# Patient Record
Sex: Female | Born: 1955
Health system: Southern US, Community
[De-identification: ages and names within clinical notes are randomized; demographics above are authoritative.]

## PROBLEM LIST (undated history)

## (undated) DIAGNOSIS — Z974 Presence of external hearing-aid: Secondary | ICD-10-CM

## (undated) DIAGNOSIS — D649 Anemia, unspecified: Secondary | ICD-10-CM

## (undated) DIAGNOSIS — R2232 Localized swelling, mass and lump, left upper limb: Secondary | ICD-10-CM

## (undated) DIAGNOSIS — Z973 Presence of spectacles and contact lenses: Secondary | ICD-10-CM

## (undated) DIAGNOSIS — Z5189 Encounter for other specified aftercare: Secondary | ICD-10-CM

## (undated) DIAGNOSIS — E559 Vitamin D deficiency, unspecified: Secondary | ICD-10-CM

## (undated) DIAGNOSIS — I10 Essential (primary) hypertension: Secondary | ICD-10-CM

## (undated) DIAGNOSIS — Z85020 Personal history of malignant carcinoid tumor of stomach: Secondary | ICD-10-CM

## (undated) DIAGNOSIS — Z860101 Personal history of adenomatous and serrated colon polyps: Secondary | ICD-10-CM

## (undated) DIAGNOSIS — Z86010 Personal history of colonic polyps: Secondary | ICD-10-CM

## (undated) DIAGNOSIS — Z87828 Personal history of other (healed) physical injury and trauma: Secondary | ICD-10-CM

## (undated) DIAGNOSIS — G4730 Sleep apnea, unspecified: Secondary | ICD-10-CM

## (undated) DIAGNOSIS — M069 Rheumatoid arthritis, unspecified: Secondary | ICD-10-CM

## (undated) DIAGNOSIS — F329 Major depressive disorder, single episode, unspecified: Secondary | ICD-10-CM

## (undated) DIAGNOSIS — E119 Type 2 diabetes mellitus without complications: Secondary | ICD-10-CM

## (undated) DIAGNOSIS — H40009 Preglaucoma, unspecified, unspecified eye: Secondary | ICD-10-CM

## (undated) DIAGNOSIS — Z85528 Personal history of other malignant neoplasm of kidney: Secondary | ICD-10-CM

## (undated) DIAGNOSIS — J45909 Unspecified asthma, uncomplicated: Secondary | ICD-10-CM

## (undated) DIAGNOSIS — F32A Depression, unspecified: Secondary | ICD-10-CM

## (undated) DIAGNOSIS — M064 Inflammatory polyarthropathy: Secondary | ICD-10-CM

## (undated) DIAGNOSIS — D61818 Other pancytopenia: Secondary | ICD-10-CM

## (undated) DIAGNOSIS — K754 Autoimmune hepatitis: Secondary | ICD-10-CM

## (undated) DIAGNOSIS — T8092XA Unspecified transfusion reaction, initial encounter: Secondary | ICD-10-CM

## (undated) DIAGNOSIS — M109 Gout, unspecified: Secondary | ICD-10-CM

## (undated) DIAGNOSIS — K219 Gastro-esophageal reflux disease without esophagitis: Secondary | ICD-10-CM

## (undated) HISTORY — DX: Sleep apnea, unspecified: G47.30

## (undated) HISTORY — DX: Vitamin D deficiency, unspecified: E55.9

## (undated) HISTORY — PX: CATARACT EXTRACTION, BILATERAL: SHX1313

## (undated) HISTORY — DX: Type 2 diabetes mellitus without complications: E11.9

## (undated) HISTORY — PX: KIDNEY SURGERY: SHX687

## (undated) HISTORY — PX: STOMACH SURGERY: SHX791

## (undated) HISTORY — PX: BREAST BIOPSY: SHX20

## (undated) HISTORY — DX: Presence of external hearing-aid: Z97.4

---
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
## 1977-02-13 HISTORY — PX: WISDOM TOOTH EXTRACTION: SHX21

## 1980-02-14 HISTORY — PX: EXCISION MORTON'S NEUROMA: SHX5013

## 1990-02-13 HISTORY — PX: TUBAL LIGATION: SHX77

## 1996-06-13 HISTORY — PX: ABDOMINAL HYSTERECTOMY: SHX81

## 2000-06-13 HISTORY — PX: LAPAROSCOPIC CHOLECYSTECTOMY: SUR755

## 2004-11-25 ENCOUNTER — Encounter: Admission: RE | Admit: 2004-11-25 | Discharge: 2004-11-25 | Payer: Self-pay | Admitting: Gastroenterology

## 2004-12-07 ENCOUNTER — Encounter: Admission: RE | Admit: 2004-12-07 | Discharge: 2004-12-07 | Payer: Self-pay | Admitting: Gastroenterology

## 2004-12-29 ENCOUNTER — Ambulatory Visit (HOSPITAL_COMMUNITY): Admission: RE | Admit: 2004-12-29 | Discharge: 2004-12-29 | Payer: Self-pay | Admitting: Urology

## 2005-02-07 ENCOUNTER — Inpatient Hospital Stay (HOSPITAL_COMMUNITY): Admission: RE | Admit: 2005-02-07 | Discharge: 2005-02-09 | Payer: Self-pay | Admitting: Urology

## 2005-02-07 ENCOUNTER — Encounter (INDEPENDENT_AMBULATORY_CARE_PROVIDER_SITE_OTHER): Payer: Self-pay | Admitting: *Deleted

## 2005-02-07 HISTORY — PX: LAPAROSCOPIC PARTIAL NEPHRECTOMY: SUR782

## 2005-08-02 ENCOUNTER — Ambulatory Visit (HOSPITAL_COMMUNITY): Admission: RE | Admit: 2005-08-02 | Discharge: 2005-08-02 | Payer: Self-pay | Admitting: Urology

## 2006-01-25 ENCOUNTER — Ambulatory Visit (HOSPITAL_COMMUNITY): Admission: RE | Admit: 2006-01-25 | Discharge: 2006-01-25 | Payer: Self-pay | Admitting: Urology

## 2006-07-24 ENCOUNTER — Ambulatory Visit (HOSPITAL_COMMUNITY): Admission: RE | Admit: 2006-07-24 | Discharge: 2006-07-24 | Payer: Self-pay | Admitting: Urology

## 2006-11-29 ENCOUNTER — Encounter: Admission: RE | Admit: 2006-11-29 | Discharge: 2006-11-29 | Payer: Self-pay | Admitting: Family Medicine

## 2006-12-31 HISTORY — PX: OTHER SURGICAL HISTORY: SHX169

## 2007-01-25 ENCOUNTER — Ambulatory Visit (HOSPITAL_COMMUNITY): Admission: RE | Admit: 2007-01-25 | Discharge: 2007-01-25 | Payer: Self-pay | Admitting: Urology

## 2007-07-26 ENCOUNTER — Ambulatory Visit (HOSPITAL_COMMUNITY): Admission: RE | Admit: 2007-07-26 | Discharge: 2007-07-26 | Payer: Self-pay | Admitting: Urology

## 2007-12-17 ENCOUNTER — Encounter: Admission: RE | Admit: 2007-12-17 | Discharge: 2007-12-17 | Payer: Self-pay | Admitting: Family Medicine

## 2008-01-28 ENCOUNTER — Ambulatory Visit (HOSPITAL_COMMUNITY): Admission: RE | Admit: 2008-01-28 | Discharge: 2008-01-28 | Payer: Self-pay | Admitting: Urology

## 2008-07-24 ENCOUNTER — Ambulatory Visit (HOSPITAL_COMMUNITY): Admission: RE | Admit: 2008-07-24 | Discharge: 2008-07-24 | Payer: Self-pay | Admitting: Urology

## 2008-12-17 ENCOUNTER — Encounter: Admission: RE | Admit: 2008-12-17 | Discharge: 2008-12-17 | Payer: Self-pay | Admitting: Family Medicine

## 2009-11-17 ENCOUNTER — Ambulatory Visit (HOSPITAL_BASED_OUTPATIENT_CLINIC_OR_DEPARTMENT_OTHER): Admission: RE | Admit: 2009-11-17 | Discharge: 2009-11-17 | Payer: Self-pay | Admitting: Orthopedic Surgery

## 2009-11-17 HISTORY — PX: OTHER SURGICAL HISTORY: SHX169

## 2009-12-02 ENCOUNTER — Emergency Department (HOSPITAL_COMMUNITY): Admission: EM | Admit: 2009-12-02 | Discharge: 2009-12-03 | Payer: Self-pay | Admitting: Emergency Medicine

## 2009-12-03 ENCOUNTER — Inpatient Hospital Stay (HOSPITAL_COMMUNITY): Admission: RE | Admit: 2009-12-03 | Discharge: 2009-12-05 | Payer: Self-pay | Admitting: Psychiatry

## 2009-12-03 ENCOUNTER — Ambulatory Visit: Payer: Self-pay | Admitting: Psychiatry

## 2010-03-05 ENCOUNTER — Encounter: Payer: Self-pay | Admitting: Family Medicine

## 2010-04-27 LAB — DIFFERENTIAL
Basophils Absolute: 0.1 10*3/uL (ref 0.0–0.1)
Basophils Relative: 1 % (ref 0–1)
Eosinophils Absolute: 0.2 10*3/uL (ref 0.0–0.7)
Eosinophils Relative: 2 % (ref 0–5)
Lymphocytes Relative: 24 % (ref 12–46)
Lymphs Abs: 3.4 10*3/uL (ref 0.7–4.0)
Monocytes Absolute: 0.7 10*3/uL (ref 0.1–1.0)
Monocytes Relative: 5 % (ref 3–12)
Neutro Abs: 9.7 10*3/uL — ABNORMAL HIGH (ref 1.7–7.7)
Neutrophils Relative %: 68 % (ref 43–77)

## 2010-04-27 LAB — URINALYSIS, ROUTINE W REFLEX MICROSCOPIC
Bilirubin Urine: NEGATIVE
Glucose, UA: NEGATIVE mg/dL
Ketones, ur: NEGATIVE mg/dL
Leukocytes, UA: NEGATIVE
Nitrite: NEGATIVE
Protein, ur: NEGATIVE mg/dL
Specific Gravity, Urine: 1.017 (ref 1.005–1.030)
Urobilinogen, UA: 0.2 mg/dL (ref 0.0–1.0)
pH: 7.5 (ref 5.0–8.0)

## 2010-04-27 LAB — BASIC METABOLIC PANEL
BUN: 12 mg/dL (ref 6–23)
CO2: 28 mEq/L (ref 19–32)
Calcium: 9.5 mg/dL (ref 8.4–10.5)
Chloride: 98 mEq/L (ref 96–112)
Creatinine, Ser: 1.04 mg/dL (ref 0.4–1.2)
GFR calc Af Amer: >60 mL/min (ref >60)
GFR calc non Af Amer: 55 mL/min — ABNORMAL LOW (ref >60)
Glucose, Bld: 135 mg/dL — ABNORMAL HIGH (ref 70–99)
Potassium: 3 mEq/L — ABNORMAL LOW (ref 3.5–5.1)
Sodium: 138 mEq/L (ref 135–145)

## 2010-04-27 LAB — URINE MICROSCOPIC-ADD ON

## 2010-04-27 LAB — RAPID URINE DRUG SCREEN, HOSP PERFORMED
Amphetamines: NOT DETECTED
Barbiturates: NOT DETECTED
Benzodiazepines: POSITIVE — AB
Cocaine: NOT DETECTED
Opiates: NOT DETECTED
Tetrahydrocannabinol: NOT DETECTED

## 2010-04-27 LAB — CBC
HCT: 37 % (ref 36.0–46.0)
Hemoglobin: 12.8 g/dL (ref 12.0–15.0)
MCH: 28.8 pg (ref 26.0–34.0)
MCHC: 34.4 g/dL (ref 30.0–36.0)
MCV: 83.6 fL (ref 78.0–100.0)
Platelets: 416 10*3/uL — ABNORMAL HIGH (ref 150–400)
RBC: 4.43 MIL/uL (ref 3.87–5.11)
RDW: 14.3 % (ref 11.5–15.5)
WBC: 14.2 10*3/uL — ABNORMAL HIGH (ref 4.0–10.5)

## 2010-04-27 LAB — ETHANOL: Alcohol, Ethyl (B): <5 mg/dL (ref 0–10)

## 2010-04-28 LAB — POCT I-STAT, CHEM 8
BUN: 9 mg/dL (ref 6–23)
Calcium, Ion: 1.16 mmol/L (ref 1.12–1.32)
Chloride: 95 mEq/L — ABNORMAL LOW (ref 96–112)
Creatinine, Ser: 1 mg/dL (ref 0.4–1.2)
Glucose, Bld: 138 mg/dL — ABNORMAL HIGH (ref 70–99)
HCT: 41 % (ref 36.0–46.0)
Hemoglobin: 13.9 g/dL (ref 12.0–15.0)
Potassium: 2.8 mEq/L — ABNORMAL LOW (ref 3.5–5.1)
Sodium: 140 mEq/L (ref 135–145)
TCO2: 36 mmol/L (ref 0–100)

## 2010-04-28 LAB — POCT HEMOGLOBIN-HEMACUE: Hemoglobin: 13 g/dL (ref 12.0–15.0)

## 2010-07-01 NOTE — Op Note (Signed)
NAMEDRUE, CAMERA              ACCOUNT NO.:  [id number]   MEDICAL RECORD NO.:  [id number]          PATIENT TYPE:  INP   LOCATION:  1405                         FACILITY:  [facility]   PHYSICIAN:  Heloise Purpura, MD      DATE OF BIRTH:  [birth year]   DATE OF PROCEDURE:  02/07/2005  DATE OF DISCHARGE:                                 OPERATIVE REPORT   PREOPERATIVE DIAGNOSIS:  Left renal mass.   POSTOPERATIVE DIAGNOSIS:  Left renal mass.   PROCEDURE:  Left laparoscopic partial nephrectomy.   SURGEON:  Dr. Heloise Purpura   ASSISTANT:  Dr. Bjorn Pippin   ANESTHESIA:  General.   ESTIMATED BLOOD LOSS:  25 mL.   SPECIMEN:  1.  Frozen section resection of resection base, negative for malignancy.  2.  Cystic left renal mass.   DRAINS:  1.  A #15 Blake drain.  2.  A 16-French Foley catheter.   INDICATION:  Ms. Laske is a 55 year old female with an incidentally  detected left renal mass.  After undergoing a negative metastatic  evaluation, we had a discussion regarding her enhancing renal mass, and she  elected to proceed with surgical resection.  Potential risks and benefits of  this procedure were discussed with the patient, and she consented.   DESCRIPTION OF PROCEDURE:  The patient was taken to the operating room, and  a general anesthetic was administered.  The patient was given preoperative  antibiotics, placed in the left modified flank position, and prepped and  draped in the usual sterile fashion.  Next,  a preoperative time out was  performed.  A site was then selected in the midline just superior to the  umbilicus for placement of the camera port.  This was placed using a  standard Hasson technique.  A 1 cm vertical incision was made in the skin  and carried down through the subcutaneous tissues with electrocautery.  The  rectus fascia was then scored, and this was noted to be just off the  midline.  The posterior rectus sheath and peritoneum was then sharply  divided,  allowing entry into the peritoneal cavity under direct vision.  Holding stitches of 0 Vicryl were then placed in the rectus fascia for the  Hasson port.  The Hasson port was then placed and secured with the holding  stitches.  A pneumoperitoneum was established, and the 30-degree lens was  used to inspect the abdomen.  There was no evidence of any intra-abdominal  injuries.  There was noted to be an adhesion between the omentum and  abdominal wall in the left upper quadrant.  Attention then turned to the  left lower quadrant.  A 12 mm port was placed under direct vision and  without difficulty. An additional 5 mm port was then placed just to the left  of midline, midway between the xiphoid process and the camera port.  This  was placed after the aforementioned adhesion was taken down with the  laparoscopic scissors.  An additional 5 mm port was then placed in the left  lateral abdominal wall.  All ports were placed under  direct vision and  without difficulty.  The white line of Toldt was then incised along the  length of the descending colon with the aid of the harmonic scalpel,  allowing the colon to be reflected medially.  The space between the colonic  mesentery and the anterior Gerota's fascia was then bluntly developed until  the ureter was identified.  The space between the ureter and the psoas  fascia was then developed, allowing the ureter to be lifted anteriorly.  Dissection then continued up towards the renal hilum where the renal vein  was encountered.  The gonadal vein was also identified and was able to be  reflected up with the kidney.  There were then noted to be 2 renal arteries  posterior to the renal vein.  These arteries were also inferior to the renal  vein.  These arteries did correspond to the patient's CT scan.  Attention  then returned to the kidney which was further mobilized superiorly and  laterally.  At this point, the harmonic scalpel was used to enter Gerota's   fascia anteriorly, allowing the perinephric fat to be mobilized off the  renal parenchyma.  The left renal mass was then identified in a position  anteriorly and laterally just below the level of the renal hilum.  This  corresponded to the lesion seen on the patient's CT scan.  The perinephric  fat was then removed all around and adjacent to this mass.  The harmonic  scalpel was then used to score the renal capsule in a circular fashion  around the mass.  The patient was administered 12.5 grams of intravenous  mannitol.  Following a period of approximately 10 minutes, laparoscopic  bulldog clamps were then placed on each of the renal arteries.  This  demonstrated appropriate renal ischemia, and laparoscopic scissors were then  used to incise the renal parenchyma around the renal mass, and it was  removed.  This mass was noted to be somewhat cystic.  After its excision, it  was placed up into the left upper quadrant above the spleen.  There appeared  to be minimal bleeding from the resection site at this point.  A deep margin  was then taken at the base of the resection site and sent off for frozen  analysis.  This demonstrated no malignancy.  Then 2-0 chromic figure-of-  eight sutures were placed in the base of the resection site.  FloSeal was  then applied to the resection base, and a Surgicel bolster was used to hold  pressure on the FloSeal.  Then 2-0 chromic sutures were used to perform  horizontal mattress sutures to secure the bolster in place.  The remaining  FloSeal was then placed over the top of the renal defect, and a free piece  of Surgicel was placed over this area.  At this time, the laparoscopic  bulldog clamps were removed.  Total warm ischemia time was 35 minutes.  The  resection site was then examined, and there did not appear to be any  bleeding.  The perinephric fat was then reapproximated over the repaired defect.  At this time, the Endopouch retrieval bag was used to  retrieve the  renal mass specimen, and it was removed via the camera port site intact  within the retrieval bag.  Zero Vicryl sutures were then placed through the  12 mm port sites for port site closure with the aid of the suture passer  device.  A #15 Blake drain was then placed through  the lateral 5 mm port  site and appropriately positioned in the retroperitoneum.  It was then  secured to the skin with a nylon suture.  All remaining ports were then  removed under direct vision.  The patient appeared to tolerate the procedure  well and without complications.  All sponge and needle counts were correct  x2 at the end of the procedure.  Quarter-percent Marcaine was then used to  inject all incision sites which were then reapproximated to the skin with 4-  0 Vicryl subcuticular closures.  Steri-Strips and sterile dressings were  applied.  The patient appeared to tolerate the procedure well and was able  to be extubated and transferred to the recovery unit in satisfactory  condition.           ______________________________  Heloise Purpura, MD  Electronically Signed     LB/MEDQ  D:  02/07/2005  T:  02/07/2005  Job:  [id number]

## 2010-07-01 NOTE — H&P (Signed)
[name], [name]              ACCOUNT NO.:  [id number]   MEDICAL RECORD NO.:  [id number]          PATIENT TYPE:  INP   LOCATION:  0004                         FACILITY:  [facility]   PHYSICIAN:  Heloise Purpura, MD      DATE OF BIRTH:  Jan 21, 1956   DATE OF ADMISSION:  02/07/2005  DATE OF DISCHARGE:                                HISTORY & PHYSICAL   CHIEF COMPLAINT:  Left renal mass.   HISTORY OF PRESENT ILLNESS:  [name] is a 55 year old female who recently  had left sided back pain. She subsequently underwent a renal ultrasound for  this pain as well as elevated liver function tests.  Her renal ultrasound  demonstrated some fatty change and a slightly enlarged liver.  Incidentally,  it also detected a 2 cm solid mass in the inner polar region of the left  kidney.  The patient therefore underwent a subsequent CT scan to fully  characterize this lesion and it did appear to enhance from 33 to 53  Hounsfield units. This was consistent with an enhancing renal mass worrisome  for possible renal malignancy.  The patient was initially evaluated by Dr.  Annabell Howells and then seen in consultation by myself for a possible laparoscopic  partial nephrectomy.  She also underwent a full metastatic evaluation that  was negative.  Her elevated liver function tests have been evaluated and are  thought to be secondary to autoimmune hepatitis.  Furthermore, she denies  any bleeding tendencies and underwent coagulation studies that were, for the  most part, within normal limits.   PAST MEDICAL HISTORY:  1.  History of elevated liver function tests thought to be secondary to      autoimmune hepatitis.  2.  Hypertension.  3.  Uterine fibroids.  4.  Cholelithiasis.  5.  Degenerative joint disease in the cervical spine.   PAST SURGICAL HISTORY:  1.  Laparoscopic cholecystectomy.  2.  Multiple breast biopsies.  3.  Trans-abdominal hysterectomy.  4.  Removal of foot neuroma.  5.  Removal of wisdom teeth.  6.  Tonsillectomy.  7.  Surgery for a head injury in the past.   MEDICATIONS:  1.  Hyzaar.  2.  Cenestin.  3.  Omeprazole.  4.  Dayquil.  5.  Benadryl.  6.  Tylenol.  7.  Patient also takes a medication for chronic diarrhea that she cannot      currently remember.   ALLERGIES:  MYCIN'S, ALOE and BEE STINGS which all cause swelling.  The  patient also has adverse reactions with PENICILLIN, namely yeast infections.  She also has problems with nausea secondary to morphine.   FAMILY HISTORY:  1.  Breast cancer.  2.  Hypertension.  3.  Type 2 diabetes.  4.  Hypercholesterolemia.  5.  Coronary artery disease.   SOCIAL HISTORY:  The patient works as a Solicitor.  She is married.  She denies  a history of tobacco use.  She drinks approximately 1 glass of alcohol per  day.   PHYSICAL EXAMINATION:  CONSTITUTIONAL:  The patient is well-nourished and  well-developed and in no acute distress.  HEENT:  Normocephalic, atraumatic. Oropharynx is clear.  NECK:  No jugular venous distention, thyromegaly or carotid bruits.  LYMPH NODES:  Survey negative.  CARDIOVASCULAR:  Regular rate and rhythm without obvious murmurs.  LUNGS:  Clear bilaterally.  ABDOMEN:  Soft, nontender, nondistended without abdominal masses or bruits.  BACK:  No costovertebral angle tenderness.  GENITOURINARY:  Deferred.  EXTREMITIES:  No edema.  NEUROLOGICAL:  Grossly intact.   IMPRESSION:  Incidentally detected left renal mass worrisome for malignancy.   PLAN:  She will undergo a left laparoscopic partial nephrectomy.  The  patient will then be admitted to the hospital for routine postoperative  care.           ______________________________  Heloise Purpura, MD  Electronically Signed     LB/MEDQ  D:  02/07/2005  T:  02/07/2005  Job:  [id number]

## 2010-07-01 NOTE — Discharge Summary (Signed)
[name], [name]              ACCOUNT NO.:  [id number]   MEDICAL RECORD NO.:  [id number]          PATIENT TYPE:  INP   LOCATION:  1405                         [facility]   PHYSICIAN:  Heloise Purpura, MD      DATE OF BIRTH:  [birth year]   DATE OF ADMISSION:  02/07/2005  DATE OF DISCHARGE:  02/09/2005                                 DISCHARGE SUMMARY   ADMISSION DIAGNOSIS:  Left renal mass.   DISCHARGE DIAGNOSIS:  Left renal mass.   HISTORY:  For full details, please see admission history and physical.  Briefly, [name] is a 55 year old female who was found to have an  incidentally detected 2 cm left renal mass that was enhancing and worrisome  for renal malignancy. A metastatic evaluation was performed and found to be  negative. She was therefore counseled regarding management options and  elected to proceed with a laparoscopic partial nephrectomy.   HOSPITAL COURSE:  After undergoing an uneventful left laparoscopic partial  nephrectomy, the patient was able to be transferred to a regular hospital  room following recovery from anesthesia. She was initially maintained on  bedrest and on postoperative day #1 was able to begin ambulating. She was  placed on a clear liquid diet and by postoperative day #2, she was  ambulating well and did have return of bowel function. Her diet was advanced  throughout the day and her retroperitoneal drain was removed after having  minimal output. Her pain was otherwise controlled well with oral pain  medication and she was able to be discharged home in good condition.   DISPOSITION:  Home.   DISCHARGE MEDICATIONS:  The patient has been given a prescription for  Vicodin to take as needed for pain. She has also been instructed to take  Colace as needed as a stool softener. She has been instructed that she may  otherwise resume her regular home medications excepting for any aspirin or  nonsteroidal anti-inflammatory drugs for a period of  time 2 weeks.   DISCHARGE INSTRUCTIONS:  The patient was instructed to refrain from any  heavy lifting or strenuous activity. She was encouraged to be ambulatory.  She is instructed on signs and symptoms of wound infection and told to call  should she have any problems.   FOLLOW-UP APPOINTMENTS:  An appointment will made for the patient to follow-  up with me next week for a postoperative check. We will plan to review the  patient's pathology at that time.           ______________________________  Heloise Purpura, MD  Electronically Signed     LB/MEDQ  D:  02/09/2005  T:  02/10/2005  Job:  [id number]   cc:   Excell Seltzer. Annabell Howells, M.D.  Fax: [phone]   Magnus Sinning. Rice, M.D.  Fax: [phone]

## 2011-06-05 ENCOUNTER — Other Ambulatory Visit: Payer: Self-pay | Admitting: Family Medicine

## 2011-06-05 DIAGNOSIS — Z1231 Encounter for screening mammogram for malignant neoplasm of breast: Secondary | ICD-10-CM

## 2011-06-15 ENCOUNTER — Ambulatory Visit
Admission: RE | Admit: 2011-06-15 | Discharge: 2011-06-15 | Disposition: A | Discharge status: Home / Self Care | Payer: BC Managed Care – PPO | Source: Ambulatory Visit | Attending: Family Medicine | Admitting: Family Medicine

## 2011-06-15 DIAGNOSIS — Z1231 Encounter for screening mammogram for malignant neoplasm of breast: Secondary | ICD-10-CM

## 2012-03-25 ENCOUNTER — Other Ambulatory Visit: Payer: Self-pay | Admitting: Gastroenterology

## 2012-03-27 ENCOUNTER — Inpatient Hospital Stay (HOSPITAL_COMMUNITY)
Admission: EM | Admit: 2012-03-27 | Discharge: 2012-03-28 | DRG: 812 | Disposition: A | Discharge status: Home / Self Care | Payer: Commercial Managed Care - PPO | Attending: Family Medicine | Admitting: Family Medicine

## 2012-03-27 ENCOUNTER — Encounter (HOSPITAL_COMMUNITY): Payer: Self-pay | Admitting: *Deleted

## 2012-03-27 DIAGNOSIS — M129 Arthropathy, unspecified: Secondary | ICD-10-CM | POA: Diagnosis present

## 2012-03-27 DIAGNOSIS — Z79899 Other long term (current) drug therapy: Secondary | ICD-10-CM

## 2012-03-27 DIAGNOSIS — M109 Gout, unspecified: Secondary | ICD-10-CM | POA: Diagnosis present

## 2012-03-27 DIAGNOSIS — IMO0002 Reserved for concepts with insufficient information to code with codable children: Secondary | ICD-10-CM

## 2012-03-27 DIAGNOSIS — I10 Essential (primary) hypertension: Secondary | ICD-10-CM | POA: Diagnosis present

## 2012-03-27 DIAGNOSIS — K754 Autoimmune hepatitis: Secondary | ICD-10-CM | POA: Diagnosis present

## 2012-03-27 DIAGNOSIS — Z85528 Personal history of other malignant neoplasm of kidney: Secondary | ICD-10-CM

## 2012-03-27 DIAGNOSIS — M069 Rheumatoid arthritis, unspecified: Secondary | ICD-10-CM | POA: Diagnosis present

## 2012-03-27 DIAGNOSIS — K922 Gastrointestinal hemorrhage, unspecified: Secondary | ICD-10-CM

## 2012-03-27 DIAGNOSIS — D649 Anemia, unspecified: Principal | ICD-10-CM | POA: Diagnosis present

## 2012-03-27 HISTORY — DX: Essential (primary) hypertension: I10

## 2012-03-27 HISTORY — DX: Rheumatoid arthritis, unspecified: M06.9

## 2012-03-27 HISTORY — DX: Autoimmune hepatitis: K75.4

## 2012-03-27 HISTORY — DX: Gout, unspecified: M10.9

## 2012-03-27 LAB — CBC WITH DIFFERENTIAL/PLATELET
Basophils Relative: 1 % (ref 0–1)
HCT: 21.2 % — ABNORMAL LOW (ref 36.0–46.0)
Hemoglobin: 7.7 g/dL — ABNORMAL LOW (ref 12.0–15.0)
Lymphs Abs: 2.2 10*3/uL (ref 0.7–4.0)
MCH: 33 pg (ref 26.0–34.0)
MCHC: 36.3 g/dL — ABNORMAL HIGH (ref 30.0–36.0)
Monocytes Absolute: 0.2 10*3/uL (ref 0.1–1.0)
Monocytes Relative: 4 % (ref 3–12)
Neutro Abs: 1.3 10*3/uL — ABNORMAL LOW (ref 1.7–7.7)
RBC: 2.33 MIL/uL — ABNORMAL LOW (ref 3.87–5.11)

## 2012-03-27 LAB — COMPREHENSIVE METABOLIC PANEL
Albumin: 3.8 g/dL (ref 3.5–5.2)
Alkaline Phosphatase: 119 U/L — ABNORMAL HIGH (ref 39–117)
BUN: 16 mg/dL (ref 6–23)
Chloride: 99 mEq/L (ref 96–112)
Creatinine, Ser: 0.75 mg/dL (ref 0.50–1.10)
GFR calc Af Amer: >90 mL/min (ref >90)
GFR calc non Af Amer: >90 mL/min (ref >90)
Glucose, Bld: 118 mg/dL — ABNORMAL HIGH (ref 70–99)
Total Bilirubin: 0.4 mg/dL (ref 0.3–1.2)

## 2012-03-27 LAB — URINALYSIS, ROUTINE W REFLEX MICROSCOPIC
Glucose, UA: NEGATIVE mg/dL
Ketones, ur: NEGATIVE mg/dL
Leukocytes, UA: NEGATIVE
Nitrite: NEGATIVE
Protein, ur: NEGATIVE mg/dL
Urobilinogen, UA: 0.2 mg/dL (ref 0.0–1.0)

## 2012-03-27 LAB — ABO/RH: ABO/RH(D): A POS

## 2012-03-27 MED ORDER — ACETAMINOPHEN 325 MG PO TABS: 650.0000 mg | ORAL_TABLET | Freq: Four times a day (QID) | ORAL | Status: DC | PRN

## 2012-03-27 MED ORDER — ZOLPIDEM TARTRATE 5 MG PO TABS: 5.0000 mg | ORAL_TABLET | Freq: Every evening | ORAL | Status: DC | PRN

## 2012-03-27 MED ORDER — SODIUM CHLORIDE 0.9 % IJ SOLN
3.0000 mL | Freq: Two times a day (BID) | INTRAMUSCULAR | Status: DC
  Administered 2012-03-27 – 2012-03-28 (×2): 3 mL via INTRAVENOUS

## 2012-03-27 MED ORDER — ONDANSETRON HCL 4 MG/2ML IJ SOLN: 4.0000 mg | Freq: Four times a day (QID) | INTRAMUSCULAR | Status: DC | PRN

## 2012-03-27 MED ORDER — ACETAMINOPHEN 650 MG RE SUPP: 650.0000 mg | Freq: Four times a day (QID) | RECTAL | Status: DC | PRN

## 2012-03-27 MED ORDER — PANTOPRAZOLE SODIUM 40 MG PO TBEC
40.0000 mg | DELAYED_RELEASE_TABLET | Freq: Every day | ORAL | Status: DC
  Administered 2012-03-27 – 2012-03-28 (×2): 40 mg via ORAL
  Filled 2012-03-27 (×2): qty 1

## 2012-03-27 MED ORDER — ACETAMINOPHEN 325 MG PO TABS
650.0000 mg | ORAL_TABLET | Freq: Once | ORAL | Status: AC
  Administered 2012-03-27: 650 mg via ORAL
  Filled 2012-03-27: qty 2

## 2012-03-27 MED ORDER — ONDANSETRON HCL 8 MG PO TABS
4.0000 mg | ORAL_TABLET | Freq: Four times a day (QID) | ORAL | Status: DC | PRN
  Filled 2012-03-27: qty 0.5

## 2012-03-27 MED ORDER — SODIUM CHLORIDE 0.9 % IV SOLN
INTRAVENOUS | Status: DC
  Administered 2012-03-27: 23:00:00 via INTRAVENOUS

## 2012-03-27 MED ORDER — PREDNISONE 5 MG PO TABS
5.0000 mg | ORAL_TABLET | Freq: Every day | ORAL | Status: DC
  Administered 2012-03-27 – 2012-03-28 (×2): 5 mg via ORAL
  Filled 2012-03-27 (×2): qty 1

## 2012-03-27 NOTE — ED Notes (Signed)
Pt ambulated to restroom with no issues. Gave urine sample.

## 2012-03-27 NOTE — ED Provider Notes (Signed)
History     CSN: [id number]  Arrival date & time 03/27/12  1448   First MD Initiated Contact with Patient 03/27/12 1459      Chief Complaint  Patient presents with  . Fatigue  . Anemia    (Consider location/radiation/quality/duration/timing/severity/associated sxs/prior treatment) HPI  The patient presents with concerns of fatigue.  Notably, the patient has no complaints at rest, but states that with exertion, minimal activity she has lightheadedness, ED fatigability.  There is no associated pain, nor significant dyspnea with exertion, and none of these at rest. The patient is currently being evaluated for anemia, and is scheduled for colonoscopy tomorrow. She denies new changes in bowel habits, or any emesis.  At baseline the patient has Hemoccult-positive stool, as recently evaluated by her gastroenterologist. She denies other new focal changes, such as disorientation, confusion, syncope, visual changes, chest pain, belly pain, melena or frank red blood per rectum.  Past Medical History  Diagnosis Date  . Arthritis   . Autoimmune hepatitis   . Hypertension   . Gout     History reviewed. No pertinent past surgical history.  History reviewed. No pertinent family history.  History  Substance Use Topics  . Smoking status: Not on file  . Smokeless tobacco: Not on file  . Alcohol Use: Yes     Comment: occ    OB History   Grav Para Term Preterm Abortions TAB SAB Ect Mult Living                  Review of Systems  Constitutional:       Per HPI, otherwise negative  HENT:       Per HPI, otherwise negative  Respiratory:       Per HPI, otherwise negative  Cardiovascular:       Per HPI, otherwise negative  Gastrointestinal: Negative for vomiting.  Endocrine:       Negative aside from HPI  Genitourinary:       Neg aside from HPI   Musculoskeletal:       Per HPI, otherwise negative  Skin: Negative.   Neurological: Negative for syncope.    Allergies  Aspirin;  Morphine and related; Other; Penicillins; Prozac; and Aloe  Home Medications   Current Outpatient Rx  Name  Route  Sig  Dispense  Refill  . adalimumab (HUMIRA) 40 MG/0.8ML injection   Subcutaneous   Inject 40 mg into the skin once a week.         Marland Kitchen allopurinol (ZYLOPRIM) 300 MG tablet   Oral   Take 300 mg by mouth daily.         Marland Kitchen azaTHIOprine (IMURAN) 50 MG tablet   Oral   Take 50-100 mg by mouth daily. 2 tablets in the morning 1 tablet in the evening         . colchicine 0.6 MG tablet   Oral   Take 0.6 mg by mouth daily.         . cyclobenzaprine (FLEXERIL) 10 MG tablet   Oral   Take 10 mg by mouth 3 (three) times daily as needed for muscle spasms.         Marland Kitchen esomeprazole (NEXIUM) 40 MG capsule   Oral   Take 40 mg by mouth 2 (two) times daily.         . furosemide (LASIX) 20 MG tablet   Oral   Take 20 mg by mouth daily.         Marland Kitchen losartan-hydrochlorothiazide (  HYZAAR) 100-25 MG per tablet   Oral   Take 1 tablet by mouth every morning.         . potassium chloride SA (K-DUR,KLOR-CON) 20 MEQ tablet   Oral   Take 20 mEq by mouth daily.         . predniSONE (DELTASONE) 5 MG tablet   Oral   Take 5 mg by mouth daily.           BP 132/77  Pulse 118  Temp(Src) 98.2 F (36.8 C) (Oral)  Resp 18  SpO2 96%  Physical Exam  Nursing note and vitals reviewed. Constitutional: She is oriented to person, place, and time. She appears well-developed and well-nourished. No distress.  HENT:  Head: Normocephalic and atraumatic.  Eyes: Conjunctivae and EOM are normal.  Cardiovascular: Normal rate and regular rhythm.   Pulmonary/Chest: Effort normal and breath sounds normal. No stridor. No respiratory distress.  Abdominal: She exhibits no distension.  Musculoskeletal: She exhibits no edema.  Neurological: She is alert and oriented to person, place, and time. No cranial nerve deficit.  Skin: Skin is warm and dry.  Psychiatric: She has a normal mood and  affect.   Patient defers rectal exam - reasonable as she has known occult positive samples within the past week.  ED Course  Procedures (including critical care time)  Labs Reviewed  CBC WITH DIFFERENTIAL  COMPREHENSIVE METABOLIC PANEL  URINALYSIS, ROUTINE W REFLEX MICROSCOPIC   No results found.   No diagnosis found.  I reviewed the labs with the patient and her husband.  Given the decrease in hemoglobin to 7.7, down from 8.2 2 days ago, and 8.6 10 days ago, she will be transfused for symptomatic anemia  MDM  This patient presents one day prior to a planned colonoscopy for evaluation of anemia, GI bleed, now with increasing fatigue.  On exam the patient is mentating well, though she is at rest.  Given the patient's endorsement of increasingly symptomatic changes, her decreased hemoglobin, she will receive blood transfusion, be admitted for observation for this process.        Gerhard Munch, MD 03/27/12 [phone]
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
## 2012-03-27 NOTE — ED Notes (Signed)
Lab at bedside

## 2012-03-27 NOTE — ED Notes (Signed)
Pt in nad, skin warm and dry, resp e/u. Pt denies itchiness at PIV site. Pt negative for bld transfusion reaction.

## 2012-03-27 NOTE — ED Notes (Signed)
Pt reports already being scheduled for colonoscopy tomorrow due to low hemoglobin but having increase in fatigue and is afraid that hgb has decreased more. No acute distress noted at this time.
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
## 2012-03-28 DIAGNOSIS — D649 Anemia, unspecified: Secondary | ICD-10-CM

## 2012-03-28 DIAGNOSIS — K922 Gastrointestinal hemorrhage, unspecified: Secondary | ICD-10-CM

## 2012-03-28 DIAGNOSIS — I10 Essential (primary) hypertension: Secondary | ICD-10-CM

## 2012-03-28 LAB — TYPE AND SCREEN
ABO/RH(D): A POS
Unit division: 0

## 2012-03-28 LAB — CBC
HCT: 25.3 % — ABNORMAL LOW (ref 36.0–46.0)
Hemoglobin: 9.2 g/dL — ABNORMAL LOW (ref 12.0–15.0)
MCHC: 36.4 g/dL — ABNORMAL HIGH (ref 30.0–36.0)
MCV: 89.1 fL (ref 78.0–100.0)
RDW: 19.1 % — ABNORMAL HIGH (ref 11.5–15.5)

## 2012-03-28 LAB — BASIC METABOLIC PANEL
BUN: 14 mg/dL (ref 6–23)
Chloride: 101 mEq/L (ref 96–112)
Creatinine, Ser: 0.82 mg/dL (ref 0.50–1.10)
GFR calc non Af Amer: 79 mL/min — ABNORMAL LOW (ref >90)
Glucose, Bld: 121 mg/dL — ABNORMAL HIGH (ref 70–99)
Potassium: 3.1 mEq/L — ABNORMAL LOW (ref 3.5–5.1)

## 2012-03-28 NOTE — H&P (Signed)
FMTS Attending Daily Note: Jeylin Woodmansee MD [phone] pager office [phone] I  have seen and examined this patient, reviewed their chart. I have discussed this patient with the resident. I agree with the resident's findings, assessment and care plan. 
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
## 2012-03-28 NOTE — Discharge Summary (Signed)
Family Medicine Teaching Service  Discharge Note : Attending Maika Kaczmarek MD Pager [phone] Office [phone] I have seen and examined this patient, reviewed their chart and discussed discharge planning wit the resident at the time of discharge. I agree with the discharge plan as above.  

## 2012-03-28 NOTE — Progress Notes (Signed)
FMTS Attending Daily Note: Denny Levy MD [phone] pager office [phone] I  have seen and examined this patient, reviewed their chart. I have discussed this patient with the resident. I agree with the resident's findings, assessment and care plan.Patient stable and her GI physicians plan outpatient colonoscopy. Improved symptomatically.

## 2012-03-28 NOTE — Progress Notes (Signed)
Family Medicine Teaching Service Daily Progress Note Service Pager: [phone]  Subjective: Patient says she is feeling much better, says she has less fatigue when up walking.  She says she had a bowel movement and did not see any blood in it.    Objective: Vital signs in last 24 hours: Filed Vitals:   03/27/12 2315 03/28/12 0015 03/28/12 0100 03/28/12 0623  BP: 124/77 112/71 115/72 99/68  Pulse: 84 88 87 88  Temp: 97.8 F (36.6 C) 98.9 F (37.2 C) 98.3 F (36.8 C) 98.3 F (36.8 C)  TempSrc: Oral Oral Oral Oral  Resp: 18 18 18 18   Height:      Weight:    247 lb 3.2 oz (112.129 kg)  SpO2: 96% 95% 94% 95%   Weight change:   Intake/Output Summary (Last 24 hours) at 03/28/12 0814 Last data filed at 03/27/12 2200  Gross per 24 hour  Intake  262.5 ml  Output      0 ml  Net  262.5 ml   General appearance: alert, cooperative and no distress, obese Eyes: PERRL Throat: oral mucosa moist Lungs: clear to auscultation bilaterally  Heart: regular rate and rhythm, S1, S2 normal, no murmur, click, rub or gallop  Abdomen: obese, soft, non-tender; bowel sounds normal; no masses, no organomegaly  Extremities: extremities normal, atraumatic, no cyanosis or edema  Pulses: 2+ and symmetric  Lab Results:  Recent Labs  03/27/12 1520 03/28/12 0315  WBC 3.8* 3.3*  HGB 7.7* 9.2*  HCT 21.2* 25.3*  PLT 339 268    Recent Labs  03/27/12 1520 03/28/12 0315  NA 137 138  K 3.5 3.1*  CL 99 101  CO2 24 26  GLUCOSE 118* 121*  BUN 16 14  CREATININE 0.75 0.82  CALCIUM 10.0 9.2   Medications: I have reviewed the patient's current medications. Scheduled Meds: . pantoprazole  40 mg Oral Daily  . predniSONE  5 mg Oral Daily  . sodium chloride  3 mL Intravenous Q12H   Continuous Infusions:  PRN Meds:.acetaminophen, acetaminophen, ondansetron (ZOFRAN) IV, ondansetron, zolpidem Assessment/Plan: [name] is a 57 y.o. female with PMHx of RA, autoimmune hepatitis, and heme + stools who  presents with symptomatic anemia, concern slow for GI bleed:   # Anemia:  - Hemoglobin improved to 9.2 with transfusion, and patient is symptomatically improved.  - I called and discussed patient with Dr. Dorena Cookey of [facility] and he felt that since patient is not having signs of hemorrhage she could be discharged and is stable to have outpatient colonoscopy re-scheduled.   #Hx of HTN: relatively low BP's will hold home BP medications at discharge, these may need to be re-started later #Autoimmune hepatitis and Rheumatoid arthritis: continue home medications # FEN/GI: Heart healthy diet # Code Status: DNR #PPx: SQ Heparin  #Disposition: Discharge home with close follow up at [facility].    LOS: 1 day   Fleming Prill 03/28/2012, 8:14 AM

## 2012-03-28 NOTE — Progress Notes (Signed)
Utilization review completed.  

## 2012-04-01 ENCOUNTER — Encounter (HOSPITAL_COMMUNITY): Payer: BC Managed Care – PPO

## 2012-04-03 ENCOUNTER — Other Ambulatory Visit: Payer: Self-pay | Admitting: Gastroenterology

## 2012-04-04 ENCOUNTER — Other Ambulatory Visit (HOSPITAL_COMMUNITY): Payer: Self-pay | Admitting: Gastroenterology

## 2012-04-04 DIAGNOSIS — R1900 Intra-abdominal and pelvic swelling, mass and lump, unspecified site: Secondary | ICD-10-CM

## 2012-04-09 ENCOUNTER — Ambulatory Visit (HOSPITAL_COMMUNITY)
Admission: RE | Admit: 2012-04-09 | Discharge: 2012-04-09 | Disposition: A | Discharge status: Home / Self Care | Payer: Commercial Managed Care - PPO | Source: Ambulatory Visit | Attending: Gastroenterology | Admitting: Gastroenterology

## 2012-04-09 DIAGNOSIS — R1900 Intra-abdominal and pelvic swelling, mass and lump, unspecified site: Secondary | ICD-10-CM | POA: Insufficient documentation

## 2012-04-09 DIAGNOSIS — D509 Iron deficiency anemia, unspecified: Secondary | ICD-10-CM | POA: Insufficient documentation

## 2012-04-09 MED ORDER — IOHEXOL 300 MG/ML  SOLN
100.0000 mL | Freq: Once | INTRAMUSCULAR | Status: AC | PRN
  Administered 2012-04-09: 100 mL via INTRAVENOUS

## 2012-04-09 MED ORDER — IOHEXOL 300 MG/ML  SOLN: 100.0000 mL | Freq: Once | INTRAMUSCULAR | Status: AC | PRN

## 2012-04-11 ENCOUNTER — Telehealth: Payer: Self-pay | Admitting: Internal Medicine

## 2012-04-11 ENCOUNTER — Encounter (HOSPITAL_COMMUNITY): Payer: Self-pay | Admitting: *Deleted

## 2012-04-11 NOTE — Telephone Encounter (Signed)
C/D 04/11/12 for appt. 04/23/12 °

## 2012-04-11 NOTE — Pre-Procedure Instructions (Signed)
Your procedure is scheduled on:Wednesday, April 17, 2012 Report to [facility] Admitting [id number] Call this number if you have problems morning of your procedure:[phone]  Follow all bowel prep instructions per your doctor's orders.  Do not eat or drink anything after midnight the night before your procedure. You may brush your teeth, rinse out your mouth, but no water, no food, no chewing gum, no mints, no candies, no chewing tobacco.     Take these medicines the morning of your procedure with A SIP OF WATER:Nexium and Prednisone   Please make arrangements for a responsible person to drive you home after the procedure. You cannot go home by cab/taxi. We recommend you have someone with you at home the first 24 hours after your procedure. Driver for procedure is spouse [name] [phone]  LEAVE ALL VALUABLES, JEWELRY, BILLFOLD AT HOME.  NO DENTURES, CONTACT LENSES ALLOWED IN THE ENDOSCOPY ROOM.   YOU MAY WEAR DEODORANT, PLEASE REMOVE ALL JEWELRY, WATCHES RINGS, BODY PIERCINGS AND LEAVE AT HOME.   WOMEN: NO MAKE-UP, LOTIONS PERFUMES

## 2012-04-11 NOTE — Telephone Encounter (Signed)
S/W pt in re NP appt 03/11 @ 1:30 w/Dr. Arbutus Ped. Referring Dr. Randa Evens Dx- Anemia Welcome packet emailed.

## 2012-04-12 ENCOUNTER — Encounter (HOSPITAL_COMMUNITY): Payer: Self-pay | Admitting: Pharmacy Technician

## 2012-04-15 ENCOUNTER — Encounter (HOSPITAL_COMMUNITY): Payer: Commercial Managed Care - PPO

## 2012-04-17 ENCOUNTER — Encounter (HOSPITAL_COMMUNITY)
Admission: RE | Disposition: A | Discharge status: Home / Self Care | Payer: Self-pay | Source: Ambulatory Visit | Attending: Gastroenterology

## 2012-04-17 ENCOUNTER — Ambulatory Visit (HOSPITAL_COMMUNITY)
Admission: RE | Admit: 2012-04-17 | Discharge: 2012-04-17 | Disposition: A | Discharge status: Home / Self Care | Payer: Commercial Managed Care - PPO | Source: Ambulatory Visit | Attending: Gastroenterology | Admitting: Gastroenterology

## 2012-04-17 ENCOUNTER — Encounter (HOSPITAL_COMMUNITY): Payer: Self-pay

## 2012-04-17 ENCOUNTER — Encounter (HOSPITAL_COMMUNITY): Payer: Self-pay | Admitting: Anesthesiology

## 2012-04-17 ENCOUNTER — Ambulatory Visit (HOSPITAL_COMMUNITY): Payer: Commercial Managed Care - PPO | Admitting: Anesthesiology

## 2012-04-17 DIAGNOSIS — D649 Anemia, unspecified: Secondary | ICD-10-CM | POA: Insufficient documentation

## 2012-04-17 DIAGNOSIS — Z85528 Personal history of other malignant neoplasm of kidney: Secondary | ICD-10-CM | POA: Insufficient documentation

## 2012-04-17 DIAGNOSIS — I10 Essential (primary) hypertension: Secondary | ICD-10-CM | POA: Insufficient documentation

## 2012-04-17 DIAGNOSIS — M069 Rheumatoid arthritis, unspecified: Secondary | ICD-10-CM | POA: Insufficient documentation

## 2012-04-17 DIAGNOSIS — M109 Gout, unspecified: Secondary | ICD-10-CM | POA: Insufficient documentation

## 2012-04-17 DIAGNOSIS — K754 Autoimmune hepatitis: Secondary | ICD-10-CM | POA: Insufficient documentation

## 2012-04-17 DIAGNOSIS — K319 Disease of stomach and duodenum, unspecified: Secondary | ICD-10-CM | POA: Insufficient documentation

## 2012-04-17 HISTORY — DX: Gastro-esophageal reflux disease without esophagitis: K21.9

## 2012-04-17 HISTORY — PX: EUS: SHX5427

## 2012-04-17 SURGERY — ESOPHAGEAL ENDOSCOPIC ULTRASOUND (EUS) RADIAL
Anesthesia: Monitor Anesthesia Care

## 2012-04-17 MED ORDER — FENTANYL CITRATE 0.05 MG/ML IJ SOLN
INTRAMUSCULAR | Status: DC | PRN
  Administered 2012-04-17: 100 ug via INTRAVENOUS

## 2012-04-17 MED ORDER — BUTAMBEN-TETRACAINE-BENZOCAINE 2-2-14 % EX AERO
INHALATION_SPRAY | CUTANEOUS | Status: DC | PRN
  Administered 2012-04-17: 2 via TOPICAL

## 2012-04-17 MED ORDER — SODIUM CHLORIDE 0.9 % IV SOLN: INTRAVENOUS | Status: DC

## 2012-04-17 MED ORDER — LIDOCAINE HCL (CARDIAC) 20 MG/ML IV SOLN
INTRAVENOUS | Status: DC | PRN
  Administered 2012-04-17: 100 mg via INTRAVENOUS

## 2012-04-17 MED ORDER — KETAMINE HCL 10 MG/ML IJ SOLN
INTRAMUSCULAR | Status: DC | PRN
  Administered 2012-04-17: 30 mg via INTRAVENOUS

## 2012-04-17 MED ORDER — MIDAZOLAM HCL 5 MG/5ML IJ SOLN
INTRAMUSCULAR | Status: DC | PRN
  Administered 2012-04-17: 2 mg via INTRAVENOUS
  Administered 2012-04-17 (×2): 1 mg via INTRAVENOUS

## 2012-04-17 MED ORDER — PROPOFOL INFUSION 10 MG/ML OPTIME
INTRAVENOUS | Status: DC | PRN
  Administered 2012-04-17: 160 ug/kg/min via INTRAVENOUS

## 2012-04-17 MED ORDER — LACTATED RINGERS IV SOLN
INTRAVENOUS | Status: DC
  Administered 2012-04-17: 09:00:00 via INTRAVENOUS

## 2012-04-17 MED ORDER — ONDANSETRON HCL 4 MG/2ML IJ SOLN
INTRAMUSCULAR | Status: DC | PRN
  Administered 2012-04-17: 4 mg via INTRAVENOUS

## 2012-04-17 NOTE — Interval H&P Note (Signed)
History and Physical Interval Note:  04/17/2012 10:19 AM  [name]  has presented today for surgery, with the diagnosis of submucosal gastric mass  The various methods of treatment have been discussed with the patient and family. After consideration of risks, benefits and other options for treatment, the patient has consented to  Procedure(s): ESOPHAGEAL ENDOSCOPIC ULTRASOUND (EUS) RADIAL (N/A) as a surgical intervention .  The patient's history has been reviewed, patient examined, no change in status, stable for surgery.  I have reviewed the patient's chart and labs.  Questions were answered to the patient's satisfaction.    Assessment:  1.  Gastric submucosal nodule. 2.  Anemia.  Plan:  1.  Endoscopic ultrasound with possible biopsy (fine needle aspiration). 2.  Risks (bleeding, infection, bowel perforation that could require surgery, sedation-related changes in cardiopulmonary systems), benefits (identification and possible treatment of source of symptoms, exclusion of certain causes of symptoms), and alternatives (watchful waiting, radiographic imaging studies, empiric medical treatment) of upper endoscopy with ultrasound and possible biopsy (EUS +/- FNA) were explained to patient/family in detail and patient wishes to proceed.  Freddy Jaksch

## 2012-04-17 NOTE — Transfer of Care (Signed)
Immediate Anesthesia Transfer of Care Note  Patient: [name]  Procedure(s) Performed: Procedure(s) (LRB): ESOPHAGEAL ENDOSCOPIC ULTRASOUND (EUS) RADIAL (N/A)  Patient Location: PACU  Anesthesia Type: MAC  Level of Consciousness: sedated, patient cooperative and responds to stimulaton  Airway & Oxygen Therapy: Patient Spontanous Breathing and Patient connected to face mask oxgen  Post-op Assessment: Report given to PACU RN and Post -op Vital signs reviewed and stable  Post vital signs: Reviewed and stable  Complications: No apparent anesthesia complications

## 2012-04-17 NOTE — Anesthesia Preprocedure Evaluation (Signed)
Anesthesia Evaluation  Patient identified by MRN, date of birth, ID band Patient awake    Reviewed: Allergy & Precautions, H&P , NPO status , Patient's Chart, lab work & pertinent test results  Airway Mallampati: II TM Distance: >3 FB Neck ROM: Full    Dental no notable dental hx.    Pulmonary neg pulmonary ROS,  breath sounds clear to auscultation  Pulmonary exam normal       Cardiovascular hypertension, negative cardio ROS  Rhythm:Regular Rate:Normal     Neuro/Psych negative neurological ROS  negative psych ROS   GI/Hepatic negative GI ROS, Neg liver ROS, (+) Hepatitis -, Autoimmune  Endo/Other  negative endocrine ROSMorbid obesity  Renal/GU Renal diseasenegative Renal ROS  negative genitourinary   Musculoskeletal negative musculoskeletal ROS (+) Arthritis -, Rheumatoid disorders,    Abdominal   Peds negative pediatric ROS (+)  Hematology negative hematology ROS (+)   Anesthesia Other Findings upper front tooth bonded  Reproductive/Obstetrics negative OB ROS                           Anesthesia Physical Anesthesia Plan  ASA: III  Anesthesia Plan: MAC   Post-op Pain Management:    Induction: Intravenous  Airway Management Planned: Mask  Additional Equipment:   Intra-op Plan:   Post-operative Plan:   Informed Consent: I have reviewed the patients History and Physical, chart, labs and discussed the procedure including the risks, benefits and alternatives for the proposed anesthesia with the patient or authorized representative who has indicated his/her understanding and acceptance.   Dental advisory given  Plan Discussed with: CRNA  Anesthesia Plan Comments:         Anesthesia Quick Evaluation

## 2012-04-17 NOTE — Preoperative (Signed)
Beta Blockers   Reason not to administer Beta Blockers:Not Applicable 

## 2012-04-17 NOTE — Op Note (Signed)
[facility] [address] [place], [address]   ENDOSCOPIC ULTRASOUND PROCEDURE REPORT  PATIENT: [name], [name]  MR#: [id number] BIRTHDATE: Jul 29, 1955  GENDER: Female ENDOSCOPIST: Willis Modena, MD REFERRED BY:  Carman Ching, M.D.  [name], M.D. PROCEDURE DATE:  04/17/2012 PROCEDURE:   Upper EUS w/FNA ASA CLASS:      Class III INDICATIONS:   1.  anemia, gastric nodule. MEDICATIONS: MAC sedation, administered by CRNA and Cetacaine spray x 2  DESCRIPTION OF PROCEDURE:   After the risks benefits and alternatives of the procedure were  explained, informed consent was obtained. The patient was then placed in the left, lateral, decubitus postion and IV sedation was administered. Throughout the procedure, the patients blood pressure, pulse and oxygen saturations were monitored continuously.  Under direct visualization, the Pentax EUS Radial [id number]  endoscope was introduced through the mouth  and advanced to the stomach antrum . Water was used as necessary to provide an acoustic interface.  Upon completion of the imaging, water was removed and the patient was sent to the recovery room in satisfactory condition.    FINDINGS:      Endoscopically, a submucosal-appearing gastric nodule seen in pre-pyloric antrum; overlying mucosa normal without ulceration.  On EUS, lesion was readily seen.  It measures approximately 23 x 24mm in diameter, is round, well-defined and principally hypoechoic in  nature with a few anechoic spaces. Lesion clearly arises from the muscularis propria.  Abundant intralesional blood supply.  Lesion was biopsied (FNA, 19g, 4 passes, 2 with and 2 without suction) under EUS guidance.  There were no immediate complications.  IMPRESSION:     Submucosal gastric mass, most typical of GIST (leiomyoma less favored), biopsies obtained.  RECOMMENDATIONS:     1.  Watch for potential complications of procedure. 2.  Await biopsy results. 3.   Pending biopsy results, given lesion's size and my clinical suspicion for GI stromal tumor, will likely need surgical consultation for consideration of wedge gastric resection of this lesion. 4.  Will discuss with Dr. Randa Evens.   _______________________________ Rosalie DoctorWillis Modena, MD 04/17/2012 11:15 AM   CC:
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
## 2012-04-17 NOTE — Anesthesia Postprocedure Evaluation (Signed)
  Anesthesia Post-op Note  Patient: [name]  Procedure(s) Performed: Procedure(s) (LRB): ESOPHAGEAL ENDOSCOPIC ULTRASOUND (EUS) RADIAL (N/A)  Patient Location: PACU  Anesthesia Type: MAC  Level of Consciousness: awake and alert   Airway and Oxygen Therapy: Patient Spontanous Breathing  Post-op Pain: mild  Post-op Assessment: Post-op Vital signs reviewed, Patient's Cardiovascular Status Stable, Respiratory Function Stable, Patent Airway and No signs of Nausea or vomiting  Last Vitals:  Filed Vitals:   04/17/12 1146  BP: 152/83  Temp:   Resp: 10    Post-op Vital Signs: stable   Complications: No apparent anesthesia complications

## 2012-04-18 ENCOUNTER — Encounter (HOSPITAL_COMMUNITY): Payer: Self-pay | Admitting: Gastroenterology

## 2012-04-22 ENCOUNTER — Other Ambulatory Visit: Payer: Self-pay | Admitting: Medical Oncology

## 2012-04-22 DIAGNOSIS — D649 Anemia, unspecified: Secondary | ICD-10-CM

## 2012-04-23 ENCOUNTER — Encounter: Payer: Self-pay | Admitting: Internal Medicine

## 2012-04-23 ENCOUNTER — Ambulatory Visit (HOSPITAL_BASED_OUTPATIENT_CLINIC_OR_DEPARTMENT_OTHER): Payer: Commercial Managed Care - PPO | Admitting: Internal Medicine

## 2012-04-23 ENCOUNTER — Ambulatory Visit (HOSPITAL_BASED_OUTPATIENT_CLINIC_OR_DEPARTMENT_OTHER): Payer: Commercial Managed Care - PPO

## 2012-04-23 ENCOUNTER — Telehealth: Payer: Self-pay | Admitting: Internal Medicine

## 2012-04-23 ENCOUNTER — Other Ambulatory Visit (HOSPITAL_BASED_OUTPATIENT_CLINIC_OR_DEPARTMENT_OTHER): Payer: Commercial Managed Care - PPO | Admitting: Lab

## 2012-04-23 VITALS — BP 134/81 | HR 98 | Temp 99.1°F | Resp 18 | Ht 61.0 in | Wt 246.4 lb

## 2012-04-23 DIAGNOSIS — D539 Nutritional anemia, unspecified: Secondary | ICD-10-CM

## 2012-04-23 DIAGNOSIS — D649 Anemia, unspecified: Secondary | ICD-10-CM

## 2012-04-23 DIAGNOSIS — D72819 Decreased white blood cell count, unspecified: Secondary | ICD-10-CM

## 2012-04-23 LAB — COMPREHENSIVE METABOLIC PANEL (CC13)
AST: 36 U/L — ABNORMAL HIGH (ref 5–34)
Albumin: 3.7 g/dL (ref 3.5–5.0)
BUN: 12.7 mg/dL (ref 7.0–26.0)
Calcium: 9.4 mg/dL (ref 8.4–10.4)
Chloride: 106 mEq/L (ref 98–107)
Creatinine: 0.9 mg/dL (ref 0.6–1.1)
Glucose: 140 mg/dl — ABNORMAL HIGH (ref 70–99)
Potassium: 3.8 mEq/L (ref 3.5–5.1)

## 2012-04-23 LAB — CBC WITH DIFFERENTIAL/PLATELET
BASO%: 1.1 % (ref 0.0–2.0)
EOS%: 3 % (ref 0.0–7.0)
LYMPH%: 47.5 % (ref 14.0–49.7)
MCH: 35.3 pg — ABNORMAL HIGH (ref 25.1–34.0)
MCHC: 36.4 g/dL — ABNORMAL HIGH (ref 31.5–36.0)
MONO#: 0.1 10*3/uL (ref 0.1–0.9)
NEUT%: 43.9 % (ref 38.4–76.8)
RBC: 2.25 10*6/uL — ABNORMAL LOW (ref 3.70–5.45)
WBC: 2.5 10*3/uL — ABNORMAL LOW (ref 3.9–10.3)
lymph#: 1.2 10*3/uL (ref 0.9–3.3)

## 2012-04-23 NOTE — Progress Notes (Signed)
[facility] [phone]   [phone]  CONSULT NOTE  REFERRING PHYSICIAN: Dr. Ross Ludwig  REASON FOR CONSULTATION:  57 years old white female with persistent anemia  HPI [name] is a 57 y.o. female was past medical history significant for multiple medical problems including rheumatoid arthritis, autoimmune hepatitis, gout, hypertension, history of kidney cancer status post partial nephrectomy. The patient has been complaining of increasing fatigue and weakness recently. She underwent gastrointestinal evaluation by Dr. Randa Evens including upper endoscopy that was negative for ulcer or bleeding.  She also underwent colonoscopy recently with removal of one polyp that was consistent with an adenoma. She was recently admitted to [facility] with slow GI bleed. She recently received 2 units of packed rbc's transfusion. CT of the abdomen and pelvis with contrast (CT enterography showed no explanation for anemia/GI bleeding identified. It also showed 2.0 x 2.0 CM enhancing Mass in the wall of the posterior gastric antrum suspicious for benign GIST, Leiomyoma or schwannoma. On 04/18/2011 the patient underwent upper endoscopy with endoscopic ultrasound and fine-needle aspiration which showed submucosal gastric mass, most typical of GIST.  The final cytology showed atypical cells. The specimen to is a scant, indicating optimal cytologic evaluation. However there was a focus of Gleason epithelioid cells present with neuroendocrine characteristic. The cells were faintly positive for synaptophysin and negative for chromogranin, CD 56, CD117 and CD 34. A carcinoid tumor cannot be entirely ruled out based on these findings. Cytologic features of gastrointestinal stromal tumor were not identified. She did not have a referral to surgical evaluation yet. The patient was referred to me today for evaluation of her anemia. She still complaining of fatigue and weakness. She  sleeps a lot and also have to take some breast when she goes upstairs at her workplace. She has shortness breath with exertion as well as heartburn with mild nausea but no vomiting. Her bowel movements are loose all the time. She denied having any other significant complaints.  Her family history is significant for her mother who is still alive and has a history of breast cancer and maternal grandmother with colon cancer. The patient is married and has one son. She was accompanied by her husband [name]. She works as a Company secretary. She has no history of smoking and drinks alcohol occasionally with no history of drug abuse. @SFHPI @  Past Medical History  Diagnosis Date  . Hypertension   . Gout   . GERD (gastroesophageal reflux disease)   . History of kidney cancer   . Autoimmune hepatitis   . Rheumatoid arthritis     Past Surgical History  Procedure Laterality Date  . Abdominal hysterectomy    . Cholecystectomy, laparoscopic    . Partial nephrectomy    . Eus N/A 04/17/2012    Procedure: ESOPHAGEAL ENDOSCOPIC ULTRASOUND (EUS) RADIAL;  Surgeon: Willis Modena, MD;  Location: [facility];  Service: Endoscopy;  Laterality: N/A;    Family History  Problem Relation Age of Onset  . Heart failure Father   . Diabetes Father   . Diverticulitis Mother     Social History History  Substance Use Topics  . Smoking status: Never Smoker   . Smokeless tobacco: Never Used  . Alcohol Use: 0.0 oz/week    0 drink(s) per week     Comment: occ    Allergies  Allergen Reactions  . Aspirin Nausea Only  . Latex Other (See Comments)    sensitivity  . Lisinopril Cough  .  Methotrexate Derivatives Other (See Comments)    Elevated liver enzymes  . Morphine And Related Nausea And Vomiting  . Other Swelling    All mycins-swelling  BP-unsure of name -cough  . Penicillins Other (See Comments)    Yeast infection  . Prozac (Fluoxetine Hcl) Other (See Comments)    unknown  . Aloe Rash    Burn rash   . Lactose Intolerance (Gi)     Current Outpatient Prescriptions  Medication Sig Dispense Refill  . allopurinol (ZYLOPRIM) 300 MG tablet Take 300 mg by mouth daily.      Marland Kitchen azaTHIOprine (IMURAN) 50 MG tablet Take 50-100 mg by mouth daily. 2 tablets in the morning 1 tablet in the evening      . colchicine 0.6 MG tablet Take 0.6 mg by mouth daily.      . cyclobenzaprine (FLEXERIL) 10 MG tablet Take 10 mg by mouth 3 (three) times daily as needed for muscle spasms.      Marland Kitchen esomeprazole (NEXIUM) 40 MG capsule Take 40 mg by mouth 2 (two) times daily.      . predniSONE (DELTASONE) 5 MG tablet Take 5 mg by mouth daily.      Marland Kitchen adalimumab (HUMIRA) 40 MG/0.8ML injection Inject 40 mg into the skin once a week.      Marland Kitchen HYDROcodone-acetaminophen (NORCO/VICODIN) 5-325 MG per tablet Take 1 tablet by mouth every 6 (six) hours as needed for pain.      Marland Kitchen oxycodone-acetaminophen (LYNOX) 10-300 MG per tablet Take 1 tablet by mouth every 4 (four) hours as needed for pain.       No current facility-administered medications for this visit.    Review of Systems  A comprehensive review of systems was negative except for: Constitutional: positive for anorexia and fatigue Respiratory: positive for dyspnea on exertion Gastrointestinal: positive for diarrhea and nausea  Physical Exam  OZH:YQMVH, healthy, no distress, well nourished and well developed SKIN: skin color, texture, turgor are normal HEAD: Normocephalic EYES: normal, PERRLA EARS: External ears normal OROPHARYNX:no exudate and no erythema  NECK: supple, no adenopathy LYMPH:  no palpable lymphadenopathy, no hepatosplenomegaly BREAST:not examined LUNGS: clear to auscultation  HEART: regular rate & rhythm and no murmurs ABDOMEN:abdomen soft, non-tender, obese, normal bowel sounds and no masses or organomegaly BACK: Back symmetric, no curvature. EXTREMITIES:no joint deformities, effusion, or inflammation, no edema, no skin discoloration, no clubbing    NEURO: alert & oriented x 3 with fluent speech, no focal motor/sensory deficits  PERFORMANCE STATUS: ECOG 1  LABORATORY DATA: Lab Results  Component Value Date   WBC 2.5* 04/23/2012   HGB 7.9* 04/23/2012   HCT 21.8* 04/23/2012   MCV 97.1 04/23/2012   PLT 197 04/23/2012      Chemistry      Component Value Date/Time   NA 141 04/23/2012 1310   NA 138 03/28/2012 0315   K 3.8 04/23/2012 1310   K 3.1* 03/28/2012 0315   CL 106 04/23/2012 1310   CL 101 03/28/2012 0315   CO2 26 04/23/2012 1310   CO2 26 03/28/2012 0315   BUN 12.7 04/23/2012 1310   BUN 14 03/28/2012 0315   CREATININE 0.9 04/23/2012 1310   CREATININE 0.82 03/28/2012 0315      Component Value Date/Time   CALCIUM 9.4 04/23/2012 1310   CALCIUM 9.2 03/28/2012 0315   ALKPHOS 135 04/23/2012 1310   ALKPHOS 119* 03/27/2012 1520   AST 36* 04/23/2012 1310   AST 36 03/27/2012 1520   ALT 35 04/23/2012 1310  ALT 50* 03/27/2012 1520   BILITOT 0.69 04/23/2012 1310   BILITOT 0.4 03/27/2012 1520       RADIOGRAPHIC STUDIES: Ct Entero Abd/pelvis W/cm  04/10/2012  **ADDENDUM** CREATED: 04/10/2012 11:30:57  Original report by Dr. Purcell Mouton.  Addendum by Dr. Rito Ehrlich.  Dr. Randa Evens provided additional history of a submucosal mass in the posterior gastric antrum on endoscopy.  On further review, there is a corresponding 2.0 x 2.0 cm enhancing mass in the wall of the posterior gastric antrum (series 7/image 25).  The lesion was present on multiple prior studies, measuring 1.9 x 2.2 cm in 2013, 1.4 x 2.0 cm in 2010, and 1.3 x 1.4 cm in 2006. There are no overt malignant features.  The slow interval growth and appearance favors a diagnosis of benign GIST, leiomyoma, or possibly schwannoma.  No definite macroscopic fat to suggest a lipoma.  This was discussed with Dr. Randa Evens on 04/10/2012 at 1135 hours.  **END ADDENDUM** SIGNED BY: Charline Bills, M.D.   04/09/2012  *RADIOLOGY REPORT*  Clinical Data:  Persistent anemia of undetermined etiology. Negative endoscopy  and colonoscopy.  Question occult mass.  CT ABDOMEN AND PELVIS WITH CONTRAST (CT ENTEROGRAPHY)  Technique:  Multidetector CT of the abdomen and pelvis during bolus administration of intravenous contrast. Negative oral contrast VoLumen was given.  Contrast: OMNIPAQUE IOHEXOL 300 MG/ML  SOLN  Comparison:  Abdominal pelvic CT 03/24/2011.  Findings: There is mild dependent atelectasis or scarring in both lung bases.  No significant pleural effusion is present.  The stomach, small bowel, appendix and colon demonstrate no focal mucosal lesions or surrounding inflammatory changes.  There is moderate stool throughout the colon.  There are no enlarged abdominal pelvic lymph nodes. Small lymph nodes within the porta hepatis are unchanged.  Surgical clips are again noted within the left retroperitoneum.  Mildly prominent gonadal veins are unchanged.  The liver, spleen, biliary system and pancreas appear normal status post cholecystectomy.  There is no adrenal mass.  The kidneys appear unchanged status post reported partial left nephrectomy.  There is no evidence of pelvic mass status post hysterectomy.  Some air is present within the vagina.  The bladder appears normal. There are no worrisome osseous findings.  IMPRESSION:  1.  No explanation for anemia/GI bleeding identified. 2.  Stable postsurgical changes status post reported partial left nephrectomy.  No evidence of metastatic disease. 3.  No acute abdominal pelvic findings.   Original Report Authenticated By: Carey Bullocks, M.D.     ASSESSMENT: This is a very pleasant 57 years old white female with persistent anemia most likely secondary to GI blood loss but I cannot rule out other etiologies at this point. The patient also has leukocytopenia most likely drug-induced especially with her current treatment with Imuran, Humira as well as colchicine and allopurinol.   PLAN: I have a lengthy discussion with the patient and her husband today about her condition and  further investigation to identify the etiology of her anemia and leukocytopenia.  I ordered several studies including repeat CBC, comprehensive metabolic panel, LDH, iron study, ferritin, serum folate, serum vitamin B12, serum erythropoietin as well as serum protein electrophoreses. The patient also has enhancing mass in the ward of the posterior gastric antrum that would need surgical evaluation and resection. She would contact Dr. Randa Evens for referral. I would see her back for followup visit in 2 weeks for evaluation and discussion of the pending lab results. If no clear etiology for her anemia and leukopenia, I may  consider the patient for a bone marrow biopsy and aspirate to rule out any underlying bone marrow abnormalities. The patient and her husband agreed to the current plan. She was advised to call immediately if she has any concerning symptoms in the interval.the patient was also advised to go immediately to the emergency department if she has any worsening of fatigue and weakness as she may require blood transfusion.   All questions were answered. The patient knows to call the clinic with any problems, questions or concerns. We can certainly see the patient much sooner if necessary.  Thank you so much for allowing me to participate in the care of Arianie Couse. I will continue to follow up the patient with you and assist in her care.  I spent 25 minutes counseling the patient face to face. The total time spent in the appointment was 50 minutes.  MOHAMED,MOHAMED K. 04/23/2012, 2:19 PM

## 2012-04-23 NOTE — Progress Notes (Signed)
Checked in new patient. No financial issues. °

## 2012-04-23 NOTE — Patient Instructions (Addendum)
Persistent anemia most likely secondary to blood loss from GI source. You also have persistent leukopenia most likely secondary to medications

## 2012-04-24 ENCOUNTER — Other Ambulatory Visit (HOSPITAL_COMMUNITY): Payer: Self-pay | Admitting: Gastroenterology

## 2012-04-24 DIAGNOSIS — D500 Iron deficiency anemia secondary to blood loss (chronic): Secondary | ICD-10-CM

## 2012-04-24 LAB — IRON AND TIBC
%SAT: 88 % — ABNORMAL HIGH (ref 20–55)
Iron: 270 ug/dL — ABNORMAL HIGH (ref 42–145)
TIBC: 308 ug/dL (ref 250–470)

## 2012-04-26 ENCOUNTER — Encounter (HOSPITAL_COMMUNITY): Payer: Self-pay

## 2012-04-26 ENCOUNTER — Encounter (HOSPITAL_COMMUNITY)
Admission: RE | Admit: 2012-04-26 | Discharge: 2012-04-26 | Disposition: A | Discharge status: Home / Self Care | Payer: Commercial Managed Care - PPO | Source: Ambulatory Visit | Attending: Gastroenterology | Admitting: Gastroenterology

## 2012-04-26 DIAGNOSIS — D539 Nutritional anemia, unspecified: Secondary | ICD-10-CM | POA: Insufficient documentation

## 2012-04-26 DIAGNOSIS — D500 Iron deficiency anemia secondary to blood loss (chronic): Secondary | ICD-10-CM

## 2012-04-26 MED ORDER — TECHNETIUM TC 99M-LABELED RED BLOOD CELLS IV KIT
25.4000 | PACK | Freq: Once | INTRAVENOUS | Status: AC | PRN
  Administered 2012-04-26: 25.4 via INTRAVENOUS

## 2012-04-29 ENCOUNTER — Ambulatory Visit (INDEPENDENT_AMBULATORY_CARE_PROVIDER_SITE_OTHER): Payer: Commercial Managed Care - PPO | Admitting: Surgery

## 2012-04-29 ENCOUNTER — Encounter (INDEPENDENT_AMBULATORY_CARE_PROVIDER_SITE_OTHER): Payer: Self-pay | Admitting: Surgery

## 2012-04-29 VITALS — BP 146/78 | HR 88 | Temp 97.1°F | Resp 20 | Ht 62.0 in | Wt 245.0 lb

## 2012-04-29 DIAGNOSIS — K3189 Other diseases of stomach and duodenum: Secondary | ICD-10-CM | POA: Insufficient documentation

## 2012-04-29 DIAGNOSIS — K319 Disease of stomach and duodenum, unspecified: Secondary | ICD-10-CM

## 2012-04-29 NOTE — Progress Notes (Signed)
Patient ID: [name], female   DOB: Mar 25, 1955, 57 y.o.   MRN: [id number]  Chief Complaint  Patient presents with  . New Evaluation    eval small gastric lesion carcinoid vs gyst    HPI [name] is a 57 y.o. female.   HPI This is a very pleasant patient referred by Dr. Dulce Sellar and Dr. Claria Dice for evaluation of a gastric mass. This was recently found during her workup of anemia. It is a submucosal mass which may have been present since 2006. She is currently undergoing a workup of her anemia and has a hemoglobin of 7.6. She gets shortness of breath and fatigue secondary to the anemia. No other clear source has been identified. She currently denies nausea, vomiting, or abdominal pain. There is no gross blood or melena in her stool Past Medical History  Diagnosis Date  . Hypertension   . Gout   . GERD (gastroesophageal reflux disease)   . History of kidney cancer   . Autoimmune hepatitis   . Rheumatoid arthritis   . Anemia     Past Surgical History  Procedure Laterality Date  . Abdominal hysterectomy    . Cholecystectomy, laparoscopic    . Partial nephrectomy    . Eus N/A 04/17/2012    Procedure: ESOPHAGEAL ENDOSCOPIC ULTRASOUND (EUS) RADIAL;  Surgeon: Willis Modena, MD;  Location: [facility];  Service: Endoscopy;  Laterality: N/A;  . Tonsillectomy and adenoidectomy    . Breast biopsy      "several"  . Cholecystectomy      Family History  Problem Relation Age of Onset  . Heart failure Father   . Diabetes Father   . Diverticulitis Mother   . Cancer Mother     breast  . Cancer Maternal Grandmother     colon    Social History History  Substance Use Topics  . Smoking status: Never Smoker   . Smokeless tobacco: Never Used  . Alcohol Use: 0.0 oz/week    0 drink(s) per week     Comment: occ    Allergies  Allergen Reactions  . Aspirin Nausea Only  . Latex Other (See Comments)    sensitivity  . Lisinopril Cough  . Methotrexate Derivatives Other (See  Comments)    Elevated liver enzymes  . Morphine And Related Nausea And Vomiting  . Other Swelling    All mycins-swelling  BP-unsure of name -cough  . Penicillins Other (See Comments)    Yeast infection  . Prozac (Fluoxetine Hcl) Other (See Comments)    unknown  . Aloe Rash    Burn rash  . Lactose Intolerance (Gi)     Current Outpatient Prescriptions  Medication Sig Dispense Refill  . adalimumab (HUMIRA) 40 MG/0.8ML injection Inject 40 mg into the skin once a week.      Marland Kitchen allopurinol (ZYLOPRIM) 300 MG tablet Take 300 mg by mouth daily.      Marland Kitchen azaTHIOprine (IMURAN) 50 MG tablet Take 50-100 mg by mouth daily. 2 tablets in the morning 1 tablet in the evening      . colchicine 0.6 MG tablet Take 0.6 mg by mouth daily.      . cyclobenzaprine (FLEXERIL) 10 MG tablet Take 10 mg by mouth 3 (three) times daily as needed for muscle spasms.      Marland Kitchen esomeprazole (NEXIUM) 40 MG capsule Take 40 mg by mouth 2 (two) times daily.      . predniSONE (DELTASONE) 5 MG tablet Take 5 mg by mouth daily.  No current facility-administered medications for this visit.    Review of Systems Review of Systems  Constitutional: Positive for fever, fatigue and unexpected weight change. Negative for chills.  HENT: Negative.  Negative for hearing loss, congestion, sore throat, trouble swallowing and voice change.   Eyes: Negative.  Negative for visual disturbance.  Respiratory: Positive for shortness of breath. Negative for cough and wheezing.   Cardiovascular: Negative.  Negative for chest pain, palpitations and leg swelling.  Gastrointestinal: Positive for nausea and blood in stool. Negative for vomiting, abdominal pain, diarrhea, constipation, abdominal distention and anal bleeding.  Endocrine: Negative.   Genitourinary: Negative.  Negative for hematuria, vaginal bleeding and difficulty urinating.  Musculoskeletal: Positive for arthralgias.  Skin: Negative for rash and wound.  Neurological: Negative for  seizures, syncope and headaches.  Hematological: Negative for adenopathy. Does not bruise/bleed easily.  Psychiatric/Behavioral: Negative for confusion.    Blood pressure 146/78, pulse 88, temperature 97.1 F (36.2 C), temperature source Temporal, resp. rate 20, height 5\' 2"  (1.575 m), weight 245 lb (111.131 kg).  Physical Exam Physical Exam  Constitutional: She is oriented to person, place, and time. She appears well-developed and well-nourished. No distress.  Obese female in no acute distress  HENT:  Head: Normocephalic and atraumatic.  Right Ear: External ear normal.  Left Ear: External ear normal.  Nose: Nose normal.  Mouth/Throat: Oropharynx is clear and moist.  Eyes: Conjunctivae and EOM are normal. Pupils are equal, round, and reactive to light. Right eye exhibits no discharge. Left eye exhibits no discharge. No scleral icterus.  Neck: Normal range of motion. Neck supple. No tracheal deviation present. No thyromegaly present.  Cardiovascular: Normal rate, regular rhythm, normal heart sounds and intact distal pulses.   No murmur heard. Pulmonary/Chest: Effort normal and breath sounds normal. No respiratory distress. She has no wheezes.  Abdominal: Soft. Bowel sounds are normal. She exhibits no distension. There is no tenderness. There is no rebound.  Multiple small well-healed incisions  Musculoskeletal: Normal range of motion. She exhibits no edema and no tenderness.  Lymphadenopathy:    She has no cervical adenopathy.  Neurological: She is alert and oriented to person, place, and time.  Skin: Skin is warm and dry. No rash noted. No erythema.  Psychiatric: Her behavior is normal. Judgment normal.    Data Reviewed I have reviewed her CAT scans. The most recent CT enterography demonstrated a mass which was 2.3 x 1.9 cm in size proximal to the pylorus on the posterior gastric wall. Cytology showed no cells suspicious for gastrointestinal stromal tumor. Carcinoid is  suggested  Assessment    Gastric mass and anemia     Plan    I discussed this with the patient in detail. I also should the CAT scan to Dr. Michaell Cowing. We will attempt laparoscopic removal of the mass. I explained the risks which includes but is not limited to bleeding, infection, need for conversion to open procedure, need for distal gastrectomy, risk of anastomotic leak, postoperative recovery, need for further surgery based on the pathology results, et Karie Soda. I also discussed cardiopulmonary issues and DVT as well as need for transfusion. Surgery will be scheduled. I also discussed intraoperative endoscopy.        Asees Manfredi A 04/29/2012, 3:03 PM

## 2012-04-30 ENCOUNTER — Other Ambulatory Visit (INDEPENDENT_AMBULATORY_CARE_PROVIDER_SITE_OTHER): Payer: Self-pay | Admitting: Surgery

## 2012-04-30 ENCOUNTER — Other Ambulatory Visit (HOSPITAL_COMMUNITY): Payer: Self-pay | Admitting: *Deleted

## 2012-05-01 ENCOUNTER — Encounter (HOSPITAL_COMMUNITY)
Admission: RE | Admit: 2012-05-01 | Discharge: 2012-05-01 | Disposition: A | Discharge status: Home / Self Care | Payer: Commercial Managed Care - PPO | Source: Ambulatory Visit | Attending: Gastroenterology | Admitting: Gastroenterology

## 2012-05-01 DIAGNOSIS — D649 Anemia, unspecified: Secondary | ICD-10-CM | POA: Insufficient documentation

## 2012-05-01 NOTE — Progress Notes (Signed)
Given blood transfusion fact and advised to call dr if any problems and voiced understanding

## 2012-05-01 NOTE — Pre-Procedure Instructions (Signed)
[name]  05/01/2012   Your procedure is scheduled on:  Wednesday, March 26th  Report to [facility] Short Stay Center at 1000 AM.  Call this number if you have problems the morning of surgery: [phone]   Remember:   Do not eat food or drink liquids after midnight.    Take these medicines the morning of surgery with A SIP OF WATER: Nexium, allopurinol   Do not wear jewelry, make-up or nail polish.  Do not wear lotions, powders, or perfumes,deodorant.  Do not shave 48 hours prior to surgery.   Do not bring valuables to the hospital.  Contacts, dentures or bridgework may not be worn into surgery.  Leave suitcase in the car. After surgery it may be brought to your room.  For patients admitted to the hospital, checkout time is 11:00 AM the day of discharge.   Patients discharged the day of surgery will not be allowed to drive home.    Special Instructions: Shower using CHG 2 nights before surgery and the night before surgery.  If you shower the day of surgery use CHG.  Use special wash - you have one bottle of CHG for all showers.  You should use approximately 1/3 of the bottle for each shower.   Please read over the following fact sheets that you were given: Pain Booklet, Coughing and Deep Breathing, Blood Transfusion Information, MRSA Information and Surgical Site Infection Prevention

## 2012-05-02 ENCOUNTER — Encounter (HOSPITAL_COMMUNITY): Payer: Self-pay

## 2012-05-02 ENCOUNTER — Encounter (HOSPITAL_COMMUNITY)
Admission: RE | Admit: 2012-05-02 | Discharge: 2012-05-02 | Disposition: A | Discharge status: Home / Self Care | Payer: Commercial Managed Care - PPO | Source: Ambulatory Visit | Attending: Orthopaedic Surgery | Admitting: Orthopaedic Surgery

## 2012-05-02 ENCOUNTER — Encounter (HOSPITAL_COMMUNITY)
Admission: RE | Admit: 2012-05-02 | Discharge: 2012-05-02 | Disposition: A | Discharge status: Home / Self Care | Payer: Commercial Managed Care - PPO | Source: Ambulatory Visit | Attending: Anesthesiology | Admitting: Anesthesiology

## 2012-05-02 LAB — CBC WITH DIFFERENTIAL/PLATELET
Basophils Absolute: 0 10*3/uL (ref 0.0–0.1)
HCT: 24.4 % — ABNORMAL LOW (ref 36.0–46.0)
Lymphocytes Relative: 51 % — ABNORMAL HIGH (ref 12–46)
Neutro Abs: 1.1 10*3/uL — ABNORMAL LOW (ref 1.7–7.7)
Neutrophils Relative %: 39 % — ABNORMAL LOW (ref 43–77)
Platelets: 194 10*3/uL (ref 150–400)
RDW: 19.8 % — ABNORMAL HIGH (ref 11.5–15.5)
WBC: 2.8 10*3/uL — ABNORMAL LOW (ref 4.0–10.5)

## 2012-05-02 LAB — TYPE AND SCREEN
ABO/RH(D): A POS
Antibody Screen: NEGATIVE
Unit division: 0

## 2012-05-02 LAB — SURGICAL PCR SCREEN
MRSA, PCR: NEGATIVE
Staphylococcus aureus: NEGATIVE

## 2012-05-02 LAB — PROTIME-INR
INR: 1 (ref 0.00–1.49)
Prothrombin Time: 13.1 seconds (ref 11.6–15.2)

## 2012-05-02 LAB — APTT: aPTT: 36 seconds (ref 24–37)

## 2012-05-02 NOTE — Progress Notes (Signed)
05/02/12 0807  OBSTRUCTIVE SLEEP APNEA  Have you ever been diagnosed with sleep apnea through a sleep study? No  Do you snore loudly (loud enough to be heard through closed doors)?  1  Do you often feel tired, fatigued, or sleepy during the daytime? 0  Has anyone observed you stop breathing during your sleep? 0  Do you have, or are you being treated for high blood pressure? 1  BMI more than 35 kg/m2? 1  Age over 56 years old? 1  Neck circumference greater than 40 cm/18 inches? 0  Gender: 0  Obstructive Sleep Apnea Score 4  Score 4 or greater  Results sent to PCP

## 2012-05-02 NOTE — Progress Notes (Signed)
Primary Physician - Dr. Lanier Ensign Does not have a cardiologist No previous cardiac testing

## 2012-05-03 ENCOUNTER — Encounter (HOSPITAL_COMMUNITY): Payer: Self-pay

## 2012-05-03 NOTE — Progress Notes (Addendum)
Anesthesia chart review: Patient is a 57 year old female scheduled for excision of gastric mass, possible partial gastrectomy by Dr. Magnus Ivan on 05/08/2012. She is thought to have either a carcinoid tumor or GIST.  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 OSA screening score was 4.  PCP is Dr. [name]. HEM/ONC Dr. Arbutus Ped (for anemia/leukocytopenia).  GI: Dr. Carman Ching.  EKG on 05/02/12 showed NSR, poor r wave progression, non-specific ST/T wave changes.  EKG appears stable since 10/40/11.  Chest x-ray on 05/02/2012 showed no acute cardiopulmonary abnormality.  Labs from 05/02/12 noted.  H/H 8.7/24.4 (up from 7.9/21.8 on 04/23/12).  Patient is s/p transfusion on 05/01/12.  WBC stable at 2.8.  PLT 194.  Epic indicates labs have been reviewed by Dr. Magnus Ivan.  She also has HEM/ONC appointment on 05/07/12 (It appears that she will have a CBC done then).  She is already scheduled for a T&C to be drawn on the day of surgery.  I will also have her CMET repeated since it will be > 76 days old--unless done at her HEM/ONC appointment on 05/07/12.      Velna Ochs [facility] Short Stay Center/Anesthesiology Phone [phone] 05/03/2012 10:54 AM  Addendum: 05/07/12 1155:  Dr. Asa Lente office canceled patient's 05/07/12 appointment and asked her to reschedule following her surgery.  Subsequently, I have ordered a repeat CBC on the day of surgery as well.

## 2012-05-04 ENCOUNTER — Encounter: Payer: Self-pay | Admitting: Medical Oncology

## 2012-05-06 ENCOUNTER — Encounter: Payer: Self-pay | Admitting: Internal Medicine

## 2012-05-06 ENCOUNTER — Telehealth: Payer: Self-pay | Admitting: Medical Oncology

## 2012-05-06 NOTE — Telephone Encounter (Signed)
I called and left message for pt that we are cancelling appt tomorrow with lab and Dr Arbutus Ped and to callback after surgery to r/s

## 2012-05-07 ENCOUNTER — Ambulatory Visit: Payer: Commercial Managed Care - PPO | Admitting: Internal Medicine

## 2012-05-07 ENCOUNTER — Telehealth: Payer: Self-pay | Admitting: Internal Medicine

## 2012-05-07 ENCOUNTER — Other Ambulatory Visit: Payer: Commercial Managed Care - PPO | Admitting: Lab

## 2012-05-07 MED ORDER — CIPROFLOXACIN IN D5W 400 MG/200ML IV SOLN
400.0000 mg | INTRAVENOUS | Status: AC
  Administered 2012-05-08: 400 mg via INTRAVENOUS
  Filled 2012-05-07: qty 200

## 2012-05-07 NOTE — H&P (Signed)
Patient ID: [name], female DOB: [birth year], 56 y.o. MRN: [id number]  Chief Complaint   Patient presents with   .  New Evaluation     eval small gastric lesion carcinoid vs gyst   HPI  [name] is a 57 y.o. female.  HPI  This is a very pleasant patient referred by Dr. Dulce Sellar and Dr. Claria Dice for evaluation of a gastric mass. This was recently found during her workup of anemia. It is a submucosal mass which may have been present since 2006. She is currently undergoing a workup of her anemia and has a hemoglobin of 7.6. She gets shortness of breath and fatigue secondary to the anemia. No other clear source has been identified. She currently denies nausea, vomiting, or abdominal pain. There is no gross blood or melena in her stool  Past Medical History   Diagnosis  Date   .  Hypertension    .  Gout    .  GERD (gastroesophageal reflux disease)    .  History of kidney cancer    .  Autoimmune hepatitis    .  Rheumatoid arthritis    .  Anemia     Past Surgical History   Procedure  Laterality  Date   .  Abdominal hysterectomy     .  Cholecystectomy, laparoscopic     .  Partial nephrectomy     .  Eus  N/A  04/17/2012     Procedure: ESOPHAGEAL ENDOSCOPIC ULTRASOUND (EUS) RADIAL; Surgeon: Willis Modena, MD; Location: [facility]; Service: Endoscopy; Laterality: N/A;   .  Tonsillectomy and adenoidectomy     .  Breast biopsy       "several"   .  Cholecystectomy      Family History   Problem  Relation  Age of Onset   .  Heart failure  Father    .  Diabetes  Father    .  Diverticulitis  Mother    .  Cancer  Mother      breast   .  Cancer  Maternal Grandmother      colon   Social History  History   Substance Use Topics   .  Smoking status:  Never Smoker   .  Smokeless tobacco:  Never Used   .  Alcohol Use:  0.0 oz/week     0 drink(s) per week      Comment: occ    Allergies   Allergen  Reactions   .  Aspirin  Nausea Only   .  Latex  Other (See Comments)      sensitivity   .  Lisinopril  Cough   .  Methotrexate Derivatives  Other (See Comments)     Elevated liver enzymes   .  Morphine And Related  Nausea And Vomiting   .  Other  Swelling     All mycins-swelling  BP-unsure of name -cough   .  Penicillins  Other (See Comments)     Yeast infection   .  Prozac (Fluoxetine Hcl)  Other (See Comments)     unknown   .  Aloe  Rash     Burn rash   .  Lactose Intolerance (Gi)     Current Outpatient Prescriptions   Medication  Sig  Dispense  Refill   .  adalimumab (HUMIRA) 40 MG/0.8ML injection  Inject 40 mg into the skin once a week.     Marland Kitchen  allopurinol (ZYLOPRIM) 300 MG tablet  Take  300 mg by mouth daily.     Marland Kitchen  azaTHIOprine (IMURAN) 50 MG tablet  Take 50-100 mg by mouth daily. 2 tablets in the morning  1 tablet in the evening     .  colchicine 0.6 MG tablet  Take 0.6 mg by mouth daily.     .  cyclobenzaprine (FLEXERIL) 10 MG tablet  Take 10 mg by mouth 3 (three) times daily as needed for muscle spasms.     Marland Kitchen  esomeprazole (NEXIUM) 40 MG capsule  Take 40 mg by mouth 2 (two) times daily.     .  predniSONE (DELTASONE) 5 MG tablet  Take 5 mg by mouth daily.      No current facility-administered medications for this visit.   Review of Systems  Review of Systems  Constitutional: Positive for fever, fatigue and unexpected weight change. Negative for chills.  HENT: Negative. Negative for hearing loss, congestion, sore throat, trouble swallowing and voice change.  Eyes: Negative. Negative for visual disturbance.  Respiratory: Positive for shortness of breath. Negative for cough and wheezing.  Cardiovascular: Negative. Negative for chest pain, palpitations and leg swelling.  Gastrointestinal: Positive for nausea and blood in stool. Negative for vomiting, abdominal pain, diarrhea, constipation, abdominal distention and anal bleeding.  Endocrine: Negative.  Genitourinary: Negative. Negative for hematuria, vaginal bleeding and difficulty urinating.   Musculoskeletal: Positive for arthralgias.  Skin: Negative for rash and wound.  Neurological: Negative for seizures, syncope and headaches.  Hematological: Negative for adenopathy. Does not bruise/bleed easily.  Psychiatric/Behavioral: Negative for confusion.  Blood pressure 146/78, pulse 88, temperature 97.1 F (36.2 C), temperature source Temporal, resp. rate 20, height 5\' 2"  (1.575 m), weight 245 lb (111.131 kg).  Physical Exam  Physical Exam  Constitutional: She is oriented to person, place, and time. She appears well-developed and well-nourished. No distress.  Obese female in no acute distress  HENT:  Head: Normocephalic and atraumatic.  Right Ear: External ear normal.  Left Ear: External ear normal.  Nose: Nose normal.  Mouth/Throat: Oropharynx is clear and moist.  Eyes: Conjunctivae and EOM are normal. Pupils are equal, round, and reactive to light. Right eye exhibits no discharge. Left eye exhibits no discharge. No scleral icterus.  Neck: Normal range of motion. Neck supple. No tracheal deviation present. No thyromegaly present.  Cardiovascular: Normal rate, regular rhythm, normal heart sounds and intact distal pulses.  No murmur heard.  Pulmonary/Chest: Effort normal and breath sounds normal. No respiratory distress. She has no wheezes.  Abdominal: Soft. Bowel sounds are normal. She exhibits no distension. There is no tenderness. There is no rebound.  Multiple small well-healed incisions  Musculoskeletal: Normal range of motion. She exhibits no edema and no tenderness.  Lymphadenopathy:  She has no cervical adenopathy.  Neurological: She is alert and oriented to person, place, and time.  Skin: Skin is warm and dry. No rash noted. No erythema.  Psychiatric: Her behavior is normal. Judgment normal.  Data Reviewed  I have reviewed her CAT scans. The most recent CT enterography demonstrated a mass which was 2.3 x 1.9 cm in size proximal to the pylorus on the posterior gastric  wall. Cytology showed no cells suspicious for gastrointestinal stromal tumor. Carcinoid is suggested   Assessment  Gastric mass and anemia   Plan  I discussed this with the patient in detail. Excision of the mass was recommended. I do suspect this is benign as it has been there many years with minimal increase in size and no surrounding adenopathy.  I will attempt this as a limited resection. I explained the risks which include but are not limited to bleeding, infection, bowel leak, need for a partial gastrectomy, injury to shredding structures, cardiopulmonary issues, DVT, etc. I also discussed the potential to do this laparoscopically by one of my other partners. She was to go ahead and proceed with open removal as soon as possible and not wait to see what other partners. I believe this is reasonable since surgery will be scheduled. Likelihood of success is good.

## 2012-05-08 ENCOUNTER — Ambulatory Visit (HOSPITAL_COMMUNITY): Payer: Commercial Managed Care - PPO | Admitting: Anesthesiology

## 2012-05-08 ENCOUNTER — Encounter (HOSPITAL_COMMUNITY)
Admission: RE | Disposition: A | Discharge status: Home / Self Care | Payer: Self-pay | Source: Ambulatory Visit | Attending: Surgery

## 2012-05-08 ENCOUNTER — Encounter (HOSPITAL_COMMUNITY): Payer: Self-pay | Admitting: *Deleted

## 2012-05-08 ENCOUNTER — Inpatient Hospital Stay (HOSPITAL_COMMUNITY)
Admission: RE | Admit: 2012-05-08 | Discharge: 2012-05-17 | DRG: 327 | Disposition: A | Discharge status: Home / Self Care | Payer: Commercial Managed Care - PPO | Source: Ambulatory Visit | Attending: Surgery | Admitting: Surgery

## 2012-05-08 ENCOUNTER — Encounter (HOSPITAL_COMMUNITY): Payer: Self-pay | Admitting: Vascular Surgery

## 2012-05-08 DIAGNOSIS — K3189 Other diseases of stomach and duodenum: Secondary | ICD-10-CM | POA: Diagnosis present

## 2012-05-08 DIAGNOSIS — J9819 Other pulmonary collapse: Secondary | ICD-10-CM | POA: Diagnosis not present

## 2012-05-08 DIAGNOSIS — E876 Hypokalemia: Secondary | ICD-10-CM | POA: Diagnosis not present

## 2012-05-08 DIAGNOSIS — T41205A Adverse effect of unspecified general anesthetics, initial encounter: Secondary | ICD-10-CM | POA: Diagnosis not present

## 2012-05-08 DIAGNOSIS — R339 Retention of urine, unspecified: Secondary | ICD-10-CM | POA: Diagnosis not present

## 2012-05-08 DIAGNOSIS — Z9089 Acquired absence of other organs: Secondary | ICD-10-CM

## 2012-05-08 DIAGNOSIS — N9989 Other postprocedural complications and disorders of genitourinary system: Secondary | ICD-10-CM | POA: Diagnosis not present

## 2012-05-08 DIAGNOSIS — F29 Unspecified psychosis not due to a substance or known physiological condition: Secondary | ICD-10-CM | POA: Diagnosis not present

## 2012-05-08 DIAGNOSIS — K754 Autoimmune hepatitis: Secondary | ICD-10-CM | POA: Diagnosis present

## 2012-05-08 DIAGNOSIS — IMO0002 Reserved for concepts with insufficient information to code with codable children: Secondary | ICD-10-CM | POA: Diagnosis not present

## 2012-05-08 DIAGNOSIS — D649 Anemia, unspecified: Secondary | ICD-10-CM | POA: Diagnosis present

## 2012-05-08 DIAGNOSIS — Z885 Allergy status to narcotic agent status: Secondary | ICD-10-CM

## 2012-05-08 DIAGNOSIS — T8089XA Other complications following infusion, transfusion and therapeutic injection, initial encounter: Secondary | ICD-10-CM | POA: Diagnosis not present

## 2012-05-08 DIAGNOSIS — I10 Essential (primary) hypertension: Secondary | ICD-10-CM | POA: Diagnosis present

## 2012-05-08 DIAGNOSIS — Y849 Medical procedure, unspecified as the cause of abnormal reaction of the patient, or of later complication, without mention of misadventure at the time of the procedure: Secondary | ICD-10-CM | POA: Diagnosis not present

## 2012-05-08 DIAGNOSIS — Z85528 Personal history of other malignant neoplasm of kidney: Secondary | ICD-10-CM

## 2012-05-08 DIAGNOSIS — Z90710 Acquired absence of both cervix and uterus: Secondary | ICD-10-CM

## 2012-05-08 DIAGNOSIS — D3A092 Benign carcinoid tumor of the stomach: Principal | ICD-10-CM | POA: Diagnosis present

## 2012-05-08 DIAGNOSIS — J988 Other specified respiratory disorders: Secondary | ICD-10-CM | POA: Diagnosis not present

## 2012-05-08 DIAGNOSIS — M069 Rheumatoid arthritis, unspecified: Secondary | ICD-10-CM | POA: Diagnosis present

## 2012-05-08 DIAGNOSIS — K219 Gastro-esophageal reflux disease without esophagitis: Secondary | ICD-10-CM | POA: Diagnosis present

## 2012-05-08 DIAGNOSIS — Z809 Family history of malignant neoplasm, unspecified: Secondary | ICD-10-CM

## 2012-05-08 DIAGNOSIS — Z905 Acquired absence of kidney: Secondary | ICD-10-CM

## 2012-05-08 DIAGNOSIS — Z6841 Body Mass Index (BMI) 40.0 and over, adult: Secondary | ICD-10-CM

## 2012-05-08 DIAGNOSIS — M109 Gout, unspecified: Secondary | ICD-10-CM | POA: Diagnosis present

## 2012-05-08 DIAGNOSIS — Z91040 Latex allergy status: Secondary | ICD-10-CM

## 2012-05-08 HISTORY — PX: GASTRECTOMY: SHX58

## 2012-05-08 LAB — CBC
Platelets: 141 10*3/uL — ABNORMAL LOW (ref 150–400)
RBC: 2.09 MIL/uL — ABNORMAL LOW (ref 3.87–5.11)
RDW: 18.3 % — ABNORMAL HIGH (ref 11.5–15.5)
WBC: 2.1 10*3/uL — ABNORMAL LOW (ref 4.0–10.5)

## 2012-05-08 LAB — COMPREHENSIVE METABOLIC PANEL
ALT: 43 U/L — ABNORMAL HIGH (ref 0–35)
AST: 37 U/L (ref 0–37)
Albumin: 3.8 g/dL (ref 3.5–5.2)
CO2: 27 mEq/L (ref 19–32)
Chloride: 108 mEq/L (ref 96–112)
GFR calc non Af Amer: 80 mL/min — ABNORMAL LOW (ref >90)
Sodium: 143 mEq/L (ref 135–145)
Total Bilirubin: 0.6 mg/dL (ref 0.3–1.2)

## 2012-05-08 SURGERY — GASTRECTOMY, TOTAL
Anesthesia: General | Site: Abdomen | Wound class: Clean Contaminated

## 2012-05-08 MED ORDER — POLYVINYL ALCOHOL 1.4 % OP SOLN
1.0000 [drp] | Freq: Every day | OPHTHALMIC | Status: DC
  Administered 2012-05-10 – 2012-05-16 (×5): 1 [drp] via OPHTHALMIC
  Filled 2012-05-08 (×3): qty 15

## 2012-05-08 MED ORDER — PROPOFOL 10 MG/ML IV BOLUS
INTRAVENOUS | Status: DC | PRN
  Administered 2012-05-08: 200 mg via INTRAVENOUS

## 2012-05-08 MED ORDER — 0.9 % SODIUM CHLORIDE (POUR BTL) OPTIME
TOPICAL | Status: DC | PRN
  Administered 2012-05-08: 1000 mL

## 2012-05-08 MED ORDER — ALBUTEROL SULFATE HFA 108 (90 BASE) MCG/ACT IN AERS
INHALATION_SPRAY | RESPIRATORY_TRACT | Status: DC | PRN
  Administered 2012-05-08: 2 via RESPIRATORY_TRACT

## 2012-05-08 MED ORDER — MIDAZOLAM HCL 5 MG/5ML IJ SOLN
INTRAMUSCULAR | Status: DC | PRN
  Administered 2012-05-08: 2 mg via INTRAVENOUS

## 2012-05-08 MED ORDER — LIDOCAINE HCL (CARDIAC) 20 MG/ML IV SOLN
INTRAVENOUS | Status: DC | PRN
  Administered 2012-05-08: 50 mg via INTRAVENOUS

## 2012-05-08 MED ORDER — SUCCINYLCHOLINE CHLORIDE 20 MG/ML IJ SOLN
INTRAMUSCULAR | Status: DC | PRN
  Administered 2012-05-08 (×2): 100 mg via INTRAVENOUS

## 2012-05-08 MED ORDER — POTASSIUM CHLORIDE IN NACL 20-0.9 MEQ/L-% IV SOLN
INTRAVENOUS | Status: DC
  Administered 2012-05-08: 16:00:00 via INTRAVENOUS
  Administered 2012-05-09: 100 mL/h via INTRAVENOUS
  Administered 2012-05-09 – 2012-05-16 (×9): via INTRAVENOUS
  Filled 2012-05-08 (×13): qty 1000

## 2012-05-08 MED ORDER — SODIUM CHLORIDE 0.9 % IJ SOLN: 9.0000 mL | INTRAMUSCULAR | Status: DC | PRN

## 2012-05-08 MED ORDER — LACTATED RINGERS IV SOLN: INTRAVENOUS | Status: DC

## 2012-05-08 MED ORDER — PHENYLEPHRINE HCL 10 MG/ML IJ SOLN
10.0000 mg | INTRAVENOUS | Status: DC | PRN
  Administered 2012-05-08: 50 ug/min via INTRAVENOUS

## 2012-05-08 MED ORDER — PROMETHAZINE HCL 25 MG/ML IJ SOLN: 6.2500 mg | INTRAMUSCULAR | Status: DC | PRN

## 2012-05-08 MED ORDER — HEPARIN SODIUM (PORCINE) 5000 UNIT/ML IJ SOLN
5000.0000 [IU] | Freq: Three times a day (TID) | INTRAMUSCULAR | Status: DC
  Administered 2012-05-08 – 2012-05-17 (×26): 5000 [IU] via SUBCUTANEOUS
  Filled 2012-05-08 (×32): qty 1

## 2012-05-08 MED ORDER — GLYCOPYRROLATE 0.2 MG/ML IJ SOLN
INTRAMUSCULAR | Status: DC | PRN
  Administered 2012-05-08: .8 mg via INTRAVENOUS

## 2012-05-08 MED ORDER — OXYCODONE HCL 5 MG PO TABS
5.0000 mg | ORAL_TABLET | Freq: Once | ORAL | Status: DC | PRN
Start: 2012-05-08 — End: 2012-05-08

## 2012-05-08 MED ORDER — FENTANYL CITRATE 0.05 MG/ML IJ SOLN
INTRAMUSCULAR | Status: DC | PRN
  Administered 2012-05-08: 50 ug via INTRAVENOUS
  Administered 2012-05-08: 100 ug via INTRAVENOUS

## 2012-05-08 MED ORDER — ONDANSETRON HCL 4 MG/2ML IJ SOLN: 4.0000 mg | Freq: Four times a day (QID) | INTRAMUSCULAR | Status: DC | PRN

## 2012-05-08 MED ORDER — NEOSTIGMINE METHYLSULFATE 1 MG/ML IJ SOLN
INTRAMUSCULAR | Status: DC | PRN
  Administered 2012-05-08: 5 mg via INTRAVENOUS

## 2012-05-08 MED ORDER — HYDROMORPHONE 0.3 MG/ML IV SOLN
INTRAVENOUS | Status: DC
  Administered 2012-05-08: 0.9 mg via INTRAVENOUS
  Administered 2012-05-08: 14:00:00 via INTRAVENOUS
  Administered 2012-05-09: 1.2 mg via INTRAVENOUS
  Administered 2012-05-09: 1.5 mg via INTRAVENOUS
  Administered 2012-05-09: 2.1 mg via INTRAVENOUS
  Administered 2012-05-09: 0.3 mg via INTRAVENOUS
  Administered 2012-05-09: 0.9 mg via INTRAVENOUS
  Administered 2012-05-09: 12:00:00 via INTRAVENOUS
  Administered 2012-05-09: 0.3 mg via INTRAVENOUS
  Administered 2012-05-09: 1.2 mg via INTRAVENOUS
  Administered 2012-05-10: 0.3 mg via INTRAVENOUS
  Administered 2012-05-10: 1.2 mg via INTRAVENOUS
  Administered 2012-05-10 – 2012-05-11 (×2): 0.6 mg via INTRAVENOUS
  Administered 2012-05-11: 0.9 mg via INTRAVENOUS
  Administered 2012-05-11: 02:00:00 via INTRAVENOUS
  Administered 2012-05-11: 0.3 mg via INTRAVENOUS
  Administered 2012-05-11: 0.9 mg via INTRAVENOUS
  Administered 2012-05-11: 1.11 mg via INTRAVENOUS
  Administered 2012-05-12: 0.6 mg via INTRAVENOUS
  Administered 2012-05-12 – 2012-05-13 (×2): 0.3 mg via INTRAVENOUS
  Administered 2012-05-13: 1.78 mg via INTRAVENOUS
  Administered 2012-05-13: 0.3 mg via INTRAVENOUS
  Administered 2012-05-13: 0.6 mg via INTRAVENOUS
  Filled 2012-05-08 (×3): qty 25

## 2012-05-08 MED ORDER — HYDROMORPHONE HCL PF 1 MG/ML IJ SOLN
0.2500 mg | INTRAMUSCULAR | Status: DC | PRN
  Administered 2012-05-08 (×2): 0.5 mg via INTRAVENOUS

## 2012-05-08 MED ORDER — PROMETHAZINE HCL 25 MG/ML IJ SOLN
12.5000 mg | Freq: Four times a day (QID) | INTRAMUSCULAR | Status: DC | PRN
  Administered 2012-05-12: 12.5 mg via INTRAVENOUS
  Filled 2012-05-08: qty 1

## 2012-05-08 MED ORDER — DEXAMETHASONE SODIUM PHOSPHATE 4 MG/ML IJ SOLN
INTRAMUSCULAR | Status: DC | PRN
  Administered 2012-05-08: 4 mg via INTRAVENOUS

## 2012-05-08 MED ORDER — ROCURONIUM BROMIDE 100 MG/10ML IV SOLN
INTRAVENOUS | Status: DC | PRN
  Administered 2012-05-08: 50 mg via INTRAVENOUS

## 2012-05-08 MED ORDER — NALOXONE HCL 0.4 MG/ML IJ SOLN: 0.4000 mg | INTRAMUSCULAR | Status: DC | PRN

## 2012-05-08 MED ORDER — ONDANSETRON HCL 4 MG/2ML IJ SOLN
INTRAMUSCULAR | Status: DC | PRN
  Administered 2012-05-08: 4 mg via INTRAVENOUS

## 2012-05-08 MED ORDER — CARBOXYMETHYLCELLULOSE SODIUM 0.25 % OP SOLN: Freq: Every morning | OPHTHALMIC | Status: DC

## 2012-05-08 MED ORDER — DIPHENHYDRAMINE HCL 12.5 MG/5ML PO ELIX
12.5000 mg | ORAL_SOLUTION | Freq: Four times a day (QID) | ORAL | Status: DC | PRN
  Filled 2012-05-08: qty 5

## 2012-05-08 MED ORDER — HYDROMORPHONE HCL PF 1 MG/ML IJ SOLN
INTRAMUSCULAR | Status: AC
  Filled 2012-05-08: qty 1

## 2012-05-08 MED ORDER — DIPHENHYDRAMINE HCL 50 MG/ML IJ SOLN: 12.5000 mg | Freq: Four times a day (QID) | INTRAMUSCULAR | Status: DC | PRN

## 2012-05-08 MED ORDER — MEPERIDINE HCL 25 MG/ML IJ SOLN: 6.2500 mg | INTRAMUSCULAR | Status: DC | PRN

## 2012-05-08 MED ORDER — PANTOPRAZOLE SODIUM 40 MG IV SOLR
40.0000 mg | INTRAVENOUS | Status: DC
  Administered 2012-05-08 – 2012-05-16 (×9): 40 mg via INTRAVENOUS
  Filled 2012-05-08 (×10): qty 40

## 2012-05-08 MED ORDER — ONDANSETRON HCL 4 MG PO TABS
4.0000 mg | ORAL_TABLET | Freq: Four times a day (QID) | ORAL | Status: DC | PRN
  Administered 2012-05-12: 4 mg via ORAL
  Filled 2012-05-08: qty 1

## 2012-05-08 MED ORDER — OXYCODONE HCL 5 MG/5ML PO SOLN
5.0000 mg | Freq: Once | ORAL | Status: DC | PRN
Start: 2012-05-08 — End: 2012-05-08

## 2012-05-08 MED ORDER — ARTIFICIAL TEARS OP OINT
TOPICAL_OINTMENT | OPHTHALMIC | Status: DC | PRN
  Administered 2012-05-08: 1 via OPHTHALMIC

## 2012-05-08 MED ORDER — LACTATED RINGERS IV SOLN
INTRAVENOUS | Status: DC | PRN
  Administered 2012-05-08 (×2): via INTRAVENOUS

## 2012-05-08 MED ORDER — HYDROMORPHONE 0.3 MG/ML IV SOLN
INTRAVENOUS | Status: AC
  Filled 2012-05-08: qty 25

## 2012-05-08 MED ORDER — PHENYLEPHRINE HCL 10 MG/ML IJ SOLN
INTRAMUSCULAR | Status: DC | PRN
  Administered 2012-05-08 (×2): 80 ug via INTRAVENOUS

## 2012-05-08 SURGICAL SUPPLY — 55 items
BLADE SURG ROTATE 9660 (MISCELLANEOUS) IMPLANT
CANISTER SUCTION 2500CC (MISCELLANEOUS) ×2 IMPLANT
CHLORAPREP W/TINT 26ML (MISCELLANEOUS) ×2 IMPLANT
CLIP TI LARGE 6 (CLIP) IMPLANT
CLIP TI MEDIUM 6 (CLIP) IMPLANT
CLOTH BEACON ORANGE TIMEOUT ST (SAFETY) ×2 IMPLANT
COVER SURGICAL LIGHT HANDLE (MISCELLANEOUS) ×2 IMPLANT
DRAIN PENROSE 1/2X36 STERILE (WOUND CARE) IMPLANT
DRAPE LAPAROSCOPIC ABDOMINAL (DRAPES) ×2 IMPLANT
DRAPE UTILITY 15X26 W/TAPE STR (DRAPE) ×4 IMPLANT
DRAPE WARM FLUID 44X44 (DRAPE) ×2 IMPLANT
DRSG MEPILEX BORDER 4X12 (GAUZE/BANDAGES/DRESSINGS) ×1 IMPLANT
ELECT BLADE 6.5 EXT (BLADE) ×2 IMPLANT
ELECT REM PT RETURN 9FT ADLT (ELECTROSURGICAL) ×2
ELECTRODE REM PT RTRN 9FT ADLT (ELECTROSURGICAL) ×1 IMPLANT
GLOVE BIOGEL PI IND STRL 7.0 (GLOVE) IMPLANT
GLOVE BIOGEL PI INDICATOR 7.0 (GLOVE) ×1
GLOVE SURG SIGNA 7.5 PF LTX (GLOVE) ×4 IMPLANT
GLOVE SURG SS PI 7.0 STRL IVOR (GLOVE) ×1 IMPLANT
GLOVE SURG SS PI 7.5 STRL IVOR (GLOVE) ×1 IMPLANT
GOWN PREVENTION PLUS XLARGE (GOWN DISPOSABLE) ×2 IMPLANT
GOWN STRL NON-REIN LRG LVL3 (GOWN DISPOSABLE) ×4 IMPLANT
GOWN STRL REIN XL XLG (GOWN DISPOSABLE) ×2 IMPLANT
KIT BASIN OR (CUSTOM PROCEDURE TRAY) ×2 IMPLANT
KIT ROOM TURNOVER OR (KITS) ×2 IMPLANT
LIGASURE IMPACT 36 18CM CVD LR (INSTRUMENTS) ×1 IMPLANT
NS IRRIG 1000ML POUR BTL (IV SOLUTION) ×4 IMPLANT
PACK GENERAL/GYN (CUSTOM PROCEDURE TRAY) ×2 IMPLANT
PAD ARMBOARD 7.5X6 YLW CONV (MISCELLANEOUS) ×4 IMPLANT
PLUG CATH AND CAP STER (CATHETERS) IMPLANT
RELOAD PROXIMATE 30MM BLUE (ENDOMECHANICALS) ×2 IMPLANT
RELOAD STAPLE 30 3.6 BLU REG (ENDOMECHANICALS) IMPLANT
RELOAD STAPLER LINEAR PROX 30 (STAPLE) ×1 IMPLANT
SPECIMEN JAR X LARGE (MISCELLANEOUS) ×2 IMPLANT
SPONGE GAUZE 4X4 12PLY (GAUZE/BANDAGES/DRESSINGS) ×2 IMPLANT
SPONGE LAP 18X18 X RAY DECT (DISPOSABLE) ×2 IMPLANT
STAPLER RELOAD LINEAR PROX 30 (STAPLE) ×2
STAPLER RELOADABLE 30 BLU REG (STAPLE) IMPLANT
STAPLER VISISTAT 35W (STAPLE) ×2 IMPLANT
SUCTION POOLE TIP (SUCTIONS) ×2 IMPLANT
SUT ETHILON 3 0 FSL (SUTURE) IMPLANT
SUT PDS AB 1 TP1 96 (SUTURE) ×4 IMPLANT
SUT SILK 2 0 SH CR/8 (SUTURE) ×4 IMPLANT
SUT SILK 2 0 TIES 10X30 (SUTURE) ×2 IMPLANT
SUT SILK 2 0SH CR/8 30 (SUTURE) IMPLANT
SUT SILK 3 0 SH CR/8 (SUTURE) ×3 IMPLANT
SUT SILK 3 0 TIES 10X30 (SUTURE) ×2 IMPLANT
SUT SILK 3 0SH CR/8 30 (SUTURE) ×1 IMPLANT
TAPE CLOTH SURG 4X10 WHT LF (GAUZE/BANDAGES/DRESSINGS) ×1 IMPLANT
TOWEL OR 17X24 6PK STRL BLUE (TOWEL DISPOSABLE) ×2 IMPLANT
TOWEL OR 17X26 10 PK STRL BLUE (TOWEL DISPOSABLE) ×2 IMPLANT
TRAY FOLEY CATH 14FRSI W/METER (CATHETERS) IMPLANT
UNDERPAD 30X30 INCONTINENT (UNDERPADS AND DIAPERS) ×2 IMPLANT
WATER STERILE IRR 1000ML POUR (IV SOLUTION) ×2 IMPLANT
YANKAUER SUCT BULB TIP NO VENT (SUCTIONS) ×2 IMPLANT

## 2012-05-08 NOTE — Interval H&P Note (Signed)
History and Physical Interval Note: no change in H and P  05/08/2012 10:25 AM  [name]  has presented today for surgery, with the diagnosis of gastric mass  The various methods of treatment have been discussed with the patient and family. After consideration of risks, benefits and other options for treatment, the patient has consented to  Procedure(s): Excision gastric mass, possible partial gastrectomy (N/A) as a surgical intervention .  The patient's history has been reviewed, patient examined, no change in status, stable for surgery.  I have reviewed the patient's chart and labs.  Questions were answered to the patient's satisfaction.     [name] A

## 2012-05-08 NOTE — Anesthesia Postprocedure Evaluation (Signed)
Anesthesia Post Note  Patient: [name]  Procedure(s) Performed: Procedure(s) (LRB): Excision gastric mass, possible partial gastrectomy (N/A)  Anesthesia type: General  Patient location: PACU  Post pain: Pain level controlled and Adequate analgesia  Post assessment: Post-op Vital signs reviewed, Patient's Cardiovascular Status Stable, Respiratory Function Stable, Patent Airway and Pain level controlled  Last Vitals:  Filed Vitals:   05/08/12 1445  BP: 120/62  Pulse: 84  Temp:   Resp: 28    Post vital signs: Reviewed and stable  Level of consciousness: awake, alert  and oriented  Complications: No apparent anesthesia complications

## 2012-05-08 NOTE — Transfer of Care (Signed)
Immediate Anesthesia Transfer of Care Note  Patient: [name]  Procedure(s) Performed: Procedure(s): Excision gastric mass, possible partial gastrectomy (N/A)  Patient Location: PACU  Anesthesia Type:General  Level of Consciousness: awake  Airway & Oxygen Therapy: Patient Spontanous Breathing and Patient connected to face mask oxygen  Post-op Assessment: Report given to PACU RN, Post -op Vital signs reviewed and stable and Patient moving all extremities  Post vital signs: Reviewed and stable  Complications: No apparent anesthesia complications

## 2012-05-08 NOTE — Addendum Note (Signed)
Addendum created 05/08/12 1523 by Jodell Cipro, CRNA   Modules edited: Anesthesia Blocks and Procedures

## 2012-05-08 NOTE — Anesthesia Procedure Notes (Signed)
Procedure Name: Intubation Date/Time: 05/08/2012 12:27 PM Performed by: Orvilla Fus A Pre-anesthesia Checklist: Patient identified, Emergency Drugs available, Suction available, Patient being monitored and Timeout performed Patient Re-evaluated:Patient Re-evaluated prior to inductionOxygen Delivery Method: Circle system utilized Preoxygenation: Pre-oxygenation with 100% oxygen Intubation Type: IV induction Ventilation: Mask ventilation with difficulty and Oral airway inserted - appropriate to patient size Laryngoscope Size: Hyacinth Meeker and 2 Grade View: Grade III Tube type: Oral Tube size: 7.0 mm Number of attempts: 2 (DLx1 MAC 3 CRNA, DLx1 Mil 2 MDA) Airway Equipment and Method: Stylet and Patient positioned with wedge pillow Placement Confirmation: positive ETCO2,  CO2 detector and ETT inserted through vocal cords under direct vision Secured at: 23 cm Tube secured with: Tape Dental Injury: Injury to lip and Teeth and Oropharynx as per pre-operative assessment  Difficulty Due To: Difficult Airway- due to large tongue and Difficult Airway- due to anterior larynx

## 2012-05-08 NOTE — Preoperative (Signed)
Beta Blockers   Reason not to administer Beta Blockers:Not Applicable 

## 2012-05-08 NOTE — Anesthesia Preprocedure Evaluation (Signed)
Anesthesia Evaluation  Patient identified by MRN, date of birth, ID band Patient awake    Reviewed: Allergy & Precautions, H&P , NPO status , Patient's Chart, lab work & pertinent test results  History of Anesthesia Complications (+) PONV  Airway Mallampati: II  Neck ROM: full    Dental   Pulmonary shortness of breath,          Cardiovascular hypertension,     Neuro/Psych    GI/Hepatic GERD-  ,(+) Hepatitis -, Autoimmune  Endo/Other  Morbid obesity  Renal/GU      Musculoskeletal  (+) Arthritis -,   Abdominal   Peds  Hematology   Anesthesia Other Findings   Reproductive/Obstetrics                           Anesthesia Physical Anesthesia Plan  ASA: III  Anesthesia Plan: General   Post-op Pain Management:    Induction: Intravenous  Airway Management Planned: Oral ETT  Additional Equipment:   Intra-op Plan:   Post-operative Plan: Extubation in OR  Informed Consent: I have reviewed the patients History and Physical, chart, labs and discussed the procedure including the risks, benefits and alternatives for the proposed anesthesia with the patient or authorized representative who has indicated his/her understanding and acceptance.     Plan Discussed with: CRNA and Surgeon  Anesthesia Plan Comments:         Anesthesia Quick Evaluation

## 2012-05-08 NOTE — Op Note (Signed)
Excision gastric mass, possible partial gastrectomy  Procedure Note  [name] 05/08/2012   Pre-op Diagnosis: gastric mass     Post-op Diagnosis: same  Procedure(s): Partial Gastrectomy  Surgeon(s): Shelly Rubenstein, MD  Anesthesia: General  Staff:  Circulator: Doy Mince, RN Relief Circulator: Isidoro Donning, RN Scrub Person: Leighton Parody, CST  Estimated Blood Loss: Minimal               Specimens: sent to path          [facility]   Date: 05/08/2012  Time: 1:48 PM

## 2012-05-09 ENCOUNTER — Inpatient Hospital Stay (HOSPITAL_COMMUNITY): Payer: Commercial Managed Care - PPO

## 2012-05-09 ENCOUNTER — Encounter (HOSPITAL_COMMUNITY): Payer: Self-pay | Admitting: Surgery

## 2012-05-09 LAB — CBC
HCT: 17.5 % — ABNORMAL LOW (ref 36.0–46.0)
MCHC: 36 g/dL (ref 30.0–36.0)
Platelets: 145 10*3/uL — ABNORMAL LOW (ref 150–400)
RDW: 18.2 % — ABNORMAL HIGH (ref 11.5–15.5)

## 2012-05-09 LAB — HEMOGLOBIN AND HEMATOCRIT, BLOOD
HCT: 21.7 % — ABNORMAL LOW (ref 36.0–46.0)
Hemoglobin: 7.8 g/dL — ABNORMAL LOW (ref 12.0–15.0)

## 2012-05-09 LAB — BASIC METABOLIC PANEL
BUN: 11 mg/dL (ref 6–23)
Creatinine, Ser: 0.75 mg/dL (ref 0.50–1.10)
GFR calc Af Amer: >90 mL/min (ref >90)
GFR calc non Af Amer: >90 mL/min (ref >90)
Potassium: 4.5 mEq/L (ref 3.5–5.1)

## 2012-05-09 MED ORDER — ACETAMINOPHEN 10 MG/ML IV SOLN
1000.0000 mg | Freq: Once | INTRAVENOUS | Status: AC
  Administered 2012-05-09: 1000 mg via INTRAVENOUS
  Filled 2012-05-09: qty 100

## 2012-05-09 NOTE — Op Note (Signed)
[name], Cochran NO.:  [id number]  MEDICAL RECORD NO.:  [id number]  LOCATION:  6N12C                        FACILITY:  [facility]  PHYSICIAN:  Abigail Miyamoto, M.D. DATE OF BIRTH:  12-05-1955  DATE OF PROCEDURE:  05/08/2012 DATE OF DISCHARGE:                              OPERATIVE REPORT   PREOPERATIVE DIAGNOSIS:  Gastric mass.  POSTOPERATIVE DIAGNOSIS:  Gastric mass.  PROCEDURE:  Partial gastrectomy.  SURGEON:  Abigail Miyamoto, M.D.  ANESTHESIA:  General endotracheal anesthesia.  ESTIMATED BLOOD LOSS:  Minimal.  INDICATIONS:  This is a 57 year old female who presented with long-term anemia.  She had a recent endoscopy showing a submucosal mass in the posterior wall of the stomach pre-pyloric CAT scan confirmed this mass and showed that it had been present since at least 2006 and had been slowly growing.  There were no other findings consistent with adenopathy.  A biopsy of the mass suggested carcinoid.  At this point, decision was made to proceed to the operating room.  FINDINGS:  The patient was found to have a 2-cm submucosal mass in the posterior wall of the stomach, which is pre-pyloric.  There was no adenopathy involved.  The mass was mobile.  Decision was made to proceed with a limited partial gastrectomy of the mass.  PROCEDURE IN DETAIL:  The patient was brought to the operating room, identified as [name].  She was placed supine on the operative table and general anesthesia was induced.  Her abdomen was then prepped and draped in usual sterile fashion.  Using a #10 blade, a midline incision was then created in the upper abdomen.  This was taken down through the fascia with electrocautery.  The peritoneum was then opened to the entire length of the incision.  Upon entering the abdomen, the patient was found to have a large amount of thickened fatty omentum.  I had to take down adhesions to the abdominal wall slightly.  I then had to  take down the falciform ligament as well.  Stomach was then easily identified.  I could palpate the posterior mass to the anterior wall of the stomach.  I had to take down adhesions of the stomach and duodenum to the liver from previous surgery with the electrocautery.  I then entered the sac with electrocautery and using this and LigaSure was able to visualize the entire posterior wall of the stomach.  I could then visualize the mass, which was quite mobile and approximately 1.5 to 2 cm in size.  Once I was able to mobilize the duodenum further and elevate the stomach, it became apparent that I could perform a limited gastric resection at the area of the mass.  I out a 2-0 silk suture into the mass, elevated it further.  I then excised the mass in its entirety with electrocautery.  I then closed the open gastrotomy with 2 separate firings of the TA 30 stapler.  I then reinforced the staple line with interrupted 2-0 silk sutures.  Hemostasis appeared to be achieved.  The mass was quite benign in appearance.  It was sent to Pathology for evaluation.  I confirmed good placement of the nasogastric tube.  I then thoroughly  irrigated the abdomen with several liters of normal saline. Hemostasis appeared to be achieved.  I then closed the patient's midline fascia with a running #1 looped PDS suture. Skin was then closed skin staples after irrigating the wound.  The patient tolerated the procedure well.  All counts were correct at the end of procedure.  The patient was then extubated in the operating room and taken in a stable condition to the recovery room.     Abigail Miyamoto, M.D.     DB/MEDQ  D:  05/08/2012  T:  05/09/2012  Job:  [id number]

## 2012-05-09 NOTE — Progress Notes (Signed)
UR completed 

## 2012-05-09 NOTE — Progress Notes (Signed)
1 Day Post-Op  Subjective: POD#1 comfortable  Objective: Vital signs in last 24 hours: Temp:  [97.9 F (36.6 C)-98.5 F (36.9 C)] 98.4 F (36.9 C) (03/27 0558) Pulse Rate:  [79-105] 88 (03/27 0609) Resp:  [11-34] 21 (03/27 0558) BP: (85-133)/(47-76) 92/60 mmHg (03/27 0609) SpO2:  [92 %-99 %] 96 % (03/27 0558) Weight:  [244 lb 11.4 oz (111 kg)] 244 lb 11.4 oz (111 kg) (03/26 1539) Last BM Date: 05/08/12  Intake/Output from previous day: 03/26 0701 - 03/27 0700 In: 2185 [I.V.:2155; NG/GT:30] Out: 2375 [Urine:2175; Emesis/NG output:200] Intake/Output this shift: Total I/O In: 555 [I.V.:555] Out: 1450 [Urine:1300; Emesis/NG output:150]  Lungs clear Abdomen soft, dressing dry  Lab Results:   Recent Labs  05/08/12 1000 05/09/12 0520  WBC 2.1* 1.8*  HGB 7.1* 6.3*  HCT 19.3* 17.5*  PLT 141* 145*   BMET  Recent Labs  05/08/12 1000  NA 143  K 3.7  CL 108  CO2 27  GLUCOSE 104*  BUN 14  CREATININE 0.81  CALCIUM 9.3   PT/INR No results found for this basename: LABPROT, INR,  in the last 72 hours ABG No results found for this basename: PHART, PCO2, PO2, HCO3,  in the last 72 hours  Studies/Results: No results found.  Anti-infectives: Anti-infectives   Start     Dose/Rate Route Frequency Ordered Stop   05/08/12 0600  ciprofloxacin (CIPRO) IVPB 400 mg     400 mg 200 mL/hr over 60 Minutes Intravenous On call to O.R. 05/07/12 1353 05/08/12 1228      Assessment/Plan: s/p Procedure(s): Excision gastric mass, possible partial gastrectomy (N/A)  Chronic anemia, lower than preop.  Suspect dilutional.  Has been getting multiple transfusions for her anemia.  Still uncertain whether removing mass will solve the problem.  Will transfuse 2 units PRBC today Continue NG D/c foley  LOS: 1 day    Elois Averitt A 05/09/2012

## 2012-05-09 NOTE — Progress Notes (Signed)
Rn called Md about cxr, dark urine output, and accessory muscle use while breathing. Md aware of situation. Orders placed for post transfusion H and H. Rn will continue to monitor patient. VSS

## 2012-05-10 LAB — TYPE AND SCREEN
ABO/RH(D): A POS
Antibody Screen: NEGATIVE
Unit division: 0

## 2012-05-10 LAB — CBC
MCV: 89.8 fL (ref 78.0–100.0)
Platelets: 137 10*3/uL — ABNORMAL LOW (ref 150–400)
RBC: 2.35 MIL/uL — ABNORMAL LOW (ref 3.87–5.11)
RDW: 17.8 % — ABNORMAL HIGH (ref 11.5–15.5)
WBC: 2.3 10*3/uL — ABNORMAL LOW (ref 4.0–10.5)

## 2012-05-10 LAB — BASIC METABOLIC PANEL
CO2: 24 mEq/L (ref 19–32)
Chloride: 103 mEq/L (ref 96–112)
Creatinine, Ser: 0.71 mg/dL (ref 0.50–1.10)
GFR calc Af Amer: >90 mL/min (ref >90)
Potassium: 4.2 mEq/L (ref 3.5–5.1)

## 2012-05-10 MED ORDER — ACETAMINOPHEN 650 MG RE SUPP
650.0000 mg | RECTAL | Status: DC | PRN
  Administered 2012-05-12: 650 mg via RECTAL
  Filled 2012-05-10: qty 1

## 2012-05-10 MED ORDER — ACETAMINOPHEN 10 MG/ML IV SOLN
1000.0000 mg | Freq: Four times a day (QID) | INTRAVENOUS | Status: DC
  Filled 2012-05-10 (×4): qty 100

## 2012-05-10 MED ORDER — BIOTENE DRY MOUTH MT LIQD
15.0000 mL | Freq: Two times a day (BID) | OROMUCOSAL | Status: DC
  Administered 2012-05-10 – 2012-05-14 (×8): 15 mL via OROMUCOSAL

## 2012-05-10 MED ORDER — CHLORHEXIDINE GLUCONATE 0.12 % MT SOLN
15.0000 mL | Freq: Two times a day (BID) | OROMUCOSAL | Status: DC
  Administered 2012-05-10 – 2012-05-14 (×9): 15 mL via OROMUCOSAL
  Filled 2012-05-10 (×7): qty 15

## 2012-05-10 MED ORDER — ACETAMINOPHEN 10 MG/ML IV SOLN
1000.0000 mg | Freq: Once | INTRAVENOUS | Status: AC
  Administered 2012-05-10: 1000 mg via INTRAVENOUS
  Filled 2012-05-10: qty 100

## 2012-05-10 NOTE — Progress Notes (Signed)
2 Days Post-Op  Subjective: POD#2 Comfortable Foley replaced for urinary retention  Objective: Vital signs in last 24 hours: Temp:  [97.8 F (36.6 C)-100.4 F (38 C)] 98.5 F (36.9 C) (03/28 0607) Pulse Rate:  [11-113] 113 (03/28 0607) Resp:  [16-28] 19 (03/28 0607) BP: (98-142)/(54-79) 129/74 mmHg (03/28 0607) SpO2:  [9 %-100 %] 90 % (03/28 0607) Last BM Date: 05/08/12  Intake/Output from previous day: 03/27 0701 - 03/28 0700 In: 2473.8 [I.V.:2349.3; Blood:124.5] Out: 1875 [Urine:1375; Emesis/NG output:500] Intake/Output this shift:    Lungs with mild decrease bilateral bases CV tachy Abdomen obese but soft, dressing dry  Lab Results:   Recent Labs  05/09/12 0520 05/09/12 1931 05/10/12 0602  WBC 1.8*  --  2.3*  HGB 6.3* 7.8* 7.7*  HCT 17.5* 21.7* 21.1*  PLT 145*  --  137*   BMET  Recent Labs  05/09/12 0520 05/10/12 0602  NA 139 137  K 4.5 4.2  CL 105 103  CO2 27 24  GLUCOSE 135* 108*  BUN 11 11  CREATININE 0.75 0.71  CALCIUM 9.1 9.4   PT/INR No results found for this basename: LABPROT, INR,  in the last 72 hours ABG No results found for this basename: PHART, PCO2, PO2, HCO3,  in the last 72 hours  Studies/Results: Dg Chest Port 1 View  05/09/2012  **ADDENDUM** CREATED: 05/09/2012 18:13:45  PORTABLE CHEST - 1 VIEW  Comparison: 05/02/2012  Findings::  There is a nasogastric tube with tip in the stomach. The lung volumes are low and there is asymmetric elevation of the right hemidiaphragm.  Atelectasis is noted within both lung bases.  IMPRESSION: 1.  Bibasilar atelectasis. 2.  Low lung volumes with asymmetric elevation of the right hemidiaphragm.  **END ADDENDUM** SIGNED BY: Rosealee Albee, M.D.   05/09/2012  *RADIOLOGY REPORT*  Clinical Data: Shortness of breath  PORTABLE CHEST - 1 VIEW  Comparison: 05/02/2012  Findings: The cardiac shadow is mildly enlarged.  The overall inspiratory effort is poor.  The nasogastric catheter is noted within the  stomach.  The sizable effusion or pneumothorax is noted. Some minimal right basilar atelectasis is seen.  IMPRESSION: Basilar atelectasis.  Nasogastric catheter as described.   Original Report Authenticated By: Alcide Clever, M.D.     Anti-infectives: Anti-infectives   Start     Dose/Rate Route Frequency Ordered Stop   05/08/12 0600  ciprofloxacin (CIPRO) IVPB 400 mg     400 mg 200 mL/hr over 60 Minutes Intravenous On call to O.R. 05/07/12 1353 05/08/12 1228      Assessment/Plan: s/p Procedure(s): Excision gastric mass, possible partial gastrectomy (N/A)   Hgb/Hct increased post transfusion.  Will repeat in a.m.  Still may need more PRBC Urinary retention.  Will leave foley until tomorrow morning.  Suspect anesthesia related. Encourage incentive spirometery. Leave NG  LOS: 2 days    Nnenna Meador A 05/10/2012

## 2012-05-11 ENCOUNTER — Inpatient Hospital Stay (HOSPITAL_COMMUNITY): Payer: Commercial Managed Care - PPO

## 2012-05-11 LAB — BLOOD GAS, ARTERIAL
Acid-base deficit: 3.3 mmol/L — ABNORMAL HIGH (ref 0.0–2.0)
Drawn by: 312971
FIO2: 0.4 %
pCO2 arterial: 34.7 mmHg — ABNORMAL LOW (ref 35.0–45.0)
pH, Arterial: 7.395 (ref 7.350–7.450)
pO2, Arterial: 82.1 mmHg (ref 80.0–100.0)

## 2012-05-11 LAB — CBC
Hemoglobin: 7.4 g/dL — ABNORMAL LOW (ref 12.0–15.0)
MCH: 32.5 pg (ref 26.0–34.0)
Platelets: 134 10*3/uL — ABNORMAL LOW (ref 150–400)
RBC: 2.28 MIL/uL — ABNORMAL LOW (ref 3.87–5.11)
WBC: 2.6 10*3/uL — ABNORMAL LOW (ref 4.0–10.5)

## 2012-05-11 MED ORDER — ACETAMINOPHEN 10 MG/ML IV SOLN
1000.0000 mg | Freq: Four times a day (QID) | INTRAVENOUS | Status: AC
  Administered 2012-05-11 – 2012-05-12 (×4): 1000 mg via INTRAVENOUS
  Filled 2012-05-11 (×3): qty 100

## 2012-05-11 NOTE — Progress Notes (Signed)
Called to patients room to assess patient low sat issue. Upon entering room patient was breathing at a rate of 28-32 with sats 91-88%. breathing heavily through her mouth and having a long expiratory phase. Patient has BBS clear throughout and has been producing urine. NO signs of fluid overload. Patient does have swelling in her hands at this time MD and RN aware. Placed patient on Venti Mask at 40% and sats increased to 94-95% and respiratory rate decreased to mid 20's. Patient is having pain at this time and is on PCA pump. Also RN changed patients CO2 detector for better readings. Will continue to monitor if needed.

## 2012-05-11 NOTE — Progress Notes (Signed)
3 Days Post-Op   Assessment: s/p Procedure(s): Excision gastric mass, possible partial gastrectomy Patient Active Problem List  Diagnosis  . Anemia requiring transfusions  . GI bleed  . HTN (hypertension)  . Unspecified deficiency anemia  . Gastric mass    Dyspnea, this am. Abd seems OK  Plan: Will get CXR, may need to move to stepdown, will d/c NG to see if that helps  Subjective: Mild pain, mostly in back. Has been more short of breath this am. Up in a chair now. Foley had to be replaced  Objective: Vital signs in last 24 hours: Temp:  [97.4 F (36.3 C)-101.7 F (38.7 C)] 98.4 F (36.9 C) (03/29 0923) Pulse Rate:  [110-119] 111 (03/29 0923) Resp:  [13-31] 24 (03/29 0923) BP: (113-136)/(57-86) 136/82 mmHg (03/29 0923) SpO2:  [93 %-98 %] 97 % (03/29 0923)   Intake/Output from previous day: 03/28 0701 - 03/29 0700 In: 2343.3 [I.V.:2343.3] Out: 2350 [Urine:2150; Emesis/NG output:200]  General appearance: alert, cooperative, fatigued and mild distress Resp: clear to auscultation bilaterally GI: Soft, mild tender luq  Incision: healing well  Lab Results:   Recent Labs  05/10/12 0602 05/11/12 0743  WBC 2.3* 2.6*  HGB 7.7* 7.4*  HCT 21.1* 20.7*  PLT 137* 134*   BMET  Recent Labs  05/09/12 0520 05/10/12 0602  NA 139 137  K 4.5 4.2  CL 105 103  CO2 27 24  GLUCOSE 135* 108*  BUN 11 11  CREATININE 0.75 0.71  CALCIUM 9.1 9.4    MEDS, Scheduled . antiseptic oral rinse  15 mL Mouth Rinse q12n4p  . chlorhexidine  15 mL Mouth Rinse BID  . heparin  5,000 Units Subcutaneous Q8H  . HYDROmorphone PCA 0.3 mg/mL   Intravenous Q4H  . pantoprazole (PROTONIX) IV  40 mg Intravenous Q24H  . polyvinyl alcohol  1 drop Both Eyes Daily    Studies/Results: Dg Chest Port 1 View  05/09/2012  **ADDENDUM** CREATED: 05/09/2012 18:13:45  PORTABLE CHEST - 1 VIEW  Comparison: 05/02/2012  Findings::  There is a nasogastric tube with tip in the stomach. The lung volumes are  low and there is asymmetric elevation of the right hemidiaphragm.  Atelectasis is noted within both lung bases.  IMPRESSION: 1.  Bibasilar atelectasis. 2.  Low lung volumes with asymmetric elevation of the right hemidiaphragm.  **END ADDENDUM** SIGNED BY: Rosealee Albee, M.D.   05/09/2012  *RADIOLOGY REPORT*  Clinical Data: Shortness of breath  PORTABLE CHEST - 1 VIEW  Comparison: 05/02/2012  Findings: The cardiac shadow is mildly enlarged.  The overall inspiratory effort is poor.  The nasogastric catheter is noted within the stomach.  The sizable effusion or pneumothorax is noted. Some minimal right basilar atelectasis is seen.  IMPRESSION: Basilar atelectasis.  Nasogastric catheter as described.   Original Report Authenticated By: Alcide Clever, M.D.       LOS: 3 days     Currie Paris, MD, [facility], [place] [phone]   05/11/2012 10:24 AM

## 2012-05-12 ENCOUNTER — Inpatient Hospital Stay (HOSPITAL_COMMUNITY): Payer: Commercial Managed Care - PPO

## 2012-05-12 LAB — CBC
MCV: 93.8 fL (ref 78.0–100.0)
Platelets: 127 10*3/uL — ABNORMAL LOW (ref 150–400)
RBC: 2.1 MIL/uL — ABNORMAL LOW (ref 3.87–5.11)
RDW: 17.4 % — ABNORMAL HIGH (ref 11.5–15.5)
WBC: 2.8 10*3/uL — ABNORMAL LOW (ref 4.0–10.5)

## 2012-05-12 LAB — BASIC METABOLIC PANEL
CO2: 28 mEq/L (ref 19–32)
Calcium: 9 mg/dL (ref 8.4–10.5)
Creatinine, Ser: 0.73 mg/dL (ref 0.50–1.10)
GFR calc non Af Amer: >90 mL/min (ref >90)
Glucose, Bld: 128 mg/dL — ABNORMAL HIGH (ref 70–99)

## 2012-05-12 LAB — PREPARE RBC (CROSSMATCH)

## 2012-05-12 MED ORDER — CIPROFLOXACIN IN D5W 400 MG/200ML IV SOLN
400.0000 mg | Freq: Two times a day (BID) | INTRAVENOUS | Status: DC
  Administered 2012-05-12 – 2012-05-16 (×10): 400 mg via INTRAVENOUS
  Filled 2012-05-12 (×14): qty 200

## 2012-05-12 MED ORDER — KETOROLAC TROMETHAMINE 30 MG/ML IJ SOLN
30.0000 mg | Freq: Once | INTRAMUSCULAR | Status: AC
  Administered 2012-05-12: 30 mg via INTRAVENOUS

## 2012-05-12 MED ORDER — DIPHENHYDRAMINE HCL 50 MG/ML IJ SOLN
INTRAMUSCULAR | Status: AC
  Administered 2012-05-12: 25 mg
  Filled 2012-05-12: qty 1

## 2012-05-12 MED ORDER — DIPHENHYDRAMINE HCL 50 MG/ML IJ SOLN
25.0000 mg | Freq: Once | INTRAMUSCULAR | Status: AC
  Administered 2012-05-12: 25 mg via INTRAVENOUS

## 2012-05-12 MED ORDER — KETOROLAC TROMETHAMINE 30 MG/ML IJ SOLN
INTRAMUSCULAR | Status: AC
  Filled 2012-05-12: qty 1

## 2012-05-12 MED ORDER — FUROSEMIDE 10 MG/ML IJ SOLN
20.0000 mg | Freq: Once | INTRAMUSCULAR | Status: AC
  Administered 2012-05-12: 20 mg via INTRAVENOUS
  Filled 2012-05-12 (×2): qty 2

## 2012-05-12 NOTE — Progress Notes (Signed)
4 Days Post-Op  Subjective: Confused overnight but now awake and alert Denies SOB No nausea with NG out  Objective: Vital signs in last 24 hours: Temp:  [98.2 F (36.8 C)-101.1 F (38.4 C)] 99.3 F (37.4 C) (03/30 0557) Pulse Rate:  [99-119] 105 (03/30 0557) Resp:  [19-30] 20 (03/30 0557) BP: (119-151)/(57-85) 119/59 mmHg (03/30 0557) SpO2:  [93 %-100 %] 94 % (03/30 0557) FiO2 (%):  [40 %] 40 % (03/29 1200) Last BM Date: 05/08/12  Intake/Output from previous day: 03/29 0701 - 03/30 0700 In: 0  Out: 1575 [Urine:1575] Intake/Output this shift:   Lungs clear Abdomen soft, incision clean  Lab Results:   Recent Labs  05/11/12 0743 05/12/12 0540  WBC 2.6* 2.8*  HGB 7.4* 6.9*  HCT 20.7* 19.7*  PLT 134* 127*   BMET  Recent Labs  05/10/12 0602  NA 137  K 4.2  CL 103  CO2 24  GLUCOSE 108*  BUN 11  CREATININE 0.71  CALCIUM 9.4   PT/INR No results found for this basename: LABPROT, INR,  in the last 72 hours ABG  Recent Labs  05/11/12 1225  PHART 7.395  HCO3 20.8    Studies/Results: Dg Chest 2 View  05/11/2012  *RADIOLOGY REPORT*  Clinical Data: Dyspnea and reduced oxygen saturations.  CHEST - 2 VIEW  Comparison: 05/09/2012  Findings: Two views of the chest demonstrate elevation of the right hemidiaphragm.  The nasogastric tube has been removed.  There is concern for increased densities in the medial right upper lung that could represent atelectasis or airspace disease. Probable left basilar atelectasis. The heart size is upper limits of normal and probably accentuated by the low lung volumes.  IMPRESSION: Concern for atelectasis or airspace disease in the medial right upper lung.  Elevated right hemidiaphragm.   Original Report Authenticated By: Richarda Overlie, M.D.     Anti-infectives: Anti-infectives   Start     Dose/Rate Route Frequency Ordered Stop   05/08/12 0600  ciprofloxacin (CIPRO) IVPB 400 mg     400 mg 200 mL/hr over 60 Minutes Intravenous On call  to O.R. 05/07/12 1353 05/08/12 1228      Assessment/Plan: s/p Procedure(s): Excision gastric mass, possible partial gastrectomy (N/A)  Hgb/Hct down.  Chronic anemia as well as part dilutional.  Still will not know for now if mass was source of GI bleed.  I do not think she has any post op bleeding. Will transfuse 2 units PRBC and try to diurese. D/C foley  LOS: 4 days    Stefano Trulson A 05/12/2012

## 2012-05-12 NOTE — Progress Notes (Signed)
Pt noted with temp 101.5 pt post transfusion pt denies c/o  Dr  Carolynne Edouard Notified   Will monitor

## 2012-05-12 NOTE — Progress Notes (Signed)
Oral temp 103.3   cxr result low volumes bibasilar atelectisis  DR Carolynne Edouard Paged

## 2012-05-12 NOTE — Progress Notes (Addendum)
Pt received tylenol supp for temp 102.3 at 1745  Temp   At 1830 temp 102.4 bp 133/68 r 32 o2  Sat 94  Dr Carolynne Edouard  Paged order received for stat cxr   Will continue to monitor as per MD.  Rapid response notified, who assessed patient and stated transfer to higher level of care was not necessary at this time.  Patient otherwise not symptomatic.  No chills, tremors, flank pain, etc. which would indicate hemolytic reaction.

## 2012-05-12 NOTE — Progress Notes (Signed)
Pt temp 103.3 pt desat 89 on 3 1/2 liters N/C.  bp 118/67 Dr Carolynne Edouard notified of the changes. Pt give toradol 30mg  IV/ Benadryl 25mg  IV/ rapid response notified/ report called/ Pt transferred to ICU Rm 2108.

## 2012-05-13 LAB — URINALYSIS, ROUTINE W REFLEX MICROSCOPIC
Ketones, ur: NEGATIVE mg/dL
Protein, ur: NEGATIVE mg/dL
Urobilinogen, UA: 4 mg/dL — ABNORMAL HIGH (ref 0.0–1.0)

## 2012-05-13 LAB — CBC
Hemoglobin: 7.5 g/dL — ABNORMAL LOW (ref 12.0–15.0)
MCH: 31.9 pg (ref 26.0–34.0)
MCHC: 34.9 g/dL (ref 30.0–36.0)
MCV: 91.5 fL (ref 78.0–100.0)
Platelets: 120 10*3/uL — ABNORMAL LOW (ref 150–400)
RBC: 2.35 MIL/uL — ABNORMAL LOW (ref 3.87–5.11)

## 2012-05-13 LAB — URINE MICROSCOPIC-ADD ON

## 2012-05-13 MED ORDER — ACETAMINOPHEN 325 MG PO TABS: 650.0000 mg | ORAL_TABLET | Freq: Four times a day (QID) | ORAL | Status: DC | PRN

## 2012-05-13 MED ORDER — OXYCODONE HCL 5 MG PO TABS
5.0000 mg | ORAL_TABLET | ORAL | Status: DC | PRN
  Administered 2012-05-13 (×2): 10 mg via ORAL
  Administered 2012-05-15: 5 mg via ORAL
  Administered 2012-05-16 (×2): 10 mg via ORAL
  Filled 2012-05-13 (×5): qty 2

## 2012-05-13 NOTE — Progress Notes (Signed)
5 Days Post-Op  Subjective: Events of evening noted.  She is much better now and without complaints Denies SOB Tolerating clears  Objective: Vital signs in last 24 hours: Temp:  [98.2 F (36.8 C)-103.3 F (39.6 C)] 98.4 F (36.9 C) (03/31 0411) Pulse Rate:  [90-120] 100 (03/31 0700) Resp:  [15-33] 23 (03/31 0700) BP: (109-136)/(60-70) 114/67 mmHg (03/31 0700) SpO2:  [89 %-98 %] 97 % (03/31 0700) FiO2 (%):  [95 %] 95 % (03/30 2113) Weight:  [248 lb 10.9 oz (112.8 kg)] 248 lb 10.9 oz (112.8 kg) (03/30 2138) Last BM Date: 05/08/12  Intake/Output from previous day: 03/30 0701 - 03/31 0700 In: 770 [I.V.:200; Blood:370; IV Piggyback:200] Out: 1975 [Urine:1975] Intake/Output this shift:    Lungs mostly clear Abdomen soft, non tender  Lab Results:   Recent Labs  05/12/12 0540 05/13/12 0411  WBC 2.8* 3.8*  HGB 6.9* 7.5*  HCT 19.7* 21.5*  PLT 127* 120*   BMET  Recent Labs  05/12/12 2025  NA 134*  K 3.5  CL 98  CO2 28  GLUCOSE 128*  BUN 10  CREATININE 0.73  CALCIUM 9.0   PT/INR No results found for this basename: LABPROT, INR,  in the last 72 hours ABG  Recent Labs  05/11/12 1225  PHART 7.395  HCO3 20.8    Studies/Results: Dg Chest 2 View  05/11/2012  *RADIOLOGY REPORT*  Clinical Data: Dyspnea and reduced oxygen saturations.  CHEST - 2 VIEW  Comparison: 05/09/2012  Findings: Two views of the chest demonstrate elevation of the right hemidiaphragm.  The nasogastric tube has been removed.  There is concern for increased densities in the medial right upper lung that could represent atelectasis or airspace disease. Probable left basilar atelectasis. The heart size is upper limits of normal and probably accentuated by the low lung volumes.  IMPRESSION: Concern for atelectasis or airspace disease in the medial right upper lung.  Elevated right hemidiaphragm.   Original Report Authenticated By: Richarda Overlie, M.D.    Dg Chest Port 1 View  05/12/2012  *RADIOLOGY REPORT*   Clinical Data: Fever  PORTABLE CHEST - 1 VIEW  Comparison: 05/11/2012  Findings: Low lung volumes.  Bibasilar atelectasis.  Prominent cardiac silhouette is likely due to low volumes.  Large body habitus.  No pneumothorax.  IMPRESSION: Low volumes and bibasilar atelectasis.   Original Report Authenticated By: Jolaine Click, M.D.     Anti-infectives: Anti-infectives   Start     Dose/Rate Route Frequency Ordered Stop   05/12/12 0745  ciprofloxacin (CIPRO) IVPB 400 mg     400 mg 200 mL/hr over 60 Minutes Intravenous Every 12 hours 05/12/12 0733     05/08/12 0600  ciprofloxacin (CIPRO) IVPB 400 mg     400 mg 200 mL/hr over 60 Minutes Intravenous On call to O.R. 05/07/12 1353 05/08/12 1228      Assessment/Plan: s/p Procedure(s): Excision gastric mass, possible partial gastrectomy (N/A)  Possible transfusion reaction despite multiple transfusions over past months. If fever persists. Will CT to r/o intrabd abscess Transfer to floor Advance po  LOS: 5 days    Artesha Wemhoff A 05/13/2012

## 2012-05-14 LAB — URINE CULTURE
Colony Count: NO GROWTH
Culture: NO GROWTH

## 2012-05-14 NOTE — Progress Notes (Signed)
6 Days Post-Op  Subjective: POD#6 Comfortable Not ambulating Has some heartburn but no nausea or bloating  Objective: Vital signs in last 24 hours: Temp:  [99.1 F (37.3 C)-100.8 F (38.2 C)] 99.1 F (37.3 C) (04/01 0604) Pulse Rate:  [93-104] 93 (04/01 0604) Resp:  [18-31] 18 (04/01 0604) BP: (53-126)/(26-75) 115/63 mmHg (04/01 0604) SpO2:  [94 %-100 %] 96 % (04/01 0604) Last BM Date: 05/08/12  Intake/Output from previous day: 03/31 0701 - 04/01 0700 In: 1015 [P.O.:295; I.V.:320; IV Piggyback:400] Out: 300 [Urine:300] Intake/Output this shift:    Lungs mostly clear Abdomen soft  Lab Results:   Recent Labs  05/12/12 0540 05/13/12 0411  WBC 2.8* 3.8*  HGB 6.9* 7.5*  HCT 19.7* 21.5*  PLT 127* 120*   BMET  Recent Labs  05/12/12 2025  NA 134*  K 3.5  CL 98  CO2 28  GLUCOSE 128*  BUN 10  CREATININE 0.73  CALCIUM 9.0   PT/INR No results found for this basename: LABPROT, INR,  in the last 72 hours ABG  Recent Labs  05/11/12 1225  PHART 7.395  HCO3 20.8    Studies/Results: Dg Chest Port 1 View  05/12/2012  *RADIOLOGY REPORT*  Clinical Data: Fever  PORTABLE CHEST - 1 VIEW  Comparison: 05/11/2012  Findings: Low lung volumes.  Bibasilar atelectasis.  Prominent cardiac silhouette is likely due to low volumes.  Large body habitus.  No pneumothorax.  IMPRESSION: Low volumes and bibasilar atelectasis.   Original Report Authenticated By: Jolaine Click, M.D.     Anti-infectives: Anti-infectives   Start     Dose/Rate Route Frequency Ordered Stop   05/12/12 0745  ciprofloxacin (CIPRO) IVPB 400 mg     400 mg 200 mL/hr over 60 Minutes Intravenous Every 12 hours 05/12/12 0733     05/08/12 0600  ciprofloxacin (CIPRO) IVPB 400 mg     400 mg 200 mL/hr over 60 Minutes Intravenous On call to O.R. 05/07/12 1353 05/08/12 1228      Assessment/Plan: s/p Procedure(s): Excision gastric mass, possible partial gastrectomy (N/Cochran)   Regular diet ambulate  LOS: 6 days     [name] 05/14/2012

## 2012-05-15 MED ORDER — POLYETHYLENE GLYCOL 3350 17 G PO PACK
17.0000 g | PACK | Freq: Every day | ORAL | Status: DC
  Administered 2012-05-15 – 2012-05-16 (×2): 17 g via ORAL
  Filled 2012-05-15 (×3): qty 1

## 2012-05-15 MED ORDER — ALUM & MAG HYDROXIDE-SIMETH 200-200-20 MG/5ML PO SUSP
30.0000 mL | ORAL | Status: DC | PRN
  Administered 2012-05-15 – 2012-05-16 (×3): 30 mL via ORAL
  Filled 2012-05-15 (×3): qty 30

## 2012-05-15 MED ORDER — METOCLOPRAMIDE HCL 5 MG/ML IJ SOLN: 5.0000 mg | Freq: Three times a day (TID) | INTRAMUSCULAR | Status: DC | PRN

## 2012-05-15 NOTE — Progress Notes (Signed)
Pt's T 100.9, encouraged incentive spirometer and ambulation,will continue to monitor.

## 2012-05-15 NOTE — Progress Notes (Signed)
7 Days Post-Op  Subjective: Had some gas pain and heartburn. Passing some flatus Denies nausea  Objective: Vital signs in last 24 hours: Temp:  [98.2 F (36.8 C)-100.9 F (38.3 C)] 99.7 F (37.6 C) (04/02 0601) Pulse Rate:  [86-101] 99 (04/02 0601) Resp:  [18] 18 (04/02 0601) BP: (113-122)/(54-65) 122/60 mmHg (04/02 0601) SpO2:  [92 %-98 %] 96 % (04/02 0601) Last BM Date: 05/08/12  Intake/Output from previous day: 04/01 0701 - 04/02 0700 In: 1120 [P.O.:600; I.V.:320; IV Piggyback:200] Out: 300 [Urine:300] Intake/Output this shift:    Lungs with mild decrease at bases CV RRR Abdomen soft, obese, no peritoneal signs  Lab Results:   Recent Labs  05/13/12 0411  WBC 3.8*  HGB 7.5*  HCT 21.5*  PLT 120*   BMET  Recent Labs  05/12/12 2025  NA 134*  K 3.5  CL 98  CO2 28  GLUCOSE 128*  BUN 10  CREATININE 0.73  CALCIUM 9.0   PT/INR No results found for this basename: LABPROT, INR,  in the last 72 hours ABG No results found for this basename: PHART, PCO2, PO2, HCO3,  in the last 72 hours  Studies/Results: No results found.  Anti-infectives: Anti-infectives   Start     Dose/Rate Route Frequency Ordered Stop   05/12/12 0745  ciprofloxacin (CIPRO) IVPB 400 mg     400 mg 200 mL/hr over 60 Minutes Intravenous Every 12 hours 05/12/12 0733     05/08/12 0600  ciprofloxacin (CIPRO) IVPB 400 mg     400 mg 200 mL/hr over 60 Minutes Intravenous On call to O.R. 05/07/12 1353 05/08/12 1228      Assessment/Plan: s/p Procedure(s): Excision gastric mass, possible partial gastrectomy (N/A)  Will try miralax and maalox Repeat labs in the morning  Discussed path.  1.6cm low grade carcinoid tumor  LOS: 7 days    Solash Tullo A 05/15/2012

## 2012-05-16 DIAGNOSIS — E876 Hypokalemia: Secondary | ICD-10-CM

## 2012-05-16 LAB — CBC
HCT: 22.4 % — ABNORMAL LOW (ref 36.0–46.0)
Hemoglobin: 7.8 g/dL — ABNORMAL LOW (ref 12.0–15.0)
MCH: 32.2 pg (ref 26.0–34.0)
MCHC: 34.8 g/dL (ref 30.0–36.0)
MCV: 92.6 fL (ref 78.0–100.0)
Platelets: 191 10*3/uL (ref 150–400)
RBC: 2.42 MIL/uL — ABNORMAL LOW (ref 3.87–5.11)
RDW: 18.5 % — ABNORMAL HIGH (ref 11.5–15.5)
WBC: 5.3 10*3/uL (ref 4.0–10.5)

## 2012-05-16 LAB — TYPE AND SCREEN
Antibody Screen: NEGATIVE
Unit division: 0

## 2012-05-16 LAB — BASIC METABOLIC PANEL
BUN: 5 mg/dL — ABNORMAL LOW (ref 6–23)
CO2: 32 mEq/L (ref 19–32)
Chloride: 97 mEq/L (ref 96–112)
Creatinine, Ser: 0.73 mg/dL (ref 0.50–1.10)
Potassium: 2.5 mEq/L — CL (ref 3.5–5.1)

## 2012-05-16 LAB — MAGNESIUM: Magnesium: 2.3 mg/dL (ref 1.5–2.5)

## 2012-05-16 MED ORDER — POTASSIUM CHLORIDE CRYS ER 20 MEQ PO TBCR: 40.0000 meq | EXTENDED_RELEASE_TABLET | Freq: Once | ORAL | Status: DC

## 2012-05-16 MED ORDER — POTASSIUM CHLORIDE CRYS ER 20 MEQ PO TBCR
40.0000 meq | EXTENDED_RELEASE_TABLET | Freq: Two times a day (BID) | ORAL | Status: DC
  Administered 2012-05-16 (×2): 40 meq via ORAL
  Filled 2012-05-16 (×4): qty 2

## 2012-05-16 MED ORDER — OXYCODONE HCL 5 MG PO TABS: 5.0000 mg | ORAL_TABLET | ORAL | Status: DC | PRN

## 2012-05-16 MED ORDER — POTASSIUM CHLORIDE 10 MEQ/100ML IV SOLN
10.0000 meq | INTRAVENOUS | Status: AC
  Filled 2012-05-16: qty 400

## 2012-05-16 NOTE — Progress Notes (Signed)
8 Days Post-Op  Subjective: Looks and feels great today No SOB Having BM's Tolerating po  Objective: Vital signs in last 24 hours: Temp:  [98.2 F (36.8 C)-100.6 F (38.1 C)] 99.2 F (37.3 C) (04/03 0530) Pulse Rate:  [87-117] 92 (04/03 0530) Resp:  [18-20] 18 (04/03 0530) BP: (101-121)/(53-71) 101/59 mmHg (04/03 0530) SpO2:  [93 %-98 %] 96 % (04/03 0530) Last BM Date: 05/15/12  Intake/Output from previous day: 04/02 0701 - 04/03 0700 In: 1351.3 [P.O.:240; I.V.:711.3; IV Piggyback:400] Out: -  Intake/Output this shift:    Well in appearance Lungs clear Abdomen soft, incision clean  Lab Results:   Recent Labs  05/16/12 0618  WBC 5.3  HGB 7.8*  HCT 22.4*  PLT 191   BMET  Recent Labs  05/16/12 0618  NA 138  K 2.5*  CL 97  CO2 32  GLUCOSE 114*  BUN 5*  CREATININE 0.73  CALCIUM 8.7   PT/INR No results found for this basename: LABPROT, INR,  in the last 72 hours ABG No results found for this basename: PHART, PCO2, PO2, HCO3,  in the last 72 hours  Studies/Results: No results found.  Anti-infectives: Anti-infectives   Start     Dose/Rate Route Frequency Ordered Stop   05/12/12 0745  ciprofloxacin (CIPRO) IVPB 400 mg     400 mg 200 mL/hr over 60 Minutes Intravenous Every 12 hours 05/12/12 0733     05/08/12 0600  ciprofloxacin (CIPRO) IVPB 400 mg     400 mg 200 mL/hr over 60 Minutes Intravenous On call to O.R. 05/07/12 1353 05/08/12 1228      Assessment/Plan: s/p Procedure(s): Excision gastric mass, possible partial gastrectomy (N/A)  Replace k+ po for hypokalemia Discharge home at patient request  LOS: 8 days    Raven Harmes A 05/16/2012

## 2012-05-16 NOTE — Progress Notes (Signed)
Pt K 2.5, 4 runs of K ordered per Dr. Magnus Ivan. Attempted to infuse K at very slow rate with NS at same time, pt continued to refuse s/t burning at the site. Pt refuses any IV K, has taken po 40 meq of K. Patient aware that with level this low, she will not be discharged. Still refuses runs of K. Dr. Magnus Ivan aware. Hedy Camara

## 2012-05-16 NOTE — Discharge Summary (Signed)
Physician Discharge Summary  Patient ID: [name] MRN: [id number] DOB/AGE: [birth year] 56 y.o.  Admit date: 05/08/2012 Discharge date: 05/16/2012  Admission Diagnoses:  Discharge Diagnoses:  Principal Problem:   Gastric mass carcinoid tumor Morbid obesity Chronic anemia Post op atelectasis hypokalemia  Discharged Condition: good  Hospital Course: admitted for elective surgery.  Chronic anemia preop.  Required transfusions post op for chronic anemia.  NG removed POD#3.  Transferred to unit POD#4 for suspected transfusion reaction.  Back to floor next day.  Post op atelectasis.  Finally improved.  Discharged home POD#8 tolerating reg diet  Consults: None  Significant Diagnostic Studies:   Treatments: surgery: excision gastric mass  Discharge Exam: Blood pressure 101/59, pulse 92, temperature 99.2 F (37.3 C), temperature source Oral, resp. rate 18, height 5\' 2"  (1.575 m), weight 248 lb 10.9 oz (112.8 kg), SpO2 96.00%. General appearance: alert, cooperative and no distress Resp: clear to auscultation bilaterally GI: soft, non-tender; bowel sounds normal; no masses,  no organomegaly Incision/Wound:clean  Disposition: 01-Home or Self Care  Discharge Orders   Future Orders Complete By Expires     Discharge patient  As directed         Medication List    TAKE these medications       allopurinol 300 MG tablet  Commonly known as:  ZYLOPRIM  Take 300 mg by mouth daily.     azaTHIOprine 50 MG tablet  Commonly known as:  IMURAN  Take 50-100 mg by mouth 2 (two) times daily. 2 tablets in the morning and 1 tablet in the evening     cyclobenzaprine 10 MG tablet  Commonly known as:  FLEXERIL  Take 10 mg by mouth at bedtime.     esomeprazole 40 MG capsule  Commonly known as:  NEXIUM  Take 40 mg by mouth 2 (two) times daily.     HUMIRA 40 MG/0.8ML injection  Generic drug:  adalimumab  Inject 40 mg into the skin every Sunday.     oxyCODONE 5 MG immediate release  tablet  Commonly known as:  Oxy IR/ROXICODONE  Take 1-2 tablets (5-10 mg total) by mouth every 4 (four) hours as needed.     predniSONE 5 MG tablet  Commonly known as:  DELTASONE  Take 5 mg by mouth daily.     THERATEARS OP  Apply 1 drop to eye every morning.           Follow-up Information   Follow up with [facility], MD. Call in 5 days. (ask for [facility])    Contact information:   [address] [place] [address] [phone]       Signed: Shelly Rubenstein 05/16/2012, 8:56 AM

## 2012-05-16 NOTE — Significant Event (Signed)
CRITICAL VALUE ALERT  Critical value received:  K 2.5  Date of notification:  16 May 2012  Time of notification:  0830  Critical value read back:yes  Nurse who received alert:  Laural Roes RN   MD notified (1st page):  CCS  Time of first page:  [phone]  MD notified (2nd page):  Time of second page:  Responding MD:  Michaell Cowing CCS  Time MD responded:  [phone]

## 2012-05-17 LAB — BASIC METABOLIC PANEL
CO2: 32 mEq/L (ref 19–32)
Chloride: 95 mEq/L — ABNORMAL LOW (ref 96–112)
Creatinine, Ser: 0.83 mg/dL (ref 0.50–1.10)
GFR calc Af Amer: 90 mL/min — ABNORMAL LOW (ref >90)
Potassium: 3.2 mEq/L — ABNORMAL LOW (ref 3.5–5.1)

## 2012-05-17 MED ORDER — CIPROFLOXACIN HCL 500 MG PO TABS: 500.0000 mg | ORAL_TABLET | Freq: Two times a day (BID) | ORAL | Status: DC

## 2012-05-17 NOTE — Progress Notes (Signed)
Patient ID: [name], female   DOB: 10-03-1955, 57 y.o.   MRN: [id number] Doing well this morning  Discharge home

## 2012-05-17 NOTE — Progress Notes (Signed)
Patient discharged home with husband.  Follow up instructions, discharge instructions, and prescriptions given to patient who verbalized understanding.

## 2012-05-20 ENCOUNTER — Ambulatory Visit (INDEPENDENT_AMBULATORY_CARE_PROVIDER_SITE_OTHER): Payer: Commercial Managed Care - PPO | Admitting: Surgery

## 2012-05-20 ENCOUNTER — Other Ambulatory Visit (INDEPENDENT_AMBULATORY_CARE_PROVIDER_SITE_OTHER): Payer: Self-pay | Admitting: Surgery

## 2012-05-20 ENCOUNTER — Encounter (INDEPENDENT_AMBULATORY_CARE_PROVIDER_SITE_OTHER): Payer: Self-pay | Admitting: Surgery

## 2012-05-20 ENCOUNTER — Telehealth (INDEPENDENT_AMBULATORY_CARE_PROVIDER_SITE_OTHER): Payer: Self-pay | Admitting: *Deleted

## 2012-05-20 VITALS — BP 120/66 | HR 70 | Temp 97.7°F | Resp 14 | Ht 62.0 in | Wt 241.0 lb

## 2012-05-20 DIAGNOSIS — IMO0001 Reserved for inherently not codable concepts without codable children: Secondary | ICD-10-CM

## 2012-05-20 DIAGNOSIS — D638 Anemia in other chronic diseases classified elsewhere: Secondary | ICD-10-CM

## 2012-05-20 DIAGNOSIS — Z09 Encounter for follow-up examination after completed treatment for conditions other than malignant neoplasm: Secondary | ICD-10-CM

## 2012-05-20 NOTE — Progress Notes (Signed)
Subjective:     Patient ID: [name], female   DOB: 1955-07-03, 57 y.o.   MRN: [id number]  HPI She is here for her first postoperative visit status post excision of a gastric  Carcinoid tumor.  She continued to have anemia postoperatively and needed transfusions.  She started having some drainage from her incision yesterday. She denies fevers. She has had no nausea or vomiting  Review of Systems     Objective:   Physical Exam On exam, there is a small area of erythema at the upper portion of the incision. I removed all staples. Steri-Strips were placed. I opened a small area and drain some seroma fluid. There was no odor.    Assessment:     Patient stable postop     Plan:     I will check her hemoglobin in 2 weeks to see [place] she is progressing. I will also see her back at that time. She will continue to refrain from heavy lifting.  We will arrange home health to do daily wet to dry dressing changes.

## 2012-05-20 NOTE — Telephone Encounter (Signed)
Spoke to patient she states there is still redness around the incision site but the drainage is improving.  Patient has appt to see Magnus Ivan MD tomorrow at 12p.  Patient is still taking her Cipro at this time.  Patient does state she is feeling better than a few days ago.

## 2012-05-21 ENCOUNTER — Encounter (INDEPENDENT_AMBULATORY_CARE_PROVIDER_SITE_OTHER): Payer: Self-pay | Admitting: General Surgery

## 2012-05-21 ENCOUNTER — Ambulatory Visit (INDEPENDENT_AMBULATORY_CARE_PROVIDER_SITE_OTHER): Payer: Commercial Managed Care - PPO | Admitting: General Surgery

## 2012-05-21 ENCOUNTER — Encounter (INDEPENDENT_AMBULATORY_CARE_PROVIDER_SITE_OTHER): Payer: Commercial Managed Care - PPO | Admitting: Surgery

## 2012-05-21 ENCOUNTER — Telehealth (INDEPENDENT_AMBULATORY_CARE_PROVIDER_SITE_OTHER): Payer: Self-pay | Admitting: General Surgery

## 2012-05-21 VITALS — BP 128/69 | HR 66 | Temp 97.6°F | Resp 14 | Ht 62.0 in | Wt 232.6 lb

## 2012-05-21 DIAGNOSIS — IMO0001 Reserved for inherently not codable concepts without codable children: Secondary | ICD-10-CM

## 2012-05-21 DIAGNOSIS — Z4800 Encounter for change or removal of nonsurgical wound dressing: Secondary | ICD-10-CM

## 2012-05-21 NOTE — Telephone Encounter (Signed)
Spoke with Dr Magnus Ivan he advised to ask patient to come in for a dressing change Nurse only visit . We are still waiting on approval for insurance for [facility] visits . I also told patient's husband that Mrs Vaquerano can take a shower per Dr Magnus Ivan

## 2012-05-21 NOTE — Patient Instructions (Signed)
Patient came in for a nurse only for wound dressing changes and wound check. I placed a wet 2x2 gauge in the wound and covered it with a dry gauge on top and also placed a ABD pad on top to help with the drainage. The patient asked if she could take a bath and I told her to take a shower and let the water run over the wound and pat it dry when she go out and do the wet to dry dressing changes. I showed the husband who was in the room how to do the dressing changes. I told [name] if she starts running a fever and the wound is draining yellow/green stuff out the wound or the surgery area is red to call the office and we will get her to see on of the doctors if Dr Magnus Ivan is not available. I also reminded the patient to keep her upcoming apt with Dr Magnus Ivan for wound check

## 2012-05-30 ENCOUNTER — Other Ambulatory Visit (INDEPENDENT_AMBULATORY_CARE_PROVIDER_SITE_OTHER): Payer: Self-pay | Admitting: General Surgery

## 2012-05-30 ENCOUNTER — Ambulatory Visit (INDEPENDENT_AMBULATORY_CARE_PROVIDER_SITE_OTHER): Payer: Commercial Managed Care - PPO | Admitting: General Surgery

## 2012-05-30 ENCOUNTER — Encounter (INDEPENDENT_AMBULATORY_CARE_PROVIDER_SITE_OTHER): Payer: Self-pay | Admitting: General Surgery

## 2012-05-30 VITALS — BP 126/80 | HR 84 | Temp 97.9°F | Resp 16 | Ht 62.0 in | Wt 226.8 lb

## 2012-05-30 DIAGNOSIS — Z5189 Encounter for other specified aftercare: Secondary | ICD-10-CM

## 2012-05-30 DIAGNOSIS — IMO0001 Reserved for inherently not codable concepts without codable children: Secondary | ICD-10-CM

## 2012-05-30 DIAGNOSIS — T8149XA Infection following a procedure, other surgical site, initial encounter: Secondary | ICD-10-CM | POA: Insufficient documentation

## 2012-05-30 NOTE — Progress Notes (Signed)
Subjective:     Patient ID: [name], female   DOB: [birth year], 57 y.o.   MRN: [id number]  HPI  She underwent open excision of a gastric mass 05/08/2012. Pathology demonstrated carcinoid. She is noted redness at the inferior aspect of the incision.   Review of Systems     Objective:   Physical Exam Abdomen-soft, there is a small open area superiorly that is clean with granulation tissue. Her husband has been changing his dressing. Inferiorly it is an area of erythema and warmth. This area was anesthetized after being cleaned. Part of the incision was opened up and a large amount (approximately 200 cc) of purulent fluid was drained. I opened up more the skin over this area. The wound was then packed with saline moistened gauze. The upper wound is also packed with saline moistened gauze. A bulky dry dressing was applied.    Assessment:     Deep wound infection.     Plan:     Saline damp to dry dressing changes to open portions of the wound. Supplemental vitamins to try to counteract the prednisone effect of wound healing. Keep appointment with Dr. Magnus Ivan in one week.

## 2012-05-30 NOTE — Patient Instructions (Signed)
Nurses will help with saline damp to dry dressing changes daily.  Take supplemental Vitamin A, Vitamin C, and Zinc.

## 2012-06-03 ENCOUNTER — Encounter (INDEPENDENT_AMBULATORY_CARE_PROVIDER_SITE_OTHER): Payer: Self-pay | Admitting: *Deleted

## 2012-06-03 ENCOUNTER — Telehealth (INDEPENDENT_AMBULATORY_CARE_PROVIDER_SITE_OTHER): Payer: Self-pay | Admitting: *Deleted

## 2012-06-03 NOTE — Telephone Encounter (Signed)
[name] who is a home health nursing with [facility] called concerned about possible infection to patients wound.  She states she believe site needs to be assessed prior to patients appt on 4/25 with Dr. Magnus Ivan.  Appt made for urgent office for tomorrow.

## 2012-06-04 ENCOUNTER — Ambulatory Visit (INDEPENDENT_AMBULATORY_CARE_PROVIDER_SITE_OTHER): Payer: Commercial Managed Care - PPO | Admitting: General Surgery

## 2012-06-04 ENCOUNTER — Other Ambulatory Visit (INDEPENDENT_AMBULATORY_CARE_PROVIDER_SITE_OTHER): Payer: Self-pay | Admitting: General Surgery

## 2012-06-04 ENCOUNTER — Encounter (INDEPENDENT_AMBULATORY_CARE_PROVIDER_SITE_OTHER): Payer: Self-pay | Admitting: General Surgery

## 2012-06-04 VITALS — BP 140/70 | HR 108 | Temp 98.3°F | Resp 20 | Ht 62.0 in | Wt 226.0 lb

## 2012-06-04 DIAGNOSIS — Z09 Encounter for follow-up examination after completed treatment for conditions other than malignant neoplasm: Secondary | ICD-10-CM

## 2012-06-04 DIAGNOSIS — L089 Local infection of the skin and subcutaneous tissue, unspecified: Secondary | ICD-10-CM

## 2012-06-04 NOTE — Progress Notes (Signed)
Subjective:     Patient ID: [name], female   DOB: 06-14-1955, 57 y.o.   MRN: [id number]  HPI 57 year old Caucasian female comes in for wound check. She underwent excision of a gastric mass on March 26 by Dr. Magnus Ivan. Apparently part of her incision opened up and started draining fluid. She was seen the other day in urgent office by Dr. Abbey Chatters. The home health nurse called today stating there was increased drainage from the wound and that they wanted it checked out. Currently the wound is just being checked for change once a day. She denies any fevers or chills. She reports good bowel movements. She reports a decent appetite.  Review of Systems     Objective:   Physical Exam BP 140/70  Pulse 108  Temp(Src) 98.3 F (36.8 C) (Temporal)  Resp 20  Ht 5\' 2"  (1.575 m)  Wt 226 lb (102.513 kg)  BMI 41.33 kg/m2  SpO2 98% Alert, no apparent distress Abdomen-obese, soft, nontender. Upper midline incision with 2 open areas. The more superior opening is about 2-1/2 cm long by 1 cm wide. There is good granulation tissue at the base. The lower part of the incision that is open has purulent drainage coming out of it. The fascia appears intact. In the fascia appears have some good granulation tissue.    Assessment:     Postoperative wound infection     Plan:     Apparently home health nursing in today were going to try to get her in may. I explained the wound care to the husband. We're going to try to get home health care reestablished. I encouraged him to change the wound at least once a day, preferably twice a day. I told the patient she can take a shower prior to her dressing change. She already has an appointment to see Dr. Magnus Ivan later this week  [name]. Andrey Campanile, MD, FACS General, Bariatric, & Minimally Invasive Surgery [facility], [place]

## 2012-06-05 ENCOUNTER — Inpatient Hospital Stay: Admit: 2012-06-05 | Payer: Self-pay | Admitting: Surgery

## 2012-06-05 LAB — CBC
HCT: 23 % — ABNORMAL LOW (ref 36.0–46.0)
Hemoglobin: 7.6 g/dL — ABNORMAL LOW (ref 12.0–15.0)
MCV: 96.6 fL (ref 78.0–100.0)
RDW: 21.2 % — ABNORMAL HIGH (ref 11.5–15.5)
WBC: 6 10*3/uL (ref 4.0–10.5)

## 2012-06-05 SURGERY — LAPAROSCOPIC PARTIAL GASTRECTOMY
Anesthesia: General

## 2012-06-06 ENCOUNTER — Telehealth (INDEPENDENT_AMBULATORY_CARE_PROVIDER_SITE_OTHER): Payer: Self-pay | Admitting: General Surgery

## 2012-06-06 NOTE — Telephone Encounter (Signed)
Pt's husband called for results of labs drawn yesterday.  No results in computer since 05/20/12.  (Also did not see any ordered subsequently to that.)  Husband stated they went to [facility] lab yesterday.

## 2012-06-07 ENCOUNTER — Encounter (INDEPENDENT_AMBULATORY_CARE_PROVIDER_SITE_OTHER): Payer: Self-pay | Admitting: Surgery

## 2012-06-07 ENCOUNTER — Other Ambulatory Visit (INDEPENDENT_AMBULATORY_CARE_PROVIDER_SITE_OTHER): Payer: Self-pay | Admitting: Surgery

## 2012-06-07 ENCOUNTER — Ambulatory Visit (INDEPENDENT_AMBULATORY_CARE_PROVIDER_SITE_OTHER): Payer: Commercial Managed Care - PPO | Admitting: Surgery

## 2012-06-07 VITALS — BP 140/86 | HR 76 | Temp 97.8°F | Resp 16 | Ht 62.0 in | Wt 224.5 lb

## 2012-06-07 DIAGNOSIS — T8189XA Other complications of procedures, not elsewhere classified, initial encounter: Secondary | ICD-10-CM

## 2012-06-07 DIAGNOSIS — Z09 Encounter for follow-up examination after completed treatment for conditions other than malignant neoplasm: Secondary | ICD-10-CM

## 2012-06-07 NOTE — Progress Notes (Signed)
Subjective:     Patient ID: [name], female   DOB: 20-Feb-1955, 57 y.o.   MRN: [id number]  HPI She is here for another postop check. She had developed a wound infection and now has an open wound and this undergoing twice a day wet-to-dry dressing changes. Otherwise she is without complaints. She had labs drawn 2 days ago. She is moving her bowels well and has had no weakness or dizziness.  Review of Systems     Objective:   Physical Exam On exam, her wounds are open with excellent granulation tissue and no purulence    Her hemoglobin is 7.6 and hematocrit is 23.0 platelet count is 88,000.  Her hemoglobin is stable over the last 3 weeks. Assessment:     Postop wound infection     Plan:     We will now contact home health and try to have a wound VAC placed. I do not believe she'll be ready to return to work until May 26. I will see her back in 3 weeks

## 2012-06-10 ENCOUNTER — Telehealth: Payer: Self-pay | Admitting: Internal Medicine

## 2012-06-10 NOTE — Telephone Encounter (Signed)
pt called back to r/s cancelled appt....done

## 2012-06-13 ENCOUNTER — Telehealth: Payer: Self-pay | Admitting: Medical Oncology

## 2012-06-13 ENCOUNTER — Other Ambulatory Visit (INDEPENDENT_AMBULATORY_CARE_PROVIDER_SITE_OTHER): Payer: Self-pay | Admitting: Surgery

## 2012-06-13 DIAGNOSIS — T8189XA Other complications of procedures, not elsewhere classified, initial encounter: Secondary | ICD-10-CM

## 2012-06-13 NOTE — Telephone Encounter (Signed)
Lab appt confirmed

## 2012-06-18 ENCOUNTER — Other Ambulatory Visit (HOSPITAL_BASED_OUTPATIENT_CLINIC_OR_DEPARTMENT_OTHER): Payer: Commercial Managed Care - PPO | Admitting: Lab

## 2012-06-18 ENCOUNTER — Telehealth: Payer: Self-pay | Admitting: Internal Medicine

## 2012-06-18 ENCOUNTER — Ambulatory Visit (HOSPITAL_BASED_OUTPATIENT_CLINIC_OR_DEPARTMENT_OTHER): Payer: Commercial Managed Care - PPO | Admitting: Internal Medicine

## 2012-06-18 ENCOUNTER — Encounter: Payer: Self-pay | Admitting: Internal Medicine

## 2012-06-18 VITALS — BP 155/89 | HR 84 | Temp 98.7°F | Resp 18 | Ht 61.0 in | Wt 227.1 lb

## 2012-06-18 DIAGNOSIS — D638 Anemia in other chronic diseases classified elsewhere: Secondary | ICD-10-CM

## 2012-06-18 DIAGNOSIS — D539 Nutritional anemia, unspecified: Secondary | ICD-10-CM

## 2012-06-18 DIAGNOSIS — D649 Anemia, unspecified: Secondary | ICD-10-CM

## 2012-06-18 LAB — CBC & DIFF AND RETIC
Basophils Absolute: 0 10*3/uL (ref 0.0–0.1)
EOS%: 3.6 % (ref 0.0–7.0)
Eosinophils Absolute: 0.2 10*3/uL (ref 0.0–0.5)
HGB: 8.1 g/dL — ABNORMAL LOW (ref 11.6–15.9)
Immature Retic Fract: 9.5 % (ref 1.60–10.00)
NEUT#: 1.6 10*3/uL (ref 1.5–6.5)
RBC: 2.42 10*6/uL — ABNORMAL LOW (ref 3.70–5.45)
RDW: 21.6 % — ABNORMAL HIGH (ref 11.2–14.5)
Retic %: 2.36 % — ABNORMAL HIGH (ref 0.70–2.10)
Retic Ct Abs: 57.11 10*3/uL (ref 33.70–90.70)
lymph#: 3.8 10*3/uL — ABNORMAL HIGH (ref 0.9–3.3)

## 2012-06-18 NOTE — Patient Instructions (Signed)
You have persistent anemia most likely anemia of chronic disease. I would consider her for treatment with Aranesp every 3 weeks. Followup visit in 6 weeks

## 2012-06-18 NOTE — Progress Notes (Signed)
[name] Telephone:([phone]) [phone]   Fax:([phone]  OFFICE PROGRESS NOTE  [name], MD [address] [place] [address]  DIAGNOSIS: Persistent anemia questionable for anemia of chronic disease  PRIOR THERAPY: None  CURRENT THERAPY: The patient will start treatment with Aranesp 300 mcg subcutaneously every 3 weeks  INTERVAL HISTORY: [name] 57 y.o. female returns to the clinic today for followup visit accompanied by her husband. The patient underwent partial gastrectomy for the questionable gastric mass under the care of Dr. Magnus Ivan in 05/09/2012. The final pathology showed carcinoid tumor measuring 1.6 CM with negative resection margin. During her hospitalization she received blood transfusion which was complicated by a transfusion reaction and the patient has to be transferred to the ICU. He was discharged home 8 days after her surgical procedure. She continues to have persistent anemia with mild fatigue. Her previous bloodwork on 04/23/2012 showed elevated level of 270 with iron saturation of 88% and ferritin level 1249. The patient is here today for evaluation and discussion of her treatment options. She has a history of rheumatoid arthritis as well as autoimmune hepatitis she has been on treatment with Imuran and Humira but these are currently on hold.  She denied having any other significant complaints today except for shortness breath with exertion. She has no dizzy spells.  MEDICAL HISTORY: Past Medical History  Diagnosis Date  . Hypertension   . Gout   . GERD (gastroesophageal reflux disease)   . History of kidney cancer   . Autoimmune hepatitis   . Rheumatoid arthritis   . Anemia   . PONV (postoperative nausea and vomiting)   . Shortness of breath     exertion  . Cancer 2006    renal cancer    ALLERGIES:  is allergic to aspirin; lisinopril; methotrexate derivatives; morphine and related; other; penicillins; aloe;  lactose intolerance (gi); latex; and prozac.  MEDICATIONS:  Current Outpatient Prescriptions  Medication Sig Dispense Refill  . allopurinol (ZYLOPRIM) 300 MG tablet Take 300 mg by mouth daily.      Marland Kitchen ALPRAZolam (XANAX) 0.5 MG tablet Take 0.5 mg by mouth at bedtime as needed for sleep.      . Ascorbic Acid (VITAMIN C PO) Take by mouth.      . Carboxymethylcellulose Sodium (THERATEARS OP) Apply 1 drop to eye every morning.      . cyclobenzaprine (FLEXERIL) 10 MG tablet Take 10 mg by mouth at bedtime.       Marland Kitchen esomeprazole (NEXIUM) 40 MG capsule Take 40 mg by mouth 2 (two) times daily.      . Multiple Vitamins-Minerals (ZINC PO) Take by mouth.      . oxyCODONE-acetaminophen (PERCOCET/ROXICET) 5-325 MG per tablet Take 1 tablet by mouth every 4 (four) hours as needed for pain (1-2 tablets q4 hrs prn).      Marland Kitchen VITAMIN A PO Take by mouth.       No current facility-administered medications for this visit.    SURGICAL HISTORY:  Past Surgical History  Procedure Laterality Date  . Abdominal hysterectomy    . Cholecystectomy, laparoscopic    . Partial nephrectomy    . Eus N/A 04/17/2012    Procedure: ESOPHAGEAL ENDOSCOPIC ULTRASOUND (EUS) RADIAL;  Surgeon: Willis Modena, MD;  Location: [facility];  Service: Endoscopy;  Laterality: N/A;  . Tonsillectomy and adenoidectomy    . Breast biopsy      "several"  . Cholecystectomy    .  Wisdom tooth extraction    . Finger surgery      mass removed  . Gastrectomy N/A 05/08/2012    Procedure: Excision gastric mass, possible partial gastrectomy;  Surgeon: Shelly Rubenstein, MD;  Location: [facility] OR;  Service: General;  Laterality: N/A;    REVIEW OF SYSTEMS:  A comprehensive review of systems was negative except for: Constitutional: positive for fatigue Respiratory: positive for dyspnea on exertion   PHYSICAL EXAMINATION: General appearance: alert, cooperative, fatigued and no distress Head: Normocephalic, without obvious abnormality, atraumatic Neck: no  adenopathy Lymph nodes: Cervical, supraclavicular, and axillary nodes normal. Resp: clear to auscultation bilaterally Cardio: regular rate and rhythm, S1, S2 normal, no murmur, click, rub or gallop GI: soft, non-tender; bowel sounds normal; no masses,  no organomegaly Extremities: extremities normal, atraumatic, no cyanosis or edema Neurologic: Alert and oriented X 3, normal strength and tone. Normal symmetric reflexes. Normal coordination and gait  ECOG PERFORMANCE STATUS: 1 - Symptomatic but completely ambulatory  Blood pressure 155/89, pulse 84, temperature 98.7 F (37.1 C), temperature source Oral, resp. rate 18, height 5\' 1"  (1.549 m), weight 227 lb 1.6 oz (103.012 kg).  LABORATORY DATA: Lab Results  Component Value Date   WBC 6.5 06/18/2012   HGB 8.1* 06/18/2012   HCT 24.5* 06/18/2012   MCV 101.2* 06/18/2012   PLT 368 06/18/2012      Chemistry      Component Value Date/Time   NA 134* 05/17/2012 0535   NA 141 04/23/2012 1310   K 3.2* 05/17/2012 0535   K 3.8 04/23/2012 1310   CL 95* 05/17/2012 0535   CL 106 04/23/2012 1310   CO2 32 05/17/2012 0535   CO2 26 04/23/2012 1310   BUN 5* 05/17/2012 0535   BUN 12.7 04/23/2012 1310   CREATININE 0.83 05/17/2012 0535   CREATININE 0.9 04/23/2012 1310      Component Value Date/Time   CALCIUM 8.7 05/17/2012 0535   CALCIUM 9.4 04/23/2012 1310   ALKPHOS 164* 05/08/2012 1000   ALKPHOS 135 04/23/2012 1310   AST 37 05/08/2012 1000   AST 36* 04/23/2012 1310   ALT 43* 05/08/2012 1000   ALT 35 04/23/2012 1310   BILITOT 0.6 05/08/2012 1000   BILITOT 0.69 04/23/2012 1310       RADIOGRAPHIC STUDIES: No results found.  ASSESSMENT: This is a very pleasant 57 years old white female with: 1) persistent anemia most likely anemia of chronic disease secondary to multiple medical problems including history of kidney cancer, autoimmune hepatitis and rheumatoid arthritis. 2) gastric carcinoid tumor.   PLAN: I have a lengthy discussion with the patient and her husband today  about her condition. 1)  I recommended for the patient to consider a bone marrow biopsy and aspirate to rule out any other etiology for her anemia but unfortunately the patient cannot lay on her daily for the procedure because of wound drainage issue secondary to her recent surgery. I also recommended for the patient treatment with Aranesp 300 mcg subcutaneously every 3 weeks. I discussed with the patient adverse effect of this treatment including high risk for cardiac and cerebrovascular disease. The patient would like to proceed with the treatment as planned. 2) For the gastric carcinoid tumor, the patient will continue on observation for now. No need for systemic chemotherapy. She would come back for followup visit in 6 weeks for reevaluation and management any adverse effect of her treatment. She was advised to call immediately if she has any concerning symptoms in the interval  All questions were answered. The patient knows to call the clinic with any problems, questions or concerns. We can certainly see the patient much sooner if necessary.  I spent 15 minutes counseling the patient face to face. The total time spent in the appointment was 25 minutes.

## 2012-06-19 ENCOUNTER — Encounter (INDEPENDENT_AMBULATORY_CARE_PROVIDER_SITE_OTHER): Payer: Self-pay

## 2012-06-19 ENCOUNTER — Telehealth (INDEPENDENT_AMBULATORY_CARE_PROVIDER_SITE_OTHER): Payer: Self-pay | Admitting: General Surgery

## 2012-06-19 NOTE — Telephone Encounter (Signed)
It's fine to d/c the [facility]

## 2012-06-19 NOTE — Telephone Encounter (Signed)
Called and spoke with [name] and advised per Dr Magnus Ivan that wound vac can be discharged. Advised to due wet to dry dressing changes daily. She states she can teach husband to do wet to dry dressing changes and come out once a week to check wound. They will let us know if any problems.

## 2012-06-19 NOTE — Telephone Encounter (Signed)
[name] with [facility] called to see if we can discharge wound vac. She is having no drainage in her canister and wound is now measuring 1.5 cm x 1 cm x 2 cm. Please advise. [name] can be reached at [phone] or [phone].

## 2012-06-20 LAB — SPEP & IFE WITH QIG
Albumin ELP: 41.8 % — ABNORMAL LOW (ref 55.8–66.1)
IgA: 294 mg/dL (ref 69–380)
IgM, Serum: 90 mg/dL (ref 52–322)
Total Protein, Serum Electrophoresis: 8.4 g/dL — ABNORMAL HIGH (ref 6.0–8.3)

## 2012-06-20 LAB — IRON AND TIBC
Iron: 167 ug/dL — ABNORMAL HIGH (ref 42–145)
UIBC: 102 ug/dL — ABNORMAL LOW (ref 125–400)

## 2012-06-24 ENCOUNTER — Telehealth (INDEPENDENT_AMBULATORY_CARE_PROVIDER_SITE_OTHER): Payer: Self-pay

## 2012-06-24 ENCOUNTER — Other Ambulatory Visit: Payer: Self-pay | Admitting: Medical Oncology

## 2012-06-24 ENCOUNTER — Other Ambulatory Visit (HOSPITAL_BASED_OUTPATIENT_CLINIC_OR_DEPARTMENT_OTHER): Payer: Commercial Managed Care - PPO | Admitting: Lab

## 2012-06-24 ENCOUNTER — Ambulatory Visit (HOSPITAL_BASED_OUTPATIENT_CLINIC_OR_DEPARTMENT_OTHER): Payer: Commercial Managed Care - PPO

## 2012-06-24 VITALS — BP 149/87 | HR 88 | Temp 98.4°F

## 2012-06-24 DIAGNOSIS — D649 Anemia, unspecified: Secondary | ICD-10-CM

## 2012-06-24 DIAGNOSIS — D638 Anemia in other chronic diseases classified elsewhere: Secondary | ICD-10-CM

## 2012-06-24 LAB — CBC WITH DIFFERENTIAL/PLATELET
Basophils Absolute: 0 10*3/uL (ref 0.0–0.1)
EOS%: 0.8 % (ref 0.0–7.0)
Eosinophils Absolute: 0.1 10*3/uL (ref 0.0–0.5)
HCT: 23.2 % — ABNORMAL LOW (ref 34.8–46.6)
HGB: 8 g/dL — ABNORMAL LOW (ref 11.6–15.9)
LYMPH%: 39 % (ref 14.0–49.7)
MCH: 34.9 pg — ABNORMAL HIGH (ref 25.1–34.0)
MCV: 100.8 fL (ref 79.5–101.0)
MONO%: 11.2 % (ref 0.0–14.0)
NEUT#: 4 10*3/uL (ref 1.5–6.5)
NEUT%: 48.6 % (ref 38.4–76.8)
Platelets: 371 10*3/uL (ref 145–400)

## 2012-06-24 MED ORDER — DARBEPOETIN ALFA-POLYSORBATE 300 MCG/0.6ML IJ SOLN
300.0000 ug | Freq: Once | INTRAMUSCULAR | Status: AC
  Administered 2012-06-24: 300 ug via SUBCUTANEOUS
  Filled 2012-06-24: qty 0.6

## 2012-06-24 NOTE — Patient Instructions (Signed)
Darbepoetin Alfa injection What is this medicine? DARBEPOETIN ALFA (dar be POE e tin AL fa) helps your body make more red blood cells. It is used to treat anemia caused by chronic kidney failure and chemotherapy. This medicine may be used for other purposes; ask your health care provider or pharmacist if you have questions. What should I tell my health care provider before I take this medicine? They need to know if you have any of these conditions: -blood clotting disorders or history of blood clots -cancer patient not on chemotherapy -cystic fibrosis -heart disease, such as angina, heart failure, or a history of a heart attack -hemoglobin level of 12 g/dL or greater -high blood pressure -low levels of folate, iron, or vitamin B12 -seizures -an unusual or allergic reaction to darbepoetin, erythropoietin, albumin, hamster proteins, latex, other medicines, foods, dyes, or preservatives -pregnant or trying to get pregnant -breast-feeding How should I use this medicine? This medicine is for injection into a vein or under the skin. It is usually given by a health care professional in a hospital or clinic setting. If you get this medicine at home, you will be taught how to prepare and give this medicine. Do not shake the solution before you withdraw a dose. Use exactly as directed. Take your medicine at regular intervals. Do not take your medicine more often than directed. It is important that you put your used needles and syringes in a special sharps container. Do not put them in a trash can. If you do not have a sharps container, call your pharmacist or healthcare provider to get one. Talk to your pediatrician regarding the use of this medicine in children. While this medicine may be used in children as young as 1 year for selected conditions, precautions do apply. Overdosage: If you think you have taken too much of this medicine contact a poison control center or emergency room at once. NOTE:  This medicine is only for you. Do not share this medicine with others. What if I miss a dose? If you miss a dose, take it as soon as you can. If it is almost time for your next dose, take only that dose. Do not take double or extra doses. What may interact with this medicine? Do not take this medicine with any of the following medications: -epoetin alfa This list may not describe all possible interactions. Give your health care provider a list of all the medicines, herbs, non-prescription drugs, or dietary supplements you use. Also tell them if you smoke, drink alcohol, or use illegal drugs. Some items may interact with your medicine. What should I watch for while using this medicine? Visit your prescriber or health care professional for regular checks on your progress and for the needed blood tests and blood pressure measurements. It is especially important for the doctor to make sure your hemoglobin level is in the desired range, to limit the risk of potential side effects and to give you the best benefit. Keep all appointments for any recommended tests. Check your blood pressure as directed. Ask your doctor what your blood pressure should be and when you should contact him or her. As your body makes more red blood cells, you may need to take iron, folic acid, or vitamin B supplements. Ask your doctor or health care provider which products are right for you. If you have kidney disease continue dietary restrictions, even though this medication can make you feel better. Talk with your doctor or health care professional about the   foods you eat and the vitamins that you take. What side effects may I notice from receiving this medicine? Side effects that you should report to your doctor or health care professional as soon as possible: -allergic reactions like skin rash, itching or hives, swelling of the face, lips, or tongue -breathing problems -changes in vision -chest pain -confusion, trouble speaking  or understanding -feeling faint or lightheaded, falls -high blood pressure -muscle aches or pains -pain, swelling, warmth in the leg -rapid weight gain -severe headaches -sudden numbness or weakness of the face, arm or leg -trouble walking, dizziness, loss of balance or coordination -seizures (convulsions) -swelling of the ankles, feet, hands -unusually weak or tired Side effects that usually do not require medical attention (report to your doctor or health care professional if they continue or are bothersome): -diarrhea -fever, chills (flu-like symptoms) -headaches -nausea, vomiting -redness, stinging, or swelling at site where injected This list may not describe all possible side effects. Call your doctor for medical advice about side effects. You may report side effects to FDA at 1-800-FDA-1088. Where should I keep my medicine? Keep out of the reach of children. Store in a refrigerator between 2 and 8 degrees C (36 and 46 degrees F). Do not freeze. Do not shake. Throw away any unused portion if using a single-dose vial. Throw away any unused medicine after the expiration date. NOTE: This sheet is a summary. It may not cover all possible information. If you have questions about this medicine, talk to your doctor, pharmacist, or health care provider.  2013, [employer]. (01/14/2008 10:23:57 AM)  

## 2012-06-24 NOTE — Telephone Encounter (Signed)
[name] called from [facility].  The wound vac was denied for the pt by the insurance.  Even though she is off the vac, they need Dr Magnus Ivan to do a peer to peer for the time it was on her.  The denial reference # is [id number]. Call [phone] ext [phone].  She said you may have to call and set it up for Dr Magnus Ivan.  Please call [name] back and let her know if approved or denied.  [phone] ext [phone]

## 2012-06-24 NOTE — Progress Notes (Signed)
Pt concerned about latex derivative in syringe on injection mentioned on Aranesp pt information sheet.  Pt states she does give herself injections at home which have the same thing (discussed with Gearldine Bienenstock, pharmacist).  Pt states she usually tolerates her home injections ok, rarely gets reaction, which her latex reaction is local blistering.  Discussed with Dr. Arbutus Ped as well, ok to proceed with Aranesp injection.  Informed pt of the above, and to monitor site at home, and call if any problems.  She verbalizes understanding.

## 2012-06-25 ENCOUNTER — Encounter (INDEPENDENT_AMBULATORY_CARE_PROVIDER_SITE_OTHER): Payer: Self-pay | Admitting: Surgery

## 2012-06-25 ENCOUNTER — Encounter (INDEPENDENT_AMBULATORY_CARE_PROVIDER_SITE_OTHER): Payer: Self-pay

## 2012-06-25 ENCOUNTER — Ambulatory Visit (INDEPENDENT_AMBULATORY_CARE_PROVIDER_SITE_OTHER): Payer: Commercial Managed Care - PPO | Admitting: Surgery

## 2012-06-25 VITALS — BP 132/84 | HR 78 | Temp 97.8°F | Resp 16 | Ht 62.0 in | Wt 227.6 lb

## 2012-06-25 DIAGNOSIS — Z09 Encounter for follow-up examination after completed treatment for conditions other than malignant neoplasm: Secondary | ICD-10-CM

## 2012-06-25 NOTE — Progress Notes (Signed)
Subjective:     Patient ID: [name], female   DOB: 05-Apr-1955, 57 y.o.   MRN: [id number]  HPI She is here for another postop visit. She is still slightly symptomatic from the anemia which is being followed up by the hematologist. 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 The wound VAC has been stopped and she is just doing wet-to-dry dressing changes. She is tolerating a diet  Review of Systems     Objective:   Physical Exam On exam, she has 2 open areas on the midline wound with excellent granulation tissue. Both are about a centimeter deep. One is still approximately a centimeter wide the other is 5 mm wide    Assessment:     Patient stable postop     Plan:     Because of the ongoing workup of her anemia and symptoms from this as well the open wound, all be moving her return to work date as June 2. At that time she may return to full Activity. She will continue the current wound care and I will see her back in 3 weeks

## 2012-06-27 ENCOUNTER — Ambulatory Visit (INDEPENDENT_AMBULATORY_CARE_PROVIDER_SITE_OTHER): Payer: Commercial Managed Care - PPO | Admitting: Surgery

## 2012-06-27 ENCOUNTER — Telehealth (INDEPENDENT_AMBULATORY_CARE_PROVIDER_SITE_OTHER): Payer: Self-pay | Admitting: *Deleted

## 2012-06-27 VITALS — BP 158/84 | HR 94 | Temp 98.5°F | Resp 20 | Ht 62.0 in | Wt 225.0 lb

## 2012-06-27 DIAGNOSIS — Z09 Encounter for follow-up examination after completed treatment for conditions other than malignant neoplasm: Secondary | ICD-10-CM

## 2012-06-27 NOTE — Telephone Encounter (Signed)
Patient's husband called to state that when he went to change dressing he noticed a green pus drainage coming from wound.  Husband also states that he believes the wound is deeper than it was on Monday.  Magnus Ivan MD paged at this time, awaiting call back at this time.

## 2012-06-27 NOTE — Progress Notes (Signed)
Subjective:     Patient ID: [name], female   DOB: 24-Oct-1955, 57 y.o.   MRN: [id number]  HPI She came back today to evaluate the upper wound as she fell it is opened up slightly further  Review of Systems     Objective:   Physical Exam On exam, both wounds were very clean.  I repacked The wounds with guaze     Assessment:     Patient stable postop     Plan:     Today we'll continue the current wound care. I reassured them that the wound is healing okay. They will keep her next appointment with me that is already scheduled

## 2012-06-27 NOTE — Telephone Encounter (Signed)
Spoke to [place] MD who states to bring patient in at 230p today so he can assess wound.  Patient agreeable at this time.

## 2012-07-05 ENCOUNTER — Telehealth: Payer: Self-pay | Admitting: Medical Oncology

## 2012-07-05 ENCOUNTER — Telehealth (INDEPENDENT_AMBULATORY_CARE_PROVIDER_SITE_OTHER): Payer: Self-pay | Admitting: General Surgery

## 2012-07-05 NOTE — Telephone Encounter (Signed)
Arthritic pain. Asking if Dr Arbutus Ped recommends any other med for arthritis since he took her off arthritis med .I left message suggesting that pt call her PCP.

## 2012-07-05 NOTE — Telephone Encounter (Signed)
If she can't take hydrocodone, she will have to come here and get a script which I can write.  I'll put [name] on it

## 2012-07-05 NOTE — Telephone Encounter (Signed)
Called patient to let her know that I called in her some ultram 50 mg 1-2 tabs q 4 hrs prn for pain #30 without refills. Called into [place] [phone]

## 2012-07-05 NOTE — Telephone Encounter (Signed)
Patient's husband called to request a refill of patient's oxycodone 5/325. Please advise. [phone].

## 2012-07-14 HISTORY — PX: BONE MARROW BIOPSY: SHX199

## 2012-07-15 ENCOUNTER — Ambulatory Visit: Payer: Commercial Managed Care - PPO

## 2012-07-15 ENCOUNTER — Other Ambulatory Visit (HOSPITAL_BASED_OUTPATIENT_CLINIC_OR_DEPARTMENT_OTHER): Payer: Commercial Managed Care - PPO | Admitting: Lab

## 2012-07-15 VITALS — BP 170/100 | HR 83 | Temp 98.0°F

## 2012-07-15 DIAGNOSIS — D638 Anemia in other chronic diseases classified elsewhere: Secondary | ICD-10-CM

## 2012-07-15 DIAGNOSIS — D649 Anemia, unspecified: Secondary | ICD-10-CM

## 2012-07-15 DIAGNOSIS — D539 Nutritional anemia, unspecified: Secondary | ICD-10-CM

## 2012-07-15 LAB — CBC WITH DIFFERENTIAL/PLATELET
EOS%: 5.4 % (ref 0.0–7.0)
Eosinophils Absolute: 0.4 10*3/uL (ref 0.0–0.5)
LYMPH%: 34.4 % (ref 14.0–49.7)
MCH: 34.1 pg — ABNORMAL HIGH (ref 25.1–34.0)
MCHC: 33.4 g/dL (ref 31.5–36.0)
MCV: 102 fL — ABNORMAL HIGH (ref 79.5–101.0)
MONO%: 11.1 % (ref 0.0–14.0)
NEUT#: 3.6 10*3/uL (ref 1.5–6.5)
Platelets: 373 10*3/uL (ref 145–400)
RBC: 3.01 10*6/uL — ABNORMAL LOW (ref 3.70–5.45)

## 2012-07-15 MED ORDER — DARBEPOETIN ALFA-POLYSORBATE 300 MCG/0.6ML IJ SOLN
300.0000 ug | Freq: Once | INTRAMUSCULAR | Status: DC
  Filled 2012-07-15: qty 0.6

## 2012-07-15 NOTE — Progress Notes (Signed)
[name] here for lab and Aranesp injection.  HGB 10.3, which is up from 8.1 three weeks ago. Checked BP left arm 176/90-83,  5 minutes later in left lower arm, BP 170/98-83,   Rechecked left arm with manuel cuff BP 170/100.  Dr Arbutus Ped not here at this time.  Spoke with Dr Truett Perna about this patient.  He recommended that we hold the Aranesp today and recheck later. She will keep her appointment as scheduled in 3 weeks unless we contact her otherwise. I also instructed patient to go to PCP about BP.  She has been on BP meds in the past, but has been off them since February when she was having some dizziness and her BP was low. Patient and her husband were in agreement this plan.

## 2012-07-19 ENCOUNTER — Ambulatory Visit (INDEPENDENT_AMBULATORY_CARE_PROVIDER_SITE_OTHER): Payer: Commercial Managed Care - PPO | Admitting: Surgery

## 2012-07-19 ENCOUNTER — Encounter (INDEPENDENT_AMBULATORY_CARE_PROVIDER_SITE_OTHER): Payer: Self-pay | Admitting: Surgery

## 2012-07-19 VITALS — BP 138/81 | HR 72 | Temp 97.4°F | Resp 16 | Ht 62.0 in | Wt 221.8 lb

## 2012-07-19 DIAGNOSIS — Z09 Encounter for follow-up examination after completed treatment for conditions other than malignant neoplasm: Secondary | ICD-10-CM

## 2012-07-19 NOTE — Progress Notes (Signed)
Subjective:     Patient ID: [name], female   DOB: 10-Apr-1955, 57 y.o.   MRN: [id number]  HPI She is here for a postop visit. She is having to do minimal dressing changes to the midline wound.  Her hemoglobin is now up to 10.1. Her energy is returning  Review of Systems     Objective:   Physical Exam On exam, there is only a small open area which is only several millimeters in size in the lower part of the incision    Assessment:     Patient stable postop     Plan:     She will continue the current wound care and I will see her back in one month

## 2012-07-22 ENCOUNTER — Telehealth: Payer: Self-pay | Admitting: Internal Medicine

## 2012-07-22 ENCOUNTER — Telehealth: Payer: Self-pay | Admitting: *Deleted

## 2012-07-22 NOTE — Telephone Encounter (Signed)
Husband called to report new onset of mild swelling in his wife's neck and rash with patches of red. Developed over weekend. Does not itch. Rash is no where else on her body. Asking if this is related to her "bone disease"? Has not been in sun and no new meds, except she has recently resumed her Allopurinol 300 mg daily. Told him it is doubtful the rash is due to her anemia. Suggested trying OTC Benadryl and to call PCP if it progresses or does not resolve.

## 2012-07-22 NOTE — Telephone Encounter (Signed)
The pt husband called and advised that his wife is now ready to do the BX.  Is this something you were ordering them to do? Please add pof. sent this to Dr. Arbutus Ped

## 2012-07-25 ENCOUNTER — Other Ambulatory Visit: Payer: Self-pay | Admitting: Medical Oncology

## 2012-07-25 DIAGNOSIS — D649 Anemia, unspecified: Secondary | ICD-10-CM

## 2012-07-29 ENCOUNTER — Other Ambulatory Visit: Payer: Self-pay | Admitting: Radiology

## 2012-07-30 ENCOUNTER — Ambulatory Visit (HOSPITAL_COMMUNITY)
Admission: RE | Admit: 2012-07-30 | Discharge: 2012-07-30 | Disposition: A | Discharge status: Home / Self Care | Payer: Commercial Managed Care - PPO | Source: Ambulatory Visit | Attending: Internal Medicine | Admitting: Internal Medicine

## 2012-07-30 ENCOUNTER — Encounter (HOSPITAL_COMMUNITY): Payer: Self-pay

## 2012-07-30 DIAGNOSIS — I10 Essential (primary) hypertension: Secondary | ICD-10-CM | POA: Insufficient documentation

## 2012-07-30 DIAGNOSIS — K219 Gastro-esophageal reflux disease without esophagitis: Secondary | ICD-10-CM | POA: Insufficient documentation

## 2012-07-30 DIAGNOSIS — Z79899 Other long term (current) drug therapy: Secondary | ICD-10-CM | POA: Insufficient documentation

## 2012-07-30 DIAGNOSIS — D72822 Plasmacytosis: Secondary | ICD-10-CM | POA: Insufficient documentation

## 2012-07-30 DIAGNOSIS — D649 Anemia, unspecified: Secondary | ICD-10-CM

## 2012-07-30 DIAGNOSIS — K754 Autoimmune hepatitis: Secondary | ICD-10-CM | POA: Insufficient documentation

## 2012-07-30 DIAGNOSIS — Z859 Personal history of malignant neoplasm, unspecified: Secondary | ICD-10-CM | POA: Insufficient documentation

## 2012-07-30 DIAGNOSIS — M069 Rheumatoid arthritis, unspecified: Secondary | ICD-10-CM | POA: Insufficient documentation

## 2012-07-30 LAB — APTT: aPTT: 52 seconds — ABNORMAL HIGH (ref 24–37)

## 2012-07-30 LAB — CBC
Hemoglobin: 11 g/dL — ABNORMAL LOW (ref 12.0–15.0)
MCHC: 33.8 g/dL (ref 30.0–36.0)
RBC: 3.28 MIL/uL — ABNORMAL LOW (ref 3.87–5.11)

## 2012-07-30 LAB — PROTIME-INR
INR: 1 (ref 0.00–1.49)
Prothrombin Time: 13.1 seconds (ref 11.6–15.2)

## 2012-07-30 MED ORDER — HYDROCODONE-ACETAMINOPHEN 5-325 MG PO TABS
1.0000 | ORAL_TABLET | ORAL | Status: DC | PRN
  Administered 2012-07-30: 2 via ORAL
  Filled 2012-07-30 (×2): qty 2

## 2012-07-30 MED ORDER — SODIUM CHLORIDE 0.9 % IV SOLN
INTRAVENOUS | Status: DC
  Administered 2012-07-30: 500 mL via INTRAVENOUS

## 2012-07-30 MED ORDER — FENTANYL CITRATE 0.05 MG/ML IJ SOLN
INTRAMUSCULAR | Status: AC
  Filled 2012-07-30: qty 4

## 2012-07-30 MED ORDER — MIDAZOLAM HCL 2 MG/2ML IJ SOLN
INTRAMUSCULAR | Status: AC
  Filled 2012-07-30: qty 4

## 2012-07-30 MED ORDER — MIDAZOLAM HCL 2 MG/2ML IJ SOLN
INTRAMUSCULAR | Status: AC | PRN
  Administered 2012-07-30: 2 mg via INTRAVENOUS
  Administered 2012-07-30: 1 mg via INTRAVENOUS

## 2012-07-30 MED ORDER — FENTANYL CITRATE 0.05 MG/ML IJ SOLN
INTRAMUSCULAR | Status: AC | PRN
  Administered 2012-07-30 (×3): 50 ug via INTRAVENOUS

## 2012-07-30 NOTE — Procedures (Addendum)
CT bone marrow LEFT iliac No complication No blood loss. See complete dictation in [facility].

## 2012-07-30 NOTE — H&P (Signed)
Chief Complaint: "I'm here for a bone marrow biopsy" Referring Physician:Mohamed HPI: [name] is an 57 y.o. female with anemia of uncertain etiology. She had a gastric carcinoid tumor removed a few months ago and is recovering well from that. She is scheduled for BM biopsy today as part of her workup. PMHx and meds reviewed.   Past Medical History:  Past Medical History  Diagnosis Date  . Hypertension   . Gout   . GERD (gastroesophageal reflux disease)   . History of kidney cancer   . Autoimmune hepatitis   . Rheumatoid arthritis(714.0)   . Anemia   . PONV (postoperative nausea and vomiting)   . Shortness of breath     exertion  . Cancer 2006    renal cancer    Past Surgical History:  Past Surgical History  Procedure Laterality Date  . Abdominal hysterectomy    . Cholecystectomy, laparoscopic    . Partial nephrectomy    . Eus N/A 04/17/2012    Procedure: ESOPHAGEAL ENDOSCOPIC ULTRASOUND (EUS) RADIAL;  Surgeon: Willis Modena, MD;  Location: [facility];  Service: Endoscopy;  Laterality: N/A;  . Tonsillectomy and adenoidectomy    . Breast biopsy      "several"  . Cholecystectomy    . Wisdom tooth extraction    . Finger surgery      mass removed  . Gastrectomy N/A 05/08/2012    Procedure: Excision gastric mass, possible partial gastrectomy;  Surgeon: Shelly Rubenstein, MD;  Location: [facility] OR;  Service: General;  Laterality: N/A;    Family History:  Family History  Problem Relation Age of Onset  . Heart failure Father   . Diabetes Father   . Diverticulitis Mother   . Cancer Mother     breast  . Cancer Maternal Grandmother     colon    Social History:  reports that she has never smoked. She has never used smokeless tobacco. She reports that  drinks alcohol. She reports that she does not use illicit drugs.  Allergies:  Allergies  Allergen Reactions  . Aspirin Nausea Only  . Lisinopril Cough  . Methotrexate Derivatives Other (See Comments)    Elevated  liver enzymes  . Morphine And Related Nausea And Vomiting  . Other Swelling    All mycins-swelling  . Penicillins Other (See Comments)    Yeast infection  . Aloe Rash    Burn rash  . Lactose Intolerance (Gi) Nausea Only  . Latex Rash    sensitivity  . Prozac (Fluoxetine Hcl) Rash    Medications: allopurinol (ZYLOPRIM) 300 MG tablet (Taking) Sig - Route: Take 300 mg by mouth daily. - Oral Class: Historical Med Number of times this order has been changed since signing: 1 [address] Ascorbic Acid (VITAMIN C PO) (Taking) Sig - Route: Take by mouth. - Oral Class: Historical Med Carboxymethylcellulose Sodium (THERATEARS OP) (Taking) Sig - Route: Apply 1 drop to eye every morning. - Ophthalmic Class: Historical Med Number of times this order has been changed since signing: 1 Order [address] cyclobenzaprine (FLEXERIL) 10 MG tablet (Taking) Sig - Route: Take 10 mg by mouth at bedtime. - Oral Class: Historical Med Number of times this order has been changed since signing: 3 Order [address] esomeprazole (NEXIUM) 40 MG capsule (Taking) Sig - Route: Take 40 mg by mouth 2 (two) times daily. - Oral Class: Historical Med Number of times this order has been changed since signing: 1 Order [address] furosemide (LASIX) 20 MG  tablet (Taking) Sig - Route: Take 20 mg by mouth 2 (two) times daily. - Oral Class: Historical Med losartan-hydrochlorothiazide (HYZAAR) 100-25 MG per tablet (Taking) Sig - Route: Take 1 tablet by mouth daily. - Oral Class: Historical Med Multiple Vitamins-Minerals (ZINC PO) (Taking) Sig - Route: Take by mouth. - Oral Class: Historical Med oxyCODONE-acetaminophen (PERCOCET/ROXICET) 5-325 MG per tablet (Taking) Sig - Route: Take 1 tablet by mouth every 4 (four) hours as needed for pain (1-2 tablets q4 hrs prn). - Oral Class: Historical Med potassium chloride SA (K-DUR,KLOR-CON) 20 MEQ tablet (Taking) Sig - Route: Take 20 mEq by mouth 2 (two) times daily. - Oral Class: Historical Med  traMADol (ULTRAM) 50 MG tablet (Taking) Sig - Route: Take 50 mg by mouth every 6 (six) hours as needed for pain. - Oral Class: Historical Med VITAMIN A PO (Taking) Sig - Route: Take by mou    Please HPI for pertinent positives, otherwise complete 10 system ROS negative.  Physical Exam: BP 154/84  Pulse 82  Temp(Src) 98.4 F (36.9 C) (Oral)  Resp 17  Ht 5\' 2"  (1.575 m)  Wt 221 lb (100.245 kg)  BMI 40.41 kg/m2  SpO2 98% Body mass index is 40.41 kg/(m^2).   General Appearance:  Alert, cooperative, no distress, appears stated age  Head:  Normocephalic, without obvious abnormality, atraumatic  ENT: Unremarkable  Neck: Supple, symmetrical, trachea midline  Lungs:   Clear to auscultation bilaterally, no w/r/r, respirations unlabored without use of accessory muscles.  Heart:  Regular rate and rhythm, S1, S2 normal, no murmur, rub or gallop.  Neurologic: Normal affect, no gross deficits.   Results for orders placed during the hospital encounter of 07/30/12 (from the past 48 hour(s))  APTT     Status: Abnormal   Collection Time    07/30/12  8:45 AM      Result Value Range   aPTT 52 (*) 24 - 37 seconds   Comment:            IF BASELINE aPTT IS ELEVATED,     SUGGEST PATIENT RISK ASSESSMENT     BE USED TO DETERMINE APPROPRIATE     ANTICOAGULANT THERAPY.  CBC     Status: Abnormal   Collection Time    07/30/12  8:45 AM      Result Value Range   WBC 6.8  4.0 - 10.5 K/uL   RBC 3.28 (*) 3.87 - 5.11 MIL/uL   Hemoglobin 11.0 (*) 12.0 - 15.0 g/dL   HCT 16.1 (*) 09.6 - 04.5 %   MCV 99.1  78.0 - 100.0 fL   MCH 33.5  26.0 - 34.0 pg   MCHC 33.8  30.0 - 36.0 g/dL   RDW 40.9  81.1 - 91.4 %   Platelets 357  150 - 400 K/uL  PROTIME-INR     Status: None   Collection Time    07/30/12  8:45 AM      Result Value Range   Prothrombin Time 13.1  11.6 - 15.2 seconds   INR 1.00  0.00 - 1.49   No results found.  Assessment/Plan Anemia of uncertain etiology. Discussed procedure, risks,  complications, use of sedation. Labs reviewed, ok Consent signed in chart  Brayton El PA-C 07/30/2012, 10:10 AM

## 2012-08-01 ENCOUNTER — Other Ambulatory Visit (HOSPITAL_BASED_OUTPATIENT_CLINIC_OR_DEPARTMENT_OTHER): Payer: Commercial Managed Care - PPO | Admitting: Lab

## 2012-08-01 ENCOUNTER — Ambulatory Visit: Payer: Commercial Managed Care - PPO

## 2012-08-01 ENCOUNTER — Telehealth: Payer: Self-pay | Admitting: Internal Medicine

## 2012-08-01 ENCOUNTER — Ambulatory Visit (HOSPITAL_BASED_OUTPATIENT_CLINIC_OR_DEPARTMENT_OTHER): Payer: Commercial Managed Care - PPO | Admitting: Internal Medicine

## 2012-08-01 VITALS — BP 159/96 | HR 78 | Temp 98.5°F | Resp 18 | Ht 62.0 in | Wt 220.2 lb

## 2012-08-01 DIAGNOSIS — D638 Anemia in other chronic diseases classified elsewhere: Secondary | ICD-10-CM

## 2012-08-01 DIAGNOSIS — D539 Nutritional anemia, unspecified: Secondary | ICD-10-CM

## 2012-08-01 LAB — CBC WITH DIFFERENTIAL/PLATELET
BASO%: 0.7 % (ref 0.0–2.0)
EOS%: 4.6 % (ref 0.0–7.0)
LYMPH%: 31.5 % (ref 14.0–49.7)
MCH: 34.6 pg — ABNORMAL HIGH (ref 25.1–34.0)
MCHC: 35 g/dL (ref 31.5–36.0)
MCV: 99 fL (ref 79.5–101.0)
MONO#: 0.3 10*3/uL (ref 0.1–0.9)
MONO%: 4.2 % (ref 0.0–14.0)
Platelets: 304 10*3/uL (ref 145–400)
RBC: 3.16 10*6/uL — ABNORMAL LOW (ref 3.70–5.45)
WBC: 7.3 10*3/uL (ref 3.9–10.3)

## 2012-08-01 NOTE — Progress Notes (Signed)
[name] here for office visit and injection appointment.  Hgb 10.9, doesn't need Aranesp according to MD.

## 2012-08-01 NOTE — Telephone Encounter (Signed)
gv and printed appt sched and avs for pt  °

## 2012-08-01 NOTE — Progress Notes (Signed)
[facility] [phone]   [phone]  OFFICE PROGRESS NOTE  [name], MD [address] [address]  DIAGNOSIS: Persistent anemia questionable for anemia of chronic disease   PRIOR THERAPY: None   CURRENT THERAPY: Aranesp 300 mcg subcutaneously every 3 weeks  INTERVAL HISTORY: [name] 57 y.o. female returns to the clinic today for follow up visit accompanied her husband. The patient is feeling fine today with no specific complaints except for mild fatigue. She is tolerating her treatment with Aranesp fairly well. She recently underwent a bone marrow biopsy and aspirate and she is here for evaluation and discussion of her biopsy results. She denied having any significant fever or chills. She has no nausea or vomiting. She has no significant chest pain, shortness of breath, cough or hemoptysis.  MEDICAL HISTORY: Past Medical History  Diagnosis Date  . Hypertension   . Gout   . GERD (gastroesophageal reflux disease)   . History of kidney cancer   . Autoimmune hepatitis   . Rheumatoid arthritis(714.0)   . Anemia   . PONV (postoperative nausea and vomiting)   . Shortness of breath     exertion  . Cancer 2006    renal cancer    ALLERGIES:  is allergic to aspirin; lisinopril; methotrexate derivatives; morphine and related; other; penicillins; aloe; lactose intolerance (gi); latex; and prozac.  MEDICATIONS:  Current Outpatient Prescriptions  Medication Sig Dispense Refill  . allopurinol (ZYLOPRIM) 300 MG tablet Take 300 mg by mouth daily.      . Ascorbic Acid (VITAMIN C PO) Take by mouth.      . Carboxymethylcellulose Sodium (THERATEARS OP) Apply 1 drop to eye every morning.      . cyclobenzaprine (FLEXERIL) 10 MG tablet Take 10 mg by mouth at bedtime.       Marland Kitchen esomeprazole (NEXIUM) 40 MG capsule Take 40 mg by mouth 2 (two) times daily.      . furosemide (LASIX) 20 MG tablet Take 20 mg by mouth 2 (two) times  daily.      Marland Kitchen losartan-hydrochlorothiazide (HYZAAR) 100-25 MG per tablet Take 1 tablet by mouth daily.      . Multiple Vitamins-Minerals (ZINC PO) Take by mouth.      . oxyCODONE-acetaminophen (PERCOCET/ROXICET) 5-325 MG per tablet Take 1 tablet by mouth every 4 (four) hours as needed for pain (1-2 tablets q4 hrs prn).      . potassium chloride SA (K-DUR,KLOR-CON) 20 MEQ tablet Take 20 mEq by mouth 2 (two) times daily.      . traMADol (ULTRAM) 50 MG tablet Take 50 mg by mouth every 6 (six) hours as needed for pain.      Marland Kitchen VITAMIN A PO Take by mouth.       No current facility-administered medications for this visit.    SURGICAL HISTORY:  Past Surgical History  Procedure Laterality Date  . Abdominal hysterectomy    . Cholecystectomy, laparoscopic    . Partial nephrectomy    . Eus N/A 04/17/2012    Procedure: ESOPHAGEAL ENDOSCOPIC ULTRASOUND (EUS) RADIAL;  Surgeon: Willis Modena, MD;  Location: [facility];  Service: Endoscopy;  Laterality: N/A;  . Tonsillectomy and adenoidectomy    . Breast biopsy      "several"  . Cholecystectomy    . Wisdom tooth extraction    . Finger surgery      mass removed  . Gastrectomy N/A 05/08/2012    Procedure:  Excision gastric mass, possible partial gastrectomy;  Surgeon: Shelly Rubenstein, MD;  Location: [facility] OR;  Service: General;  Laterality: N/A;    REVIEW OF SYSTEMS:  A comprehensive review of systems was negative except for: Constitutional: positive for fatigue   PHYSICAL EXAMINATION: General appearance: alert, cooperative, fatigued and no distress Head: Normocephalic, without obvious abnormality, atraumatic Neck: no adenopathy Lymph nodes: Cervical, supraclavicular, and axillary nodes normal. Resp: clear to auscultation bilaterally Cardio: regular rate and rhythm, S1, S2 normal, no murmur, click, rub or gallop GI: soft, non-tender; bowel sounds normal; no masses,  no organomegaly Extremities: extremities normal, atraumatic, no cyanosis or  edema  ECOG PERFORMANCE STATUS: 1 - Symptomatic but completely ambulatory  Blood pressure 159/96, pulse 78, temperature 98.5 F (36.9 C), temperature source Oral, resp. rate 18, height 5\' 2"  (1.575 m), weight 220 lb 3.2 oz (99.882 kg).  LABORATORY DATA: Lab Results  Component Value Date   WBC 7.3 08/01/2012   HGB 10.9* 08/01/2012   HCT 31.3* 08/01/2012   MCV 99.0 08/01/2012   PLT 304 08/01/2012      Chemistry      Component Value Date/Time   NA 134* 05/17/2012 0535   NA 141 04/23/2012 1310   K 3.2* 05/17/2012 0535   K 3.8 04/23/2012 1310   CL 95* 05/17/2012 0535   CL 106 04/23/2012 1310   CO2 32 05/17/2012 0535   CO2 26 04/23/2012 1310   BUN 5* 05/17/2012 0535   BUN 12.7 04/23/2012 1310   CREATININE 0.83 05/17/2012 0535   CREATININE 0.9 04/23/2012 1310      Component Value Date/Time   CALCIUM 8.7 05/17/2012 0535   CALCIUM 9.4 04/23/2012 1310   ALKPHOS 164* 05/08/2012 1000   ALKPHOS 135 04/23/2012 1310   AST 37 05/08/2012 1000   AST 36* 04/23/2012 1310   ALT 43* 05/08/2012 1000   ALT 35 04/23/2012 1310   BILITOT 0.6 05/08/2012 1000   BILITOT 0.69 04/23/2012 1310       RADIOGRAPHIC STUDIES: Ct Biopsy  07/30/2012   *RADIOLOGY REPORT*  Clinical data: Anemia of unknown etiology  CT GUIDED DEEP ILIAC BONE ASPIRATION AND CORE BIOPSY:  Technique: The procedure, risks (including but not limited to bleeding, infection, organ damage), benefits, and alternatives were explained to the patient.  Questions regarding the procedure were encouraged and answered.  The patient understands and consents to the procedure. Patient was placed supine on the CT gantry and limited axial scans through the pelvis were obtained. Appropriate skin entry site was identified. Skin site was marked, prepped with Betadine,   draped in usual sterile fashion, and infiltrated locally with 1% lidocaine.  Intravenous Fentanyl and Versed were administered as conscious sedation during continuous cardiorespiratory monitoring by the radiology RN,  with a total moderate sedation time of 30 minutes.  Under CT fluoroscopic guidance an 11-gauge Cook trocar bone needle was advanced into the  iliac bone just lateral to the sacroiliac joint. Once needle tip position was confirmed, coaxial core and aspiration samples were obtained. The final sample was obtained using the guiding needle itself, which was then removed. Postprocedure scans show no hematoma or fracture. Patient tolerated procedure well, with no immediate complication.  IMPRESSION:  1. Technically successful CT guided  iliac bone core and aspiration biopsy.   Original Report Authenticated By: D. Andria Rhein, MD   ORT FINAL DIAGNOSIS Diagnosis Bone Marrow, Aspirate,Biopsy, and Clot, left iliac bone - BONE MARROW: SLIGHTLY HYPERCELLULAR MARROW WITH SMALL LYMPHOID AGGREGATES AND PLASMACYTOSIS (SEE COMMENT).  NO METASTATIC CARCINOMA IDENTIFIED. - PERIPHERAL BLOOD, NORMOCYTIC ANEMIA. Diagnosis Note The marrow is slightly hypercellular for age. Immunohistochemistry with CD138 shows an estimated 10 to 15% plasma cells and kappa and lambda does not show light chain restriction. No metastatic carcinoma is identified. Case discussed with Dr. Arbutus Ped on 08/01/12. (JDP:ecj 07/31/2012) Jimmy Picket MD Pathologist, Electronic Signature (Case signed 08/01/2012)  ASSESSMENT AND PLAN: this is a very pleasant 57 years old white female with persistent anemia most likely anemia of chronic disease. The bone marrow biopsy and aspirate showed elevated plasma cells with no kappa or lambda light chain restriction, and no evidence of metastatic carcinoma identified.  I recommended for the patient to continue on Aranesp injection every 3 weeks. She has elevated IgG but no detectable M spike.  I would see the patient back for follow up visit in 3 months for reevaluation with repeat CBC and myeloma panel.  She was advised to call immediately if she has any concerning symptoms in the interval.  All questions were  answered. The patient knows to call the clinic with any problems, questions or concerns. We can certainly see the patient much sooner if necessary.  I spent 25 minutes face to face with the patient with more than 50% in consultation regarding her condition.

## 2012-08-03 ENCOUNTER — Encounter: Payer: Self-pay | Admitting: Internal Medicine

## 2012-08-03 NOTE — Patient Instructions (Signed)
Continue Aranesp every 3 weeks. Follow up visit in 3 months with repeat myeloma panel

## 2012-08-05 ENCOUNTER — Telehealth: Payer: Self-pay | Admitting: Medical Oncology

## 2012-08-05 NOTE — Telephone Encounter (Signed)
Note to dr Mohamed. 

## 2012-08-06 ENCOUNTER — Telehealth: Payer: Self-pay | Admitting: Internal Medicine

## 2012-08-06 ENCOUNTER — Other Ambulatory Visit: Payer: Self-pay | Admitting: Medical Oncology

## 2012-08-06 NOTE — Telephone Encounter (Signed)
s.w. pt husband and advised on 7.2.14 appt....ok and aware

## 2012-08-06 NOTE — Progress Notes (Signed)
I called husband and told him someone will call him with f/u appt for BM results.

## 2012-08-13 ENCOUNTER — Ambulatory Visit (HOSPITAL_BASED_OUTPATIENT_CLINIC_OR_DEPARTMENT_OTHER): Payer: Commercial Managed Care - PPO | Admitting: Internal Medicine

## 2012-08-13 ENCOUNTER — Encounter: Payer: Self-pay | Admitting: Internal Medicine

## 2012-08-13 ENCOUNTER — Ambulatory Visit: Payer: Commercial Managed Care - PPO | Admitting: Lab

## 2012-08-13 ENCOUNTER — Telehealth: Payer: Self-pay | Admitting: Internal Medicine

## 2012-08-13 VITALS — BP 134/76 | HR 91 | Temp 99.3°F | Resp 18 | Ht 62.0 in | Wt 217.8 lb

## 2012-08-13 DIAGNOSIS — D649 Anemia, unspecified: Secondary | ICD-10-CM

## 2012-08-13 DIAGNOSIS — D539 Nutritional anemia, unspecified: Secondary | ICD-10-CM

## 2012-08-13 LAB — COMPREHENSIVE METABOLIC PANEL (CC13)
Albumin: 3.4 g/dL — ABNORMAL LOW (ref 3.5–5.0)
Alkaline Phosphatase: 175 U/L — ABNORMAL HIGH (ref 40–150)
BUN: 9.2 mg/dL (ref 7.0–26.0)
CO2: 29 mEq/L (ref 22–29)
Glucose: 94 mg/dl (ref 70–140)
Total Bilirubin: 0.45 mg/dL (ref 0.20–1.20)

## 2012-08-13 LAB — CBC & DIFF AND RETIC
BASO%: 0.1 % (ref 0.0–2.0)
LYMPH%: 32 % (ref 14.0–49.7)
MCH: 32.8 pg (ref 25.1–34.0)
MCHC: 33.4 g/dL (ref 31.5–36.0)
MCV: 98 fL (ref 79.5–101.0)
MONO%: 2 % (ref 0.0–14.0)
NEUT%: 62.6 % (ref 38.4–76.8)
Platelets: 323 10*3/uL (ref 145–400)
RBC: 3.05 10*6/uL — ABNORMAL LOW (ref 3.70–5.45)
Retic %: 1 % (ref 0.70–2.10)

## 2012-08-13 LAB — LACTATE DEHYDROGENASE (CC13): LDH: 136 U/L (ref 125–245)

## 2012-08-13 NOTE — Progress Notes (Signed)
Quick Note:  Call patient with the result and order K DUR 20 meq po qd X 7 days ______

## 2012-08-13 NOTE — Progress Notes (Signed)
[facility] Telephone:(336) [phone]   Fax:([phone]  OFFICE PROGRESS NOTE  [name], MD [address] [place] [address]  DIAGNOSIS: Persistent anemia questionable for anemia of chronic disease   PRIOR THERAPY: None   CURRENT THERAPY: Aranesp 300 mcg subcutaneously every 3 weeks  INTERVAL HISTORY: [name] 57 y.o. female returns to the clinic today for followup visit accompanied by her husband. The patient is feeling fine today except for a very mild fatigue. She notes significant improvement in her condition after starting treatment with Aranesp. She had a bone marrow biopsy and aspirate performed recently and she is here for evaluation and discussion of her biopsy results. She is tolerating her treatment with Aranesp fairly well. She denied having any significant bleeding issues. She denied having any bruises or ecchymosis. She denied having any significant chest pain but continues to have shortness of breath with exertion no cough or hemoptysis. She was recently started on Imuran for autoimmune condition and arthritis.   MEDICAL HISTORY: Past Medical History  Diagnosis Date  . Hypertension   . Gout   . GERD (gastroesophageal reflux disease)   . History of kidney cancer   . Autoimmune hepatitis   . Rheumatoid arthritis(714.0)   . Anemia   . PONV (postoperative nausea and vomiting)   . Shortness of breath     exertion  . Cancer 2006    renal cancer    ALLERGIES:  is allergic to aspirin; lisinopril; methotrexate derivatives; morphine and related; other; penicillins; aloe; lactose intolerance (gi); latex; and prozac.  MEDICATIONS:  Current Outpatient Prescriptions  Medication Sig Dispense Refill  . allopurinol (ZYLOPRIM) 300 MG tablet Take 300 mg by mouth daily.      . Ascorbic Acid (VITAMIN C PO) Take by mouth.      . Carboxymethylcellulose Sodium (THERATEARS OP) Apply 1 drop to eye every morning.      . cyclobenzaprine  (FLEXERIL) 10 MG tablet Take 10 mg by mouth at bedtime.       Marland Kitchen esomeprazole (NEXIUM) 40 MG capsule Take 40 mg by mouth 2 (two) times daily.      . furosemide (LASIX) 20 MG tablet Take 20 mg by mouth 2 (two) times daily.      Marland Kitchen losartan-hydrochlorothiazide (HYZAAR) 100-25 MG per tablet Take 1 tablet by mouth daily.      . Multiple Vitamins-Minerals (ZINC PO) Take by mouth.      . oxyCODONE-acetaminophen (PERCOCET/ROXICET) 5-325 MG per tablet Take 1 tablet by mouth every 4 (four) hours as needed for pain (1-2 tablets q4 hrs prn).      . potassium chloride SA (K-DUR,KLOR-CON) 20 MEQ tablet Take 20 mEq by mouth 2 (two) times daily.      . traMADol (ULTRAM) 50 MG tablet Take 50 mg by mouth every 6 (six) hours as needed for pain.      Marland Kitchen VITAMIN A PO Take by mouth.       No current facility-administered medications for this visit.    SURGICAL HISTORY:  Past Surgical History  Procedure Laterality Date  . Abdominal hysterectomy    . Cholecystectomy, laparoscopic    . Partial nephrectomy    . Eus N/A 04/17/2012    Procedure: ESOPHAGEAL ENDOSCOPIC ULTRASOUND (EUS) RADIAL;  Surgeon: Willis Modena, MD;  Location: [facility];  Service: Endoscopy;  Laterality: N/A;  . Tonsillectomy and adenoidectomy    . Breast biopsy      "several"  .  Cholecystectomy    . Wisdom tooth extraction    . Finger surgery      mass removed  . Gastrectomy N/A 05/08/2012    Procedure: Excision gastric mass, possible partial gastrectomy;  Surgeon: Shelly Rubenstein, MD;  Location: [facility] OR;  Service: General;  Laterality: N/A;    REVIEW OF SYSTEMS:  A comprehensive review of systems was negative except for: Constitutional: positive for fatigue Respiratory: positive for dyspnea on exertion   PHYSICAL EXAMINATION: General appearance: alert, cooperative and no distress Head: Normocephalic, without obvious abnormality, atraumatic Neck: no adenopathy Lymph nodes: Cervical, supraclavicular, and axillary nodes normal. Resp:  clear to auscultation bilaterally Cardio: regular rate and rhythm, S1, S2 normal, no murmur, click, rub or gallop GI: soft, non-tender; bowel sounds normal; no masses,  no organomegaly Extremities: extremities normal, atraumatic, no cyanosis or edema  ECOG PERFORMANCE STATUS: 1 - Symptomatic but completely ambulatory  Blood pressure 134/76, pulse 91, temperature 99.3 F (37.4 C), temperature source Oral, resp. rate 18, height 5\' 2"  (1.575 m), weight 217 lb 12.8 oz (98.793 kg), SpO2 96.00%.  LABORATORY DATA: Lab Results  Component Value Date   WBC 7.3 08/01/2012   HGB 10.9* 08/01/2012   HCT 31.3* 08/01/2012   MCV 99.0 08/01/2012   PLT 304 08/01/2012      Chemistry      Component Value Date/Time   NA 134* 05/17/2012 0535   NA 141 04/23/2012 1310   K 3.2* 05/17/2012 0535   K 3.8 04/23/2012 1310   CL 95* 05/17/2012 0535   CL 106 04/23/2012 1310   CO2 32 05/17/2012 0535   CO2 26 04/23/2012 1310   BUN 5* 05/17/2012 0535   BUN 12.7 04/23/2012 1310   CREATININE 0.83 05/17/2012 0535   CREATININE 0.9 04/23/2012 1310      Component Value Date/Time   CALCIUM 8.7 05/17/2012 0535   CALCIUM 9.4 04/23/2012 1310   ALKPHOS 164* 05/08/2012 1000   ALKPHOS 135 04/23/2012 1310   AST 37 05/08/2012 1000   AST 36* 04/23/2012 1310   ALT 43* 05/08/2012 1000   ALT 35 04/23/2012 1310   BILITOT 0.6 05/08/2012 1000   BILITOT 0.69 04/23/2012 1310       RADIOGRAPHIC STUDIES: Ct Biopsy  07/30/2012   *RADIOLOGY REPORT*  Clinical data: Anemia of unknown etiology  CT GUIDED DEEP ILIAC BONE ASPIRATION AND CORE BIOPSY:  Technique: The procedure, risks (including but not limited to bleeding, infection, organ damage), benefits, and alternatives were explained to the patient.  Questions regarding the procedure were encouraged and answered.  The patient understands and consents to the procedure. Patient was placed supine on the CT gantry and limited axial scans through the pelvis were obtained. Appropriate skin entry site was identified.  Skin site was marked, prepped with Betadine,   draped in usual sterile fashion, and infiltrated locally with 1% lidocaine.  Intravenous Fentanyl and Versed were administered as conscious sedation during continuous cardiorespiratory monitoring by the radiology RN, with a total moderate sedation time of 30 minutes.  Under CT fluoroscopic guidance an 11-gauge Cook trocar bone needle was advanced into the  iliac bone just lateral to the sacroiliac joint. Once needle tip position was confirmed, coaxial core and aspiration samples were obtained. The final sample was obtained using the guiding needle itself, which was then removed. Postprocedure scans show no hematoma or fracture. Patient tolerated procedure well, with no immediate complication.  IMPRESSION:  1. Technically successful CT guided  iliac bone core and aspiration biopsy.  Original Report Authenticated By: D. Andria Rhein, MD  NE MARROW REPORT FINAL DIAGNOSIS Diagnosis Bone Marrow, Aspirate,Biopsy, and Clot, left iliac bone - BONE MARROW: SLIGHTLY HYPERCELLULAR MARROW WITH SMALL LYMPHOID AGGREGATES AND PLASMACYTOSIS (SEE COMMENT). NO METASTATIC CARCINOMA IDENTIFIED. - PERIPHERAL BLOOD, NORMOCYTIC ANEMIA. Diagnosis Note The marrow is slightly hypercellular for age. Immunohistochemistry with CD138 shows an estimated 10 to 15% plasma cells and kappa and lambda does not show light chain restriction. No metastatic carcinoma is identified. Case discussed with Dr. Arbutus Ped on 08/01/12. (JDP:ecj 07/31/2012) Jimmy Picket MD Pathologist, Electronic Signature (Case signed 08/01/2012)  ASSESSMENT AND PLAN: This is a very pleasant 57 years old white female with persistent anemia questionable for anemia of chronic disease. She is currently on treatment with Aranesp and there is significant improvement in her hemoglobin and hematocrit.  I recommended for the patient to continue her current treatment. I discussed the bone marrow biopsy and aspirate with the  patient and her husband. I ordered several studies today to rule out any myoclonic office your multiple myeloma including myeloma panel. I also ordered serum haptoglobin, DAT and LDH to rule out any hemolysis secondary to her autoimmune disorder. The patient would come back for followup visit in one month for reevaluation with repeat CBC. She was advised to call immediately if she has any concerning symptoms in the interval.  All questions were answered. The patient knows to call the clinic with any problems, questions or concerns. We can certainly see the patient much sooner if necessary.

## 2012-08-13 NOTE — Telephone Encounter (Signed)
gv pt appt schedule for July thru October. Per 7/1 pof f/u 1 month. Lb/fu for 1 month coord w/q3w lb/inj on 7/31.

## 2012-08-13 NOTE — Patient Instructions (Signed)
Continue treatment with Aranesp. Followup visit in one-month.

## 2012-08-14 ENCOUNTER — Telehealth: Payer: Self-pay | Admitting: *Deleted

## 2012-08-14 LAB — IGG, IGA, IGM
IgG (Immunoglobin G), Serum: 1960 mg/dL — ABNORMAL HIGH (ref 690–1700)
IgM, Serum: 80 mg/dL (ref 52–322)

## 2012-08-14 LAB — HAPTOGLOBIN: Haptoglobin: 267 mg/dL — ABNORMAL HIGH (ref 45–215)

## 2012-08-14 LAB — BETA 2 MICROGLOBULIN, SERUM: Beta-2 Microglobulin: 2.15 mg/L — ABNORMAL HIGH (ref 1.01–1.73)

## 2012-08-14 LAB — KAPPA/LAMBDA LIGHT CHAINS
Kappa:Lambda Ratio: 0.8 (ref 0.26–1.65)
Lambda Free Lght Chn: 4.64 mg/dL — ABNORMAL HIGH (ref 0.57–2.63)

## 2012-08-14 NOTE — Telephone Encounter (Signed)
Pt already takes daily of Kdur.  Per dr Donnald Garre pt needs to increase Kdur to x 7 days and we will reassess her kdur when she comes back, she verbalized understanding.  SLJ

## 2012-08-14 NOTE — Telephone Encounter (Signed)
Message copied by Caren Griffins on Wed Aug 14, 2012 11:34 AM ------      Message from: Si Gaul      Created: Tue Aug 13, 2012  7:36 PM       Call patient with the result and order K DUR 20 meq po qd X 7 days ------

## 2012-08-19 ENCOUNTER — Encounter (INDEPENDENT_AMBULATORY_CARE_PROVIDER_SITE_OTHER): Payer: Self-pay | Admitting: Surgery

## 2012-08-19 ENCOUNTER — Ambulatory Visit (INDEPENDENT_AMBULATORY_CARE_PROVIDER_SITE_OTHER): Payer: Commercial Managed Care - PPO | Admitting: Surgery

## 2012-08-19 ENCOUNTER — Telehealth: Payer: Self-pay | Admitting: Medical Oncology

## 2012-08-19 VITALS — BP 124/78 | HR 70 | Resp 18 | Ht 63.0 in | Wt 214.0 lb

## 2012-08-19 DIAGNOSIS — Z09 Encounter for follow-up examination after completed treatment for conditions other than malignant neoplasm: Secondary | ICD-10-CM

## 2012-08-19 NOTE — Telephone Encounter (Signed)
Requests lab results -note to Dr Arbutus Ped.

## 2012-08-19 NOTE — Progress Notes (Signed)
Subjective:     Patient ID: [name], female   DOB: 01-06-1956, 57 y.o.   MRN: [id number]  HPI She is here for final postoperative visit. She reports that her wound is totally healed  Review of Systems     Objective:   Physical Exam On exam, the midline wound is now completely healed over    Assessment:     Patient stable postop     Plan:     She may resume her normal activities. I will see her back as needed

## 2012-08-20 ENCOUNTER — Telehealth: Payer: Self-pay | Admitting: Medical Oncology

## 2012-08-20 ENCOUNTER — Other Ambulatory Visit: Payer: Self-pay | Admitting: Medical Oncology

## 2012-08-20 NOTE — Progress Notes (Signed)
Husband called about test results . Information called to husband.

## 2012-08-20 NOTE — Telephone Encounter (Signed)
Called to husband.  

## 2012-08-20 NOTE — Telephone Encounter (Signed)
Message copied by Charma Igo on Tue Aug 20, 2012 12:00 PM ------      Message from: Si Gaul      Created: Mon Aug 19, 2012  7:26 PM       No change in current plan. Will discuss next visit. No clear evidence for hemolysis.      ----- Message -----         From: Charma Igo, RN         Sent: 08/19/2012   4:35 PM           To: Si Gaul, MD            Husband asking about recent lab test results. What do you want me to tell pt?       ------

## 2012-08-22 ENCOUNTER — Other Ambulatory Visit (HOSPITAL_BASED_OUTPATIENT_CLINIC_OR_DEPARTMENT_OTHER): Payer: Commercial Managed Care - PPO | Admitting: Lab

## 2012-08-22 ENCOUNTER — Ambulatory Visit (HOSPITAL_BASED_OUTPATIENT_CLINIC_OR_DEPARTMENT_OTHER): Payer: Commercial Managed Care - PPO

## 2012-08-22 VITALS — BP 143/88 | HR 82 | Temp 98.4°F

## 2012-08-22 DIAGNOSIS — D649 Anemia, unspecified: Secondary | ICD-10-CM

## 2012-08-22 DIAGNOSIS — D638 Anemia in other chronic diseases classified elsewhere: Secondary | ICD-10-CM

## 2012-08-22 LAB — CBC WITH DIFFERENTIAL/PLATELET
BASO%: 0.7 % (ref 0.0–2.0)
EOS%: 3.4 % (ref 0.0–7.0)
LYMPH%: 40.1 % (ref 14.0–49.7)
MCH: 33.8 pg (ref 25.1–34.0)
MCHC: 34.8 g/dL (ref 31.5–36.0)
MONO#: 0.1 10*3/uL (ref 0.1–0.9)
Platelets: 213 10*3/uL (ref 145–400)
RBC: 2.99 10*6/uL — ABNORMAL LOW (ref 3.70–5.45)
WBC: 4.7 10*3/uL (ref 3.9–10.3)

## 2012-08-22 MED ORDER — DARBEPOETIN ALFA-POLYSORBATE 300 MCG/0.6ML IJ SOLN
300.0000 ug | Freq: Once | INTRAMUSCULAR | Status: AC
  Administered 2012-08-22: 300 ug via SUBCUTANEOUS
  Filled 2012-08-22: qty 0.6

## 2012-08-27 ENCOUNTER — Telehealth: Payer: Self-pay | Admitting: *Deleted

## 2012-08-27 NOTE — Telephone Encounter (Signed)
Pt's husband called stating that pt is having new issues (losing hair, stomach pains, nausea, questionable vomiting blood, back pain worsening, anxiety, not eating).  Per Dr Donnald Garre, this is not r/t her dx she is treated for at [facility] and she needs to contact Dr Randa Evens her gastroenterologist.  Pt's husband verbalized understanding and states he already called Dr Randa Evens' office.  SLJ

## 2012-09-11 ENCOUNTER — Telehealth: Payer: Self-pay | Admitting: *Deleted

## 2012-09-11 NOTE — Telephone Encounter (Signed)
Lab work dated 09/05/12 ordered by Dr Corliss Skains given to Dr Donnald Garre to review.  SLJ

## 2012-09-11 NOTE — Telephone Encounter (Signed)
Office note dated 09/05/12 from Dr Corliss Skains given to Dr Donnald Garre to review.  SLJ

## 2012-09-12 ENCOUNTER — Ambulatory Visit (HOSPITAL_BASED_OUTPATIENT_CLINIC_OR_DEPARTMENT_OTHER): Payer: Commercial Managed Care - PPO | Admitting: Internal Medicine

## 2012-09-12 ENCOUNTER — Telehealth: Payer: Self-pay | Admitting: Internal Medicine

## 2012-09-12 ENCOUNTER — Other Ambulatory Visit: Payer: Self-pay | Admitting: Medical Oncology

## 2012-09-12 ENCOUNTER — Other Ambulatory Visit (HOSPITAL_COMMUNITY): Payer: Self-pay | Admitting: Gastroenterology

## 2012-09-12 ENCOUNTER — Encounter: Payer: Self-pay | Admitting: Internal Medicine

## 2012-09-12 ENCOUNTER — Other Ambulatory Visit (HOSPITAL_BASED_OUTPATIENT_CLINIC_OR_DEPARTMENT_OTHER): Payer: Commercial Managed Care - PPO | Admitting: Lab

## 2012-09-12 ENCOUNTER — Ambulatory Visit (HOSPITAL_BASED_OUTPATIENT_CLINIC_OR_DEPARTMENT_OTHER): Payer: Commercial Managed Care - PPO

## 2012-09-12 ENCOUNTER — Other Ambulatory Visit: Payer: Commercial Managed Care - PPO | Admitting: Lab

## 2012-09-12 VITALS — BP 121/72 | HR 73 | Temp 98.6°F | Resp 18 | Ht 63.0 in | Wt 213.0 lb

## 2012-09-12 DIAGNOSIS — D638 Anemia in other chronic diseases classified elsewhere: Secondary | ICD-10-CM

## 2012-09-12 DIAGNOSIS — D649 Anemia, unspecified: Secondary | ICD-10-CM

## 2012-09-12 DIAGNOSIS — D3A00 Benign carcinoid tumor of unspecified site: Secondary | ICD-10-CM

## 2012-09-12 LAB — CBC WITH DIFFERENTIAL/PLATELET
BASO%: 0 % (ref 0.0–2.0)
EOS%: 2.3 % (ref 0.0–7.0)
HCT: 24.5 % — ABNORMAL LOW (ref 34.8–46.6)
MCH: 31.9 pg (ref 25.1–34.0)
MCHC: 33.5 g/dL (ref 31.5–36.0)
MONO#: 0.1 10*3/uL (ref 0.1–0.9)
NEUT%: 30.8 % — ABNORMAL LOW (ref 38.4–76.8)
RBC: 2.57 10*6/uL — ABNORMAL LOW (ref 3.70–5.45)
RDW: 15.6 % — ABNORMAL HIGH (ref 11.2–14.5)
WBC: 2.6 10*3/uL — ABNORMAL LOW (ref 3.9–10.3)
lymph#: 1.7 10*3/uL (ref 0.9–3.3)
nRBC: 0 % (ref 0–0)

## 2012-09-12 MED ORDER — DARBEPOETIN ALFA-POLYSORBATE 300 MCG/0.6ML IJ SOLN
300.0000 ug | Freq: Once | INTRAMUSCULAR | Status: AC
  Administered 2012-09-12: 300 ug via SUBCUTANEOUS
  Filled 2012-09-12: qty 0.6

## 2012-09-12 NOTE — Telephone Encounter (Signed)
printed avs for pt...gv referral to [name]. Pt aware [name] will call to sched appt

## 2012-09-12 NOTE — Telephone Encounter (Signed)
Pt appt. With Dr. Lowell Guitar @ [facility] is 09/27/12@1 :00. Medical records faxed, slides will be fedex'ed. Left pt vm to return call

## 2012-09-12 NOTE — Progress Notes (Signed)
[facility] [phone]   Fax:([phone]  OFFICE PROGRESS NOTE  Ann Rua, MD [address] [place] [address]  DIAGNOSIS: Persistent anemia questionable for anemia of chronic disease   PRIOR THERAPY: None   CURRENT THERAPY: Aranesp 300 mcg subcutaneously every 3 weeks   INTERVAL HISTORY: [name] 57 y.o. female returns to the clinic today for follow up visit accompanied her husband. The patient continues to complain of fatigue and weakness. She is currently undergoing treatment with Aranesp 300 mcg subcutaneously every 3 weeks but continues to have persistent anemia.she was complaining of nausea and vomiting as well as diarrhea recently. She was seen by Dr. Fayrene Fearing and we discussed consideration of octreotide scan for evaluation of her history of carcinoid tumor. The patient had several studies in the past that did not reveal etiology for her persistent pancytopenia. She is currently on azathioprine for rheumatologic disorders which could be contributing o her current pancytopenia. She denied having any other significant complaints today specifically no chest pain or shortness of breath.  MEDICAL HISTORY: Past Medical History  Diagnosis Date  . Hypertension   . Gout   . GERD (gastroesophageal reflux disease)   . History of kidney cancer   . Autoimmune hepatitis   . Rheumatoid arthritis(714.0)   . Anemia   . PONV (postoperative nausea and vomiting)   . Shortness of breath     exertion  . Cancer 2006    renal cancer    ALLERGIES:  is allergic to aspirin; lisinopril; methotrexate derivatives; morphine and related; other; penicillins; aloe; lactose intolerance (gi); latex; and prozac.  MEDICATIONS:  Current Outpatient Prescriptions  Medication Sig Dispense Refill  . allopurinol (ZYLOPRIM) 300 MG tablet Take 300 mg by mouth daily.      . Ascorbic Acid (VITAMIN C PO) Take by mouth.      . azaTHIOprine (IMURAN) 50 MG  tablet Take 50 mg by mouth daily. 2 in the morning and 1 in the evening      . Carboxymethylcellulose Sodium (THERATEARS OP) Apply 1 drop to eye every morning.      . cyclobenzaprine (FLEXERIL) 10 MG tablet Take 10 mg by mouth at bedtime.       Marland Kitchen esomeprazole (NEXIUM) 40 MG capsule Take 40 mg by mouth 2 (two) times daily.      . furosemide (LASIX) 20 MG tablet Take 20 mg by mouth 2 (two) times daily.      Marland Kitchen losartan-hydrochlorothiazide (HYZAAR) 100-25 MG per tablet Take 1 tablet by mouth daily.      . Multiple Vitamins-Minerals (ZINC PO) Take by mouth.      . nystatin cream (MYCOSTATIN) Apply 1 application topically 2 (two) times daily.      . ondansetron (ZOFRAN) 4 MG tablet Take 4 mg by mouth every 6 (six) hours as needed for nausea.      . potassium chloride SA (K-DUR,KLOR-CON) 20 MEQ tablet Take 20 mEq by mouth 2 (two) times daily.      . sertraline (ZOLOFT) 50 MG tablet Take 50 mg by mouth daily.      . traMADol (ULTRAM) 50 MG tablet Take 50 mg by mouth every 6 (six) hours as needed for pain.      Marland Kitchen VITAMIN A PO Take by mouth.       No current facility-administered medications for this visit.    SURGICAL HISTORY:  Past Surgical History  Procedure Laterality Date  .  Abdominal hysterectomy    . Cholecystectomy, laparoscopic    . Partial nephrectomy    . Eus N/A 04/17/2012    Procedure: ESOPHAGEAL ENDOSCOPIC ULTRASOUND (EUS) RADIAL;  Surgeon: Willis Modena, MD;  Location: [facility];  Service: Endoscopy;  Laterality: N/A;  . Tonsillectomy and adenoidectomy    . Breast biopsy      "several"  . Cholecystectomy    . Wisdom tooth extraction    . Finger surgery      mass removed  . Gastrectomy N/A 05/08/2012    Procedure: Excision gastric mass, possible partial gastrectomy;  Surgeon: Shelly Rubenstein, MD;  Location: [facility] OR;  Service: General;  Laterality: N/A;    REVIEW OF SYSTEMS:  A comprehensive review of systems was negative except for: Constitutional: positive for fatigue    PHYSICAL EXAMINATION: General appearance: alert, cooperative, fatigued and no distress Head: Normocephalic, without obvious abnormality, atraumatic Neck: no adenopathy Lymph nodes: Cervical, supraclavicular, and axillary nodes normal. Resp: clear to auscultation bilaterally Cardio: regular rate and rhythm, S1, S2 normal, no murmur, click, rub or gallop GI: soft, non-tender; bowel sounds normal; no masses,  no organomegaly Extremities: extremities normal, atraumatic, no cyanosis or edema  ECOG PERFORMANCE STATUS: 1 - Symptomatic but completely ambulatory  Blood pressure 121/72, pulse 73, temperature 98.6 F (37 C), temperature source Oral, resp. rate 18, height 5\' 3"  (1.6 m), weight 213 lb (96.616 kg).  LABORATORY DATA: Lab Results  Component Value Date   WBC 2.6 Repeated and Verified* 09/12/2012   HGB 8.2 Repeated and Verified* 09/12/2012   HCT 24.5* 09/12/2012   MCV 95.3 09/12/2012   PLT 143 confirmed both analyzers* 09/12/2012      Chemistry      Component Value Date/Time   NA 139 08/13/2012 1452   NA 134* 05/17/2012 0535   K 3.1* 08/13/2012 1452   K 3.2* 05/17/2012 0535   CL 95* 05/17/2012 0535   CL 106 04/23/2012 1310   CO2 29 08/13/2012 1452   CO2 32 05/17/2012 0535   BUN 9.2 08/13/2012 1452   BUN 5* 05/17/2012 0535   CREATININE 0.8 08/13/2012 1452   CREATININE 0.83 05/17/2012 0535      Component Value Date/Time   CALCIUM 10.0 08/13/2012 1452   CALCIUM 8.7 05/17/2012 0535   ALKPHOS 175* 08/13/2012 1452   ALKPHOS 164* 05/08/2012 1000   AST 27 08/13/2012 1452   AST 37 05/08/2012 1000   ALT 24 08/13/2012 1452   ALT 43* 05/08/2012 1000   BILITOT 0.45 08/13/2012 1452   BILITOT 0.6 05/08/2012 1000       RADIOGRAPHIC STUDIES: No results found.  ASSESSMENT AND PLAN: this is a very pleasant 57 years old white female with persistent pancytopenia of unclear achalasia at this point. The patient had several studies in the past of whether unremarkable including a bone marrow biopsy and aspirate. Her  pancytopenia could be drug induced but other etiology cannot be excluded at this point. I have a lengthy discussion with the patient today about her condition. I recommended for her to see Dr. Lowell Guitar at [facility] for second opinion regarding her persistent pancytopenia.  I would see her back for follow up visit after her evaluation at [facility], and she was advised to call for the appointment. She will continue on Aranesp injection for now. She was advised to call immediately if she has any concerning symptoms in the interval.  The patient voices understanding of current disease status and treatment options and is in agreement  with the current care plan.  All questions were answered. The patient knows to call the clinic with any problems, questions or concerns. We can certainly see the patient much sooner if necessary.

## 2012-09-16 ENCOUNTER — Encounter (HOSPITAL_COMMUNITY)
Admission: RE | Admit: 2012-09-16 | Discharge: 2012-09-16 | Disposition: A | Discharge status: Home / Self Care | Payer: Commercial Managed Care - PPO | Source: Ambulatory Visit | Attending: Gastroenterology | Admitting: Gastroenterology

## 2012-09-16 DIAGNOSIS — D3A00 Benign carcinoid tumor of unspecified site: Secondary | ICD-10-CM | POA: Insufficient documentation

## 2012-09-16 MED ORDER — INDIUM IN-111 PENTETREOTIDE IV KIT
6.0000 | PACK | Freq: Once | INTRAVENOUS | Status: AC | PRN
  Administered 2012-09-16: 6 via INTRAVENOUS

## 2012-09-17 ENCOUNTER — Encounter (HOSPITAL_COMMUNITY)
Admission: RE | Admit: 2012-09-17 | Discharge: 2012-09-17 | Disposition: A | Discharge status: Home / Self Care | Payer: Commercial Managed Care - PPO | Source: Ambulatory Visit | Attending: Gastroenterology | Admitting: Gastroenterology

## 2012-09-18 ENCOUNTER — Encounter (HOSPITAL_COMMUNITY): Payer: Commercial Managed Care - PPO

## 2012-09-24 ENCOUNTER — Telehealth: Payer: Self-pay | Admitting: Medical Oncology

## 2012-09-24 ENCOUNTER — Encounter (HOSPITAL_COMMUNITY)
Admission: RE | Admit: 2012-09-24 | Discharge: 2012-09-24 | Disposition: A | Discharge status: Home / Self Care | Payer: Commercial Managed Care - PPO | Source: Ambulatory Visit | Attending: Internal Medicine | Admitting: Internal Medicine

## 2012-09-24 ENCOUNTER — Telehealth: Payer: Self-pay | Admitting: Internal Medicine

## 2012-09-24 ENCOUNTER — Other Ambulatory Visit: Payer: Commercial Managed Care - PPO

## 2012-09-24 ENCOUNTER — Telehealth: Payer: Self-pay | Admitting: *Deleted

## 2012-09-24 ENCOUNTER — Other Ambulatory Visit: Payer: Self-pay | Admitting: Medical Oncology

## 2012-09-24 DIAGNOSIS — D649 Anemia, unspecified: Secondary | ICD-10-CM

## 2012-09-24 LAB — PREPARE RBC (CROSSMATCH)

## 2012-09-24 NOTE — Progress Notes (Signed)
Called for HAR. 

## 2012-09-24 NOTE — Telephone Encounter (Signed)
I spoke to pt about her hgb. She is scheduled to see Dr Lowell Guitar at [facility] on Friday.

## 2012-09-24 NOTE — Telephone Encounter (Signed)
Gave pt for August and September 2014,lab, Md and Blood transfusion

## 2012-09-24 NOTE — Telephone Encounter (Signed)
Per staff message and POF I have scheduled appts.  JMW  

## 2012-09-25 ENCOUNTER — Other Ambulatory Visit: Payer: Self-pay | Admitting: Medical Oncology

## 2012-09-26 ENCOUNTER — Ambulatory Visit (HOSPITAL_BASED_OUTPATIENT_CLINIC_OR_DEPARTMENT_OTHER): Payer: Commercial Managed Care - PPO

## 2012-09-26 VITALS — BP 120/58 | HR 78 | Temp 98.9°F | Resp 18

## 2012-09-26 DIAGNOSIS — D649 Anemia, unspecified: Secondary | ICD-10-CM

## 2012-09-26 MED ORDER — DIPHENHYDRAMINE HCL 50 MG/ML IJ SOLN
25.0000 mg | Freq: Once | INTRAMUSCULAR | Status: AC
  Administered 2012-09-26: 25 mg via INTRAVENOUS

## 2012-09-26 MED ORDER — SODIUM CHLORIDE 0.9 % IV SOLN
250.0000 mL | Freq: Once | INTRAVENOUS | Status: AC
  Administered 2012-09-26: 250 mL via INTRAVENOUS

## 2012-09-26 MED ORDER — ACETAMINOPHEN 325 MG PO TABS
650.0000 mg | ORAL_TABLET | Freq: Once | ORAL | Status: AC
  Administered 2012-09-26: 650 mg via ORAL

## 2012-09-26 NOTE — Patient Instructions (Signed)
Blood Transfusion  A blood transfusion replaces your blood or some of its parts. Blood is replaced when you have lost blood because of surgery, an accident, or for severe blood conditions like anemia. You can donate blood to be used on yourself if you have a planned surgery. If you lose blood during that surgery, your own blood can be given back to you. Any blood given to you is checked to make sure it matches your blood type. Your temperature, blood pressure, and heart rate (vital signs) will be checked often.  GET HELP RIGHT AWAY IF:   You feel sick to your stomach (nauseous) or throw up (vomit).  You have watery poop (diarrhea).  You have shortness of breath or trouble breathing.  You have blood in your pee (urine) or have dark colored pee.  You have chest pain or tightness.  Your eyes or skin turn yellow (jaundice).  You have a temperature by mouth above 102 F (38.9 C), not controlled by medicine.  You start to shake and have chills.  You develop a a red rash (hives) or feel itchy.  You develop lightheadedness or feel confused.  You develop back, joint, or muscle pain.  You do not feel hungry (lost appetite).  You feel tired, restless, or nervous.  You develop belly (abdominal) cramps. Document Released: 04/28/2008 Document Revised: 04/24/2011 Document Reviewed: 04/28/2008 [employer] Patient Information 2014 [employer].  

## 2012-09-27 LAB — TYPE AND SCREEN
Donor AG Type: NEGATIVE
PT AG Type: NEGATIVE

## 2012-09-30 ENCOUNTER — Telehealth: Payer: Self-pay | Admitting: Internal Medicine

## 2012-09-30 ENCOUNTER — Telehealth: Payer: Self-pay | Admitting: *Deleted

## 2012-09-30 NOTE — Telephone Encounter (Signed)
pt husband wanted to cx not r/slab and inj

## 2012-09-30 NOTE — Telephone Encounter (Signed)
Pt's husband called stating that Dr Roanna Banning office at [facility] is requesting slides to be sent to his office from the [facility] that was done in June 2014.  Spoke to pathology, informed Dr Roanna Banning office that they need to fax in a request with the fedex #, which slides they want, pt info and fax to [phone].  Pt is aware that this is being coordinated.  SLJ

## 2012-10-03 ENCOUNTER — Ambulatory Visit: Payer: Commercial Managed Care - PPO

## 2012-10-03 ENCOUNTER — Other Ambulatory Visit: Payer: Commercial Managed Care - PPO | Admitting: Lab

## 2012-10-10 ENCOUNTER — Encounter (INDEPENDENT_AMBULATORY_CARE_PROVIDER_SITE_OTHER): Payer: Self-pay | Admitting: General Surgery

## 2012-10-10 ENCOUNTER — Ambulatory Visit (INDEPENDENT_AMBULATORY_CARE_PROVIDER_SITE_OTHER): Payer: Commercial Managed Care - PPO | Admitting: General Surgery

## 2012-10-10 ENCOUNTER — Other Ambulatory Visit (INDEPENDENT_AMBULATORY_CARE_PROVIDER_SITE_OTHER): Payer: Self-pay | Admitting: General Surgery

## 2012-10-10 VITALS — BP 130/78 | HR 96 | Temp 98.9°F | Resp 15 | Ht 62.0 in | Wt 206.8 lb

## 2012-10-10 DIAGNOSIS — K469 Unspecified abdominal hernia without obstruction or gangrene: Secondary | ICD-10-CM

## 2012-10-10 DIAGNOSIS — R1013 Epigastric pain: Secondary | ICD-10-CM

## 2012-10-10 DIAGNOSIS — R109 Unspecified abdominal pain: Secondary | ICD-10-CM

## 2012-10-10 NOTE — Progress Notes (Signed)
Subjective:     Patient ID: [name], female   DOB: [birth year], 56 y.o.   MRN: [id number]  HPI S/p partial gastrectomy in 3/14 by Dr. Magnus Ivan.  This was complicated with a postop wound infection.  She has been doing well until about 3 days ago when she began having epigastric tenderness at the upper portion of her incision.  She is not having symptoms with rest but she does have discomfort in the area when walking and with movement.  No fevers or chills or nausea and vomiting.  Bowels okay.  She has had some nausea in the past but this has resolved with stopping her vitamins.  She has not noticed a bulge in the area but she was told by her doctor that she might have a hernia.  Review of Systems     Objective:   Physical Exam NAD, Nontoxic Her upper midline wound is healed with a wide scar. She is tender to palpation at the upper aspect of her incision.  I think that she does have a palpable bulge which is reducible just to the right of her upper midline scar consistent with a reducible incisional hernia    Assessment:     Abdominal pain, likely due to incisional hernia I think that her exam and history are consistent with incisional hernia.  She certainly has risk factors for this with her recent surgery, obesity, history of immune suppressants, and wound infection.  I have recommended CT abdomen to further evaluate for the cause of her pain and to evaluate for ventral hernia.  I also discussed the option for binder for comfort.  Her primary physician had given her some pain medications.     Plan:     CT abdomen and follow up with Dr. Magnus Ivan after her scan.  She mentioned that she may want to have follow up at [facility] since she is in the process of leukemia workup there.  I recommended that she schedule follow up with her surgeon here to follow up her CT scan results.

## 2012-10-11 ENCOUNTER — Other Ambulatory Visit (INDEPENDENT_AMBULATORY_CARE_PROVIDER_SITE_OTHER): Payer: Self-pay | Admitting: General Surgery

## 2012-10-11 ENCOUNTER — Telehealth (INDEPENDENT_AMBULATORY_CARE_PROVIDER_SITE_OTHER): Payer: Self-pay | Admitting: General Surgery

## 2012-10-11 ENCOUNTER — Ambulatory Visit
Admission: RE | Admit: 2012-10-11 | Discharge: 2012-10-11 | Disposition: A | Discharge status: Home / Self Care | Payer: Commercial Managed Care - PPO | Source: Ambulatory Visit | Attending: General Surgery | Admitting: General Surgery

## 2012-10-11 DIAGNOSIS — R109 Unspecified abdominal pain: Secondary | ICD-10-CM

## 2012-10-11 DIAGNOSIS — K469 Unspecified abdominal hernia without obstruction or gangrene: Secondary | ICD-10-CM

## 2012-10-11 MED ORDER — SULFAMETHOXAZOLE-TRIMETHOPRIM 400-80 MG PO TABS: 1.0000 | ORAL_TABLET | Freq: Two times a day (BID) | ORAL | Status: AC

## 2012-10-11 MED ORDER — IOHEXOL 300 MG/ML  SOLN
125.0000 mL | Freq: Once | INTRAMUSCULAR | Status: AC | PRN
  Administered 2012-10-11: 125 mL via INTRAVENOUS

## 2012-10-11 NOTE — Telephone Encounter (Signed)
I received a phone call from the radiologist about the CT scan for [name].  She appears to have a 3cm fluid collection in the area of concern.  She says that she is about the same from yesterday.  She is not having fevers or chills.  There is no erythema or skin changes.  No Ct for comparison and this could be residual seroma from her surgery vs abscess.  We will try antibiotics and see if any change in her symptoms.  I recommended that she come into the ER if any increase in her pain, redness or fevers or chills.  Antibiotics sent to her pharmacy.

## 2012-10-12 DIAGNOSIS — C7A092 Malignant carcinoid tumor of the stomach: Secondary | ICD-10-CM | POA: Insufficient documentation

## 2012-10-12 DIAGNOSIS — Z8739 Personal history of other diseases of the musculoskeletal system and connective tissue: Secondary | ICD-10-CM | POA: Insufficient documentation

## 2012-10-12 DIAGNOSIS — C649 Malignant neoplasm of unspecified kidney, except renal pelvis: Secondary | ICD-10-CM | POA: Insufficient documentation

## 2012-10-12 DIAGNOSIS — K754 Autoimmune hepatitis: Secondary | ICD-10-CM | POA: Insufficient documentation

## 2012-10-13 DIAGNOSIS — L0291 Cutaneous abscess, unspecified: Secondary | ICD-10-CM | POA: Insufficient documentation

## 2012-10-13 DIAGNOSIS — R109 Unspecified abdominal pain: Secondary | ICD-10-CM | POA: Insufficient documentation

## 2012-10-15 DIAGNOSIS — R768 Other specified abnormal immunological findings in serum: Secondary | ICD-10-CM | POA: Insufficient documentation

## 2012-10-16 ENCOUNTER — Ambulatory Visit (INDEPENDENT_AMBULATORY_CARE_PROVIDER_SITE_OTHER): Payer: Commercial Managed Care - PPO | Admitting: Surgery

## 2012-10-16 ENCOUNTER — Encounter (INDEPENDENT_AMBULATORY_CARE_PROVIDER_SITE_OTHER): Payer: Self-pay | Admitting: Surgery

## 2012-10-16 ENCOUNTER — Other Ambulatory Visit (INDEPENDENT_AMBULATORY_CARE_PROVIDER_SITE_OTHER): Payer: Self-pay

## 2012-10-16 ENCOUNTER — Other Ambulatory Visit (INDEPENDENT_AMBULATORY_CARE_PROVIDER_SITE_OTHER): Payer: Self-pay | Admitting: Surgery

## 2012-10-16 VITALS — BP 112/68 | HR 84 | Temp 98.9°F | Resp 14 | Ht 62.0 in | Wt 213.2 lb

## 2012-10-16 DIAGNOSIS — IMO0001 Reserved for inherently not codable concepts without codable children: Secondary | ICD-10-CM

## 2012-10-16 NOTE — Progress Notes (Signed)
This patient here last week and evaluated because of some pain in the epigastric area of her incision. A CT scan was done which showed a fluid collection and she was scheduled for followup by Dr. Rayburn Ma tomorrow. However, this got more and more painful and she came to the urgent office today. She apparently has been seen between a CT scan and today at [facility] but they do not think infection was present. However she's gotten more pain and redness and tenderness in the area.  On exam she does have an area of induration in the epigastric portion of her incision and exquisite tenderness. There is also what appears to be some edema fluid developing in the scar. It's not clearly fluctuant.  I reviewed the CT scan and this appeared to be some form of collection in this area to the right of the midline. There is a small incisional hernia at the lower end of the incision but this does not appear to be causing a problem.  Impression: I think she is developing a recurrent infection  Plan: I discussed the situation with her and told her  need to try I&D today.she was agreeable. I anesthetized the area with 1% Xylocaine with epinephrine after getting a prepped with ChloraPrep. I tried aspirating the area with 18-gauge needle but didn't get any fluid. I then made a 3 cm incision in the prior scar and deepened it somewhat and there was a cavity that I could put a hemostat into the didn't really contain any purulent material. I couldn't really do anything deeper because of concerns about pain and also was arrived at getting into or through fascia. I stayed fairly superficial.  At this point I got a culture intact with some mild form. She will continue the antibiotics she was started on a [facility] and follow up with Dr. Magnus Ivan tomorrow. She may need either another CT scan or perhaps it will need to be opened in the operating room. Hopefully however, opening it and the antibiotics today will improve her  situation.

## 2012-10-16 NOTE — Patient Instructions (Signed)
Change outside dressing if needed Continue your antibiotic See Dr Magnus Ivan tomorrow

## 2012-10-17 ENCOUNTER — Encounter (INDEPENDENT_AMBULATORY_CARE_PROVIDER_SITE_OTHER): Payer: Self-pay | Admitting: Surgery

## 2012-10-17 ENCOUNTER — Ambulatory Visit (INDEPENDENT_AMBULATORY_CARE_PROVIDER_SITE_OTHER): Payer: Commercial Managed Care - PPO | Admitting: Surgery

## 2012-10-17 VITALS — BP 127/77 | HR 68 | Temp 99.9°F | Resp 18 | Ht 62.0 in | Wt 209.6 lb

## 2012-10-17 DIAGNOSIS — R109 Unspecified abdominal pain: Secondary | ICD-10-CM

## 2012-10-17 DIAGNOSIS — IMO0001 Reserved for inherently not codable concepts without codable children: Secondary | ICD-10-CM

## 2012-10-17 NOTE — Progress Notes (Signed)
Subjective:     Patient ID: [name], female   DOB: October 23, 1955, 57 y.o.   MRN: [id number]  HPI She is back today and still having abdominal pain  Review of Systems     Objective:   Physical Exam As soon as she laid back on the examining table and I pulled the packing a large amount of purulence came out. I anesthetize her skin and made incision larger and found a large abscess cavity. I then washed out with saline and repacked it    Assessment:     Postoperatively wound infection     Plan:     She will start doing wet to dry dressing changes twice a day and I will see her back tomorrow. If this is not improving, she will need admission to the hospital and a washout of the wound in the operating room

## 2012-10-18 ENCOUNTER — Ambulatory Visit (INDEPENDENT_AMBULATORY_CARE_PROVIDER_SITE_OTHER): Payer: Commercial Managed Care - PPO | Admitting: Surgery

## 2012-10-18 ENCOUNTER — Telehealth: Payer: Self-pay | Admitting: Internal Medicine

## 2012-10-18 ENCOUNTER — Encounter (INDEPENDENT_AMBULATORY_CARE_PROVIDER_SITE_OTHER): Payer: Self-pay | Admitting: Surgery

## 2012-10-18 ENCOUNTER — Telehealth (INDEPENDENT_AMBULATORY_CARE_PROVIDER_SITE_OTHER): Payer: Self-pay | Admitting: *Deleted

## 2012-10-18 ENCOUNTER — Encounter (INDEPENDENT_AMBULATORY_CARE_PROVIDER_SITE_OTHER): Payer: Self-pay | Admitting: *Deleted

## 2012-10-18 VITALS — BP 130/84 | HR 80 | Temp 97.9°F | Resp 15 | Ht 62.0 in | Wt 211.2 lb

## 2012-10-18 DIAGNOSIS — IMO0001 Reserved for inherently not codable concepts without codable children: Secondary | ICD-10-CM

## 2012-10-18 DIAGNOSIS — Z5189 Encounter for other specified aftercare: Secondary | ICD-10-CM

## 2012-10-18 NOTE — Telephone Encounter (Signed)
Husband called to request a return to work note be sent for the patient to return to work on Monday.  He asks for it to be sent to Attn: Jasmine December [phone].  Awaiting Dr. Eliberto Ivory approve at this time.

## 2012-10-18 NOTE — Telephone Encounter (Signed)
pt husband call and confirmed that pt has transferred care to [facility] and no longer needed appts at [facility]

## 2012-10-18 NOTE — Progress Notes (Signed)
Subjective:     Patient ID: [name], female   DOB: [birth year], 57 y.o.   MRN: [id number]  HPI She is here for a followup of her abscess drainage yesterday. She has much less discomfort  Review of Systems     Objective:   Physical Exam On exam, the wound is clean. I elected to open up a second area in the lower portion of the incision to allow the packing to be placed easier. I anesthetize her skin with lidocaine and an incision with a scalpel after prepping with Betadine. This allowed me to enter the open cavity. I then packed both wounds with gauze    Assessment:     Postop wound infection     Plan:     She will continue the wet-to-dry dressing changes and her antibiotics. I will see her back in 10 days

## 2012-10-18 NOTE — Telephone Encounter (Signed)
Spoke with Dr. Magnus Ivan who did approve patient to return to work on 10/21/12 with no restrictions.  Letter completed at this time and faxed to [facility].  Confirmation received that fax OK.

## 2012-10-19 LAB — WOUND CULTURE
Gram Stain: NONE SEEN
Gram Stain: NONE SEEN
Organism ID, Bacteria: NO GROWTH

## 2012-10-24 ENCOUNTER — Other Ambulatory Visit: Payer: Commercial Managed Care - PPO | Admitting: Lab

## 2012-10-24 ENCOUNTER — Ambulatory Visit: Payer: Commercial Managed Care - PPO

## 2012-10-24 ENCOUNTER — Ambulatory Visit: Payer: Commercial Managed Care - PPO | Admitting: Internal Medicine

## 2012-10-28 ENCOUNTER — Encounter (INDEPENDENT_AMBULATORY_CARE_PROVIDER_SITE_OTHER): Payer: Self-pay | Admitting: Surgery

## 2012-10-28 ENCOUNTER — Ambulatory Visit (INDEPENDENT_AMBULATORY_CARE_PROVIDER_SITE_OTHER): Payer: Commercial Managed Care - PPO | Admitting: Surgery

## 2012-10-28 VITALS — BP 130/76 | HR 78 | Temp 98.1°F | Resp 14 | Ht 62.0 in | Wt 203.0 lb

## 2012-10-28 DIAGNOSIS — T8189XD Other complications of procedures, not elsewhere classified, subsequent encounter: Secondary | ICD-10-CM

## 2012-10-28 DIAGNOSIS — Z5189 Encounter for other specified aftercare: Secondary | ICD-10-CM

## 2012-10-28 NOTE — Progress Notes (Signed)
Subjective:     Patient ID: [name], female   DOB: 01-27-1956, 57 y.o.   MRN: [id number]  HPI She is here for another wound check. She is doing wet to dry dressing changes twice daily. She has no fevers and no complaints. She finished her antibiotics last week  Review of Systems     Objective:   Physical Exam On exam, the wounds looked great. There is good granulation tissue and no erythema or purulence    Assessment:     Nonhealing surgical wound, improving     Plan:     They will continue the current wound care but doing only once daily and I'll see her back in 3 weeks

## 2012-11-14 ENCOUNTER — Other Ambulatory Visit: Payer: Commercial Managed Care - PPO | Admitting: Lab

## 2012-11-14 ENCOUNTER — Ambulatory Visit: Payer: Commercial Managed Care - PPO

## 2012-11-20 ENCOUNTER — Ambulatory Visit (INDEPENDENT_AMBULATORY_CARE_PROVIDER_SITE_OTHER): Payer: Commercial Managed Care - PPO | Admitting: Surgery

## 2012-11-20 ENCOUNTER — Encounter (INDEPENDENT_AMBULATORY_CARE_PROVIDER_SITE_OTHER): Payer: Self-pay | Admitting: Surgery

## 2012-11-20 VITALS — BP 124/77 | HR 70 | Temp 97.4°F | Resp 16 | Ht 62.0 in | Wt 199.6 lb

## 2012-11-20 DIAGNOSIS — Z5189 Encounter for other specified aftercare: Secondary | ICD-10-CM

## 2012-11-20 DIAGNOSIS — T8189XD Other complications of procedures, not elsewhere classified, subsequent encounter: Secondary | ICD-10-CM

## 2012-11-20 NOTE — Progress Notes (Signed)
Subjective:     Patient ID: [name], female   DOB: May 05, 1955, 57 y.o.   MRN: [id number]  HPI She is here today for another wound check. She is undergoing daily wound packing. She reports a lower wound is almost healed. She has no drainage from the wounds and is feeling back to normal  Review of Systems     Objective:   Physical Exam On exam, the wounds are clean the midline incision. I treated them with silver nitrate     Assessment:     Nonhealing surgical wound     Plan:     They'll continue the current wound care. I'll see her back on October 27

## 2012-12-02 ENCOUNTER — Telehealth (INDEPENDENT_AMBULATORY_CARE_PROVIDER_SITE_OTHER): Payer: Self-pay

## 2012-12-02 NOTE — Telephone Encounter (Signed)
The patient's husband called reporting that the pt has pronounced swelling on the left side of her incision.  They are worried it is a hernia.  She has no signs of infection.  She is having pain.  She has an appointment 10/27 with Dr Magnus Ivan.  He is asking if she can come in this week one day around 5 pm.  She doesn't has much time off from work.  I told him I would have to send a message to [place].

## 2012-12-04 ENCOUNTER — Ambulatory Visit (INDEPENDENT_AMBULATORY_CARE_PROVIDER_SITE_OTHER): Payer: Commercial Managed Care - PPO | Admitting: Surgery

## 2012-12-04 ENCOUNTER — Encounter (INDEPENDENT_AMBULATORY_CARE_PROVIDER_SITE_OTHER): Payer: Self-pay | Admitting: Surgery

## 2012-12-04 VITALS — BP 125/77 | HR 77 | Temp 97.4°F | Resp 16 | Ht 62.0 in | Wt 197.2 lb

## 2012-12-04 DIAGNOSIS — K432 Incisional hernia without obstruction or gangrene: Secondary | ICD-10-CM

## 2012-12-04 NOTE — Progress Notes (Signed)
Subjective:     Patient ID: [name], female   DOB: 1955/06/16, 57 y.o.   MRN: [id number]  HPI She is here for another followup of her wound. She reports that it is almost closed and they can get a little packing in it. Unfortunately, she believes she has developed an incisional hernia  Review of Systems     Objective:   Physical Exam On exam, the wound is and is completely healed. There is an easily reducible incisional hernia    Assessment:     Nonhealing surgical wound Incisional hernia     Plan:     She will continue the current wound care. I am going to go ahead and schedule her tentatively for laparoscopic incisional hernia repair with mesh in November

## 2012-12-09 ENCOUNTER — Encounter (INDEPENDENT_AMBULATORY_CARE_PROVIDER_SITE_OTHER): Payer: Commercial Managed Care - PPO | Admitting: Surgery

## 2012-12-19 ENCOUNTER — Other Ambulatory Visit: Payer: Self-pay

## 2012-12-23 ENCOUNTER — Encounter (INDEPENDENT_AMBULATORY_CARE_PROVIDER_SITE_OTHER): Payer: Self-pay | Admitting: Surgery

## 2012-12-23 ENCOUNTER — Ambulatory Visit (INDEPENDENT_AMBULATORY_CARE_PROVIDER_SITE_OTHER): Payer: Commercial Managed Care - PPO | Admitting: Surgery

## 2012-12-23 VITALS — BP 124/76 | HR 72 | Temp 99.2°F | Resp 15 | Ht 62.0 in | Wt 198.6 lb

## 2012-12-23 DIAGNOSIS — T8189XA Other complications of procedures, not elsewhere classified, initial encounter: Secondary | ICD-10-CM

## 2012-12-23 DIAGNOSIS — T8189XD Other complications of procedures, not elsewhere classified, subsequent encounter: Secondary | ICD-10-CM

## 2012-12-23 DIAGNOSIS — Z5189 Encounter for other specified aftercare: Secondary | ICD-10-CM

## 2012-12-23 NOTE — Progress Notes (Signed)
Subjective:     Patient ID: [name], female   DOB: 1955/03/13, 57 y.o.   MRN: [id number]  HPI She is doing well and reports of the wound is completely healed  Review of Systems     Objective:   Physical Exam On exam, the wound is completely closed. There is an incisional hernia    Assessment:     Nonhealing wound now completely healed.     Plan:     We will now proceed with laparoscopic incisional hernia repair with mesh on November 21

## 2012-12-25 ENCOUNTER — Encounter (HOSPITAL_COMMUNITY): Payer: Self-pay | Admitting: Pharmacy Technician

## 2012-12-25 ENCOUNTER — Other Ambulatory Visit (HOSPITAL_COMMUNITY): Payer: Self-pay | Admitting: *Deleted

## 2012-12-25 NOTE — Patient Instructions (Addendum)
[name]  12/25/2012                           YOUR PROCEDURE IS SCHEDULED ON:  01/03/13               PLEASE REPORT TO SHORT STAY CENTER AT :  8:30 AM               CALL THIS NUMBER IF ANY PROBLEMS THE DAY OF SURGERY :               [phone]                      REMEMBER:   Do not eat food or drink liquids AFTER MIDNIGHT  Take these medicines the morning of surgery with A SIP OF WATER:  nexium / sertraline / may take xanax or oxycodone if needed   Do not wear jewelry, make-up   Do not wear lotions, powders, or perfumes.   Do not shave legs or underarms 12 hrs. before surgery (men may shave face)  Do not bring valuables to the hospital.  Contacts, dentures or bridgework may not be worn into surgery.  Leave suitcase in the car. After surgery it may be brought to your room.  For patients admitted to the hospital more than one night, checkout time is 11:00                          The day of discharge.   Patients discharged the day of surgery will not be allowed to drive home                             If going home same day of surgery, must have someone stay with you first                           24 hrs at home and arrange for some one to drive you home from hospital.    Special Instructions:   Please read over the following fact sheets that you were given:                                  1. [facility] PREPARING FOR SURGERY SHEET               2. Discontinue aspirin / herbal meds 5 days preop                                                X_____________________________________________________________________        Failure to follow these instructions may result in cancellation of your surgery

## 2012-12-27 ENCOUNTER — Encounter (HOSPITAL_COMMUNITY): Payer: Self-pay

## 2012-12-27 ENCOUNTER — Encounter (HOSPITAL_COMMUNITY)
Admission: RE | Admit: 2012-12-27 | Discharge: 2012-12-27 | Disposition: A | Discharge status: Home / Self Care | Payer: Commercial Managed Care - PPO | Source: Ambulatory Visit | Attending: Surgery | Admitting: Surgery

## 2012-12-27 DIAGNOSIS — Z01812 Encounter for preprocedural laboratory examination: Secondary | ICD-10-CM | POA: Insufficient documentation

## 2012-12-27 HISTORY — DX: Major depressive disorder, single episode, unspecified: F32.9

## 2012-12-27 HISTORY — DX: Depression, unspecified: F32.A

## 2012-12-27 LAB — BASIC METABOLIC PANEL
BUN: 13 mg/dL (ref 6–23)
Calcium: 9.9 mg/dL (ref 8.4–10.5)
Creatinine, Ser: 0.83 mg/dL (ref 0.50–1.10)
GFR calc Af Amer: 90 mL/min — ABNORMAL LOW (ref >90)
GFR calc non Af Amer: 77 mL/min — ABNORMAL LOW (ref >90)

## 2012-12-27 LAB — CBC
MCHC: 34.3 g/dL (ref 30.0–36.0)
Platelets: 225 10*3/uL (ref 150–400)
RDW: 14 % (ref 11.5–15.5)
WBC: 7.8 10*3/uL (ref 4.0–10.5)

## 2012-12-27 NOTE — Progress Notes (Signed)
12/27/12 1429  OBSTRUCTIVE SLEEP APNEA  Have you ever been diagnosed with sleep apnea through a sleep study? No  Do you snore loudly (loud enough to be heard through closed doors)?  0  Do you often feel tired, fatigued, or sleepy during the daytime? 1  Has anyone observed you stop breathing during your sleep? 0  Do you have, or are you being treated for high blood pressure? 1  BMI more than 35 kg/m2? 1  Age over 56 years old? 1  Neck circumference greater than 40 cm/18 inches? 0  Gender: 0  Obstructive Sleep Apnea Score 4  Score 4 or greater  Results sent to PCP

## 2013-01-03 ENCOUNTER — Encounter (HOSPITAL_COMMUNITY)
Admission: RE | Disposition: A | Discharge status: Home / Self Care | Payer: Self-pay | Source: Ambulatory Visit | Attending: Surgery

## 2013-01-03 ENCOUNTER — Inpatient Hospital Stay (HOSPITAL_COMMUNITY)
Admission: RE | Admit: 2013-01-03 | Discharge: 2013-01-08 | DRG: 354 | Disposition: A | Discharge status: Home / Self Care | Payer: Commercial Managed Care - PPO | Source: Ambulatory Visit | Attending: Surgery | Admitting: Surgery

## 2013-01-03 ENCOUNTER — Encounter (HOSPITAL_COMMUNITY): Payer: Self-pay | Admitting: *Deleted

## 2013-01-03 ENCOUNTER — Encounter (HOSPITAL_COMMUNITY): Payer: Commercial Managed Care - PPO | Admitting: Certified Registered Nurse Anesthetist

## 2013-01-03 ENCOUNTER — Ambulatory Visit (HOSPITAL_COMMUNITY): Payer: Commercial Managed Care - PPO | Admitting: Certified Registered Nurse Anesthetist

## 2013-01-03 DIAGNOSIS — D62 Acute posthemorrhagic anemia: Secondary | ICD-10-CM | POA: Diagnosis not present

## 2013-01-03 DIAGNOSIS — F3289 Other specified depressive episodes: Secondary | ICD-10-CM | POA: Diagnosis present

## 2013-01-03 DIAGNOSIS — Z85528 Personal history of other malignant neoplasm of kidney: Secondary | ICD-10-CM

## 2013-01-03 DIAGNOSIS — K660 Peritoneal adhesions (postprocedural) (postinfection): Secondary | ICD-10-CM | POA: Diagnosis present

## 2013-01-03 DIAGNOSIS — I10 Essential (primary) hypertension: Secondary | ICD-10-CM | POA: Diagnosis present

## 2013-01-03 DIAGNOSIS — Z833 Family history of diabetes mellitus: Secondary | ICD-10-CM

## 2013-01-03 DIAGNOSIS — E669 Obesity, unspecified: Secondary | ICD-10-CM | POA: Diagnosis present

## 2013-01-03 DIAGNOSIS — K560 Paralytic ileus: Secondary | ICD-10-CM | POA: Diagnosis not present

## 2013-01-03 DIAGNOSIS — Z8249 Family history of ischemic heart disease and other diseases of the circulatory system: Secondary | ICD-10-CM

## 2013-01-03 DIAGNOSIS — K219 Gastro-esophageal reflux disease without esophagitis: Secondary | ICD-10-CM | POA: Diagnosis present

## 2013-01-03 DIAGNOSIS — K432 Incisional hernia without obstruction or gangrene: Secondary | ICD-10-CM

## 2013-01-03 DIAGNOSIS — Z01812 Encounter for preprocedural laboratory examination: Secondary | ICD-10-CM

## 2013-01-03 DIAGNOSIS — M109 Gout, unspecified: Secondary | ICD-10-CM | POA: Diagnosis present

## 2013-01-03 DIAGNOSIS — F329 Major depressive disorder, single episode, unspecified: Secondary | ICD-10-CM | POA: Diagnosis present

## 2013-01-03 DIAGNOSIS — M069 Rheumatoid arthritis, unspecified: Secondary | ICD-10-CM | POA: Diagnosis present

## 2013-01-03 DIAGNOSIS — R34 Anuria and oliguria: Secondary | ICD-10-CM | POA: Diagnosis present

## 2013-01-03 DIAGNOSIS — Z85028 Personal history of other malignant neoplasm of stomach: Secondary | ICD-10-CM

## 2013-01-03 DIAGNOSIS — R112 Nausea with vomiting, unspecified: Secondary | ICD-10-CM | POA: Diagnosis present

## 2013-01-03 HISTORY — PX: INCISIONAL HERNIA REPAIR: SHX193

## 2013-01-03 HISTORY — PX: INSERTION OF MESH: SHX5868

## 2013-01-03 SURGERY — REPAIR, HERNIA, INCISIONAL, LAPAROSCOPIC
Anesthesia: General | Site: Abdomen | Wound class: Clean

## 2013-01-03 MED ORDER — CIPROFLOXACIN IN D5W 400 MG/200ML IV SOLN
400.0000 mg | INTRAVENOUS | Status: AC
  Administered 2013-01-03: 400 mg via INTRAVENOUS

## 2013-01-03 MED ORDER — PROMETHAZINE HCL 25 MG/ML IJ SOLN: 6.2500 mg | INTRAMUSCULAR | Status: DC | PRN

## 2013-01-03 MED ORDER — CIPROFLOXACIN IN D5W 400 MG/200ML IV SOLN
INTRAVENOUS | Status: AC
  Filled 2013-01-03: qty 200

## 2013-01-03 MED ORDER — OXYCODONE-ACETAMINOPHEN 10-325 MG PO TABS: 1.0000 | ORAL_TABLET | ORAL | Status: DC | PRN

## 2013-01-03 MED ORDER — FUROSEMIDE 20 MG PO TABS
20.0000 mg | ORAL_TABLET | Freq: Two times a day (BID) | ORAL | Status: DC
  Administered 2013-01-03 – 2013-01-07 (×9): 20 mg via ORAL
  Filled 2013-01-03 (×12): qty 1

## 2013-01-03 MED ORDER — SUCCINYLCHOLINE CHLORIDE 20 MG/ML IJ SOLN
INTRAMUSCULAR | Status: AC
  Filled 2013-01-03: qty 1

## 2013-01-03 MED ORDER — PHENYLEPHRINE HCL 10 MG/ML IJ SOLN
INTRAMUSCULAR | Status: DC | PRN
  Administered 2013-01-03 (×3): 40 ug via INTRAVENOUS

## 2013-01-03 MED ORDER — HYDROMORPHONE HCL PF 2 MG/ML IJ SOLN
INTRAMUSCULAR | Status: AC
  Filled 2013-01-03: qty 1

## 2013-01-03 MED ORDER — FENTANYL CITRATE 0.05 MG/ML IJ SOLN
INTRAMUSCULAR | Status: AC
  Filled 2013-01-03: qty 5

## 2013-01-03 MED ORDER — ENOXAPARIN SODIUM 40 MG/0.4ML ~~LOC~~ SOLN
40.0000 mg | SUBCUTANEOUS | Status: DC
  Administered 2013-01-04: 40 mg via SUBCUTANEOUS
  Filled 2013-01-03 (×2): qty 0.4

## 2013-01-03 MED ORDER — ALPRAZOLAM 0.5 MG PO TABS
0.5000 mg | ORAL_TABLET | Freq: Two times a day (BID) | ORAL | Status: DC | PRN
  Administered 2013-01-07: 0.5 mg via ORAL
  Filled 2013-01-03 (×2): qty 1

## 2013-01-03 MED ORDER — LOSARTAN POTASSIUM 50 MG PO TABS
100.0000 mg | ORAL_TABLET | Freq: Every day | ORAL | Status: DC
  Administered 2013-01-04 – 2013-01-07 (×3): 100 mg via ORAL
  Filled 2013-01-03 (×5): qty 2

## 2013-01-03 MED ORDER — OXYCODONE-ACETAMINOPHEN 5-325 MG PO TABS
1.0000 | ORAL_TABLET | ORAL | Status: DC | PRN
  Administered 2013-01-05 – 2013-01-08 (×6): 1 via ORAL
  Filled 2013-01-03 (×6): qty 1

## 2013-01-03 MED ORDER — LIDOCAINE HCL (CARDIAC) 20 MG/ML IV SOLN
INTRAVENOUS | Status: AC
  Filled 2013-01-03: qty 5

## 2013-01-03 MED ORDER — FENTANYL CITRATE 0.05 MG/ML IJ SOLN
INTRAMUSCULAR | Status: DC | PRN
  Administered 2013-01-03: 50 ug via INTRAVENOUS
  Administered 2013-01-03 (×2): 100 ug via INTRAVENOUS

## 2013-01-03 MED ORDER — NEOSTIGMINE METHYLSULFATE 1 MG/ML IJ SOLN
INTRAMUSCULAR | Status: DC | PRN
  Administered 2013-01-03: 2 mg via INTRAVENOUS

## 2013-01-03 MED ORDER — ONDANSETRON HCL 4 MG/2ML IJ SOLN
4.0000 mg | Freq: Four times a day (QID) | INTRAMUSCULAR | Status: DC | PRN
  Administered 2013-01-03 – 2013-01-05 (×3): 4 mg via INTRAVENOUS
  Filled 2013-01-03 (×3): qty 2

## 2013-01-03 MED ORDER — FENTANYL CITRATE 0.05 MG/ML IJ SOLN
INTRAMUSCULAR | Status: AC
  Administered 2013-01-03: 14:00:00
  Filled 2013-01-03: qty 2

## 2013-01-03 MED ORDER — HYDROMORPHONE HCL PF 1 MG/ML IJ SOLN
INTRAMUSCULAR | Status: DC | PRN
  Administered 2013-01-03 (×2): 1 mg via INTRAVENOUS

## 2013-01-03 MED ORDER — LACTATED RINGERS IV SOLN
INTRAVENOUS | Status: DC
  Administered 2013-01-03: 1000 mL via INTRAVENOUS
  Administered 2013-01-03: 12:00:00 via INTRAVENOUS

## 2013-01-03 MED ORDER — BUPIVACAINE-EPINEPHRINE 0.25% -1:200000 IJ SOLN
INTRAMUSCULAR | Status: AC
  Filled 2013-01-03: qty 1

## 2013-01-03 MED ORDER — ROCURONIUM BROMIDE 100 MG/10ML IV SOLN
INTRAVENOUS | Status: AC
  Filled 2013-01-03: qty 1

## 2013-01-03 MED ORDER — PROPOFOL 10 MG/ML IV BOLUS
INTRAVENOUS | Status: DC | PRN
  Administered 2013-01-03: 200 mg via INTRAVENOUS

## 2013-01-03 MED ORDER — PANTOPRAZOLE SODIUM 40 MG PO TBEC
40.0000 mg | DELAYED_RELEASE_TABLET | Freq: Every day | ORAL | Status: DC
  Administered 2013-01-04 – 2013-01-07 (×4): 40 mg via ORAL
  Filled 2013-01-03 (×5): qty 1

## 2013-01-03 MED ORDER — HYDROCHLOROTHIAZIDE 25 MG PO TABS
25.0000 mg | ORAL_TABLET | Freq: Every day | ORAL | Status: DC
  Administered 2013-01-04 – 2013-01-07 (×4): 25 mg via ORAL
  Filled 2013-01-03 (×5): qty 1

## 2013-01-03 MED ORDER — LOSARTAN POTASSIUM-HCTZ 100-25 MG PO TABS
0.5000 | ORAL_TABLET | Freq: Every morning | ORAL | Status: DC
Start: 2013-01-03 — End: 2013-01-03

## 2013-01-03 MED ORDER — KETOROLAC TROMETHAMINE 30 MG/ML IJ SOLN
15.0000 mg | Freq: Once | INTRAMUSCULAR | Status: AC | PRN
  Administered 2013-01-03: 30 mg via INTRAVENOUS

## 2013-01-03 MED ORDER — LIDOCAINE HCL (CARDIAC) 20 MG/ML IV SOLN
INTRAVENOUS | Status: DC | PRN
  Administered 2013-01-03: 80 mg via INTRAVENOUS

## 2013-01-03 MED ORDER — PHENYLEPHRINE 40 MCG/ML (10ML) SYRINGE FOR IV PUSH (FOR BLOOD PRESSURE SUPPORT)
PREFILLED_SYRINGE | INTRAVENOUS | Status: AC
  Filled 2013-01-03: qty 10

## 2013-01-03 MED ORDER — LACTATED RINGERS IV SOLN
INTRAVENOUS | Status: DC | PRN
  Administered 2013-01-03: 1000 mL via INTRAVENOUS

## 2013-01-03 MED ORDER — GLYCOPYRROLATE 0.2 MG/ML IJ SOLN
INTRAMUSCULAR | Status: DC | PRN
  Administered 2013-01-03: .6 mg via INTRAVENOUS

## 2013-01-03 MED ORDER — ONDANSETRON HCL 4 MG/2ML IJ SOLN
INTRAMUSCULAR | Status: AC
  Filled 2013-01-03: qty 2

## 2013-01-03 MED ORDER — ONDANSETRON HCL 4 MG/2ML IJ SOLN
INTRAMUSCULAR | Status: DC | PRN
  Administered 2013-01-03: 4 mg via INTRAVENOUS

## 2013-01-03 MED ORDER — HYDROMORPHONE HCL PF 1 MG/ML IJ SOLN
1.0000 mg | INTRAMUSCULAR | Status: DC | PRN
  Administered 2013-01-03 – 2013-01-04 (×6): 1 mg via INTRAVENOUS
  Filled 2013-01-03 (×7): qty 1

## 2013-01-03 MED ORDER — CYCLOBENZAPRINE HCL 10 MG PO TABS
10.0000 mg | ORAL_TABLET | Freq: Every day | ORAL | Status: DC
  Administered 2013-01-03 – 2013-01-07 (×4): 10 mg via ORAL
  Filled 2013-01-03 (×6): qty 1

## 2013-01-03 MED ORDER — SUCCINYLCHOLINE CHLORIDE 20 MG/ML IJ SOLN
INTRAMUSCULAR | Status: DC | PRN
  Administered 2013-01-03: 100 mg via INTRAVENOUS

## 2013-01-03 MED ORDER — MIDAZOLAM HCL 5 MG/5ML IJ SOLN
INTRAMUSCULAR | Status: DC | PRN
  Administered 2013-01-03: 2 mg via INTRAVENOUS

## 2013-01-03 MED ORDER — GLYCOPYRROLATE 0.2 MG/ML IJ SOLN
INTRAMUSCULAR | Status: AC
  Filled 2013-01-03: qty 3

## 2013-01-03 MED ORDER — FENTANYL CITRATE 0.05 MG/ML IJ SOLN
25.0000 ug | INTRAMUSCULAR | Status: DC | PRN
  Administered 2013-01-03: 25 ug via INTRAVENOUS
  Administered 2013-01-03: 50 ug via INTRAVENOUS
  Administered 2013-01-03: 25 ug via INTRAVENOUS

## 2013-01-03 MED ORDER — NEOSTIGMINE METHYLSULFATE 1 MG/ML IJ SOLN
INTRAMUSCULAR | Status: AC
  Filled 2013-01-03: qty 10

## 2013-01-03 MED ORDER — SERTRALINE HCL 100 MG PO TABS
100.0000 mg | ORAL_TABLET | Freq: Every morning | ORAL | Status: DC
  Administered 2013-01-04 – 2013-01-07 (×4): 100 mg via ORAL
  Filled 2013-01-03 (×5): qty 1

## 2013-01-03 MED ORDER — KETOROLAC TROMETHAMINE 30 MG/ML IJ SOLN
INTRAMUSCULAR | Status: AC
  Administered 2013-01-03: 14:00:00
  Filled 2013-01-03: qty 1

## 2013-01-03 MED ORDER — MIDAZOLAM HCL 2 MG/2ML IJ SOLN
INTRAMUSCULAR | Status: AC
  Filled 2013-01-03: qty 2

## 2013-01-03 MED ORDER — PROPOFOL 10 MG/ML IV BOLUS
INTRAVENOUS | Status: AC
  Filled 2013-01-03: qty 20

## 2013-01-03 MED ORDER — ONDANSETRON HCL 4 MG PO TABS: 4.0000 mg | ORAL_TABLET | Freq: Four times a day (QID) | ORAL | Status: DC | PRN

## 2013-01-03 MED ORDER — CIPROFLOXACIN IN D5W 400 MG/200ML IV SOLN
400.0000 mg | Freq: Two times a day (BID) | INTRAVENOUS | Status: AC
Start: 2013-01-03 — End: 2013-01-03
  Administered 2013-01-03: 400 mg via INTRAVENOUS
  Filled 2013-01-03: qty 200

## 2013-01-03 MED ORDER — OXYCODONE HCL 5 MG PO TABS
5.0000 mg | ORAL_TABLET | ORAL | Status: DC | PRN
  Administered 2013-01-05 – 2013-01-08 (×5): 5 mg via ORAL
  Filled 2013-01-03 (×5): qty 1

## 2013-01-03 MED ORDER — POTASSIUM CHLORIDE IN NACL 20-0.9 MEQ/L-% IV SOLN
INTRAVENOUS | Status: DC
  Administered 2013-01-03 – 2013-01-07 (×6): via INTRAVENOUS
  Filled 2013-01-03 (×12): qty 1000

## 2013-01-03 MED ORDER — ROCURONIUM BROMIDE 100 MG/10ML IV SOLN
INTRAVENOUS | Status: DC | PRN
  Administered 2013-01-03: 5 mg via INTRAVENOUS
  Administered 2013-01-03: 25 mg via INTRAVENOUS

## 2013-01-03 MED ORDER — BUPIVACAINE HCL (PF) 0.5 % IJ SOLN
INTRAMUSCULAR | Status: DC | PRN
  Administered 2013-01-03: 30 mL

## 2013-01-03 SURGICAL SUPPLY — 48 items
ADH SKN CLS APL DERMABOND .7 (GAUZE/BANDAGES/DRESSINGS) ×1
APL SKNCLS STERI-STRIP NONHPOA (GAUZE/BANDAGES/DRESSINGS) ×1
BANDAGE ADHESIVE 1X3 (GAUZE/BANDAGES/DRESSINGS) IMPLANT
BENZOIN TINCTURE PRP APPL 2/3 (GAUZE/BANDAGES/DRESSINGS) ×2 IMPLANT
BINDER ABD UNIV 12 45-62 (WOUND CARE) IMPLANT
BINDER ABDOMINAL 46IN 62IN (WOUND CARE)
CANISTER SUCTION 2500CC (MISCELLANEOUS) ×1 IMPLANT
CHLORAPREP W/TINT 26ML (MISCELLANEOUS) ×2 IMPLANT
DECANTER SPIKE VIAL GLASS SM (MISCELLANEOUS) ×2 IMPLANT
DERMABOND ADVANCED (GAUZE/BANDAGES/DRESSINGS) ×1
DERMABOND ADVANCED .7 DNX12 (GAUZE/BANDAGES/DRESSINGS) IMPLANT
DEVICE SECURE STRAP 25 ABSORB (INSTRUMENTS) ×2 IMPLANT
DEVICE TROCAR PUNCTURE CLOSURE (ENDOMECHANICALS) ×2 IMPLANT
DRAPE LAPAROSCOPIC ABDOMINAL (DRAPES) ×2 IMPLANT
DRSG TEGADERM 2-3/8X2-3/4 SM (GAUZE/BANDAGES/DRESSINGS) IMPLANT
ELECT REM PT RETURN 9FT ADLT (ELECTROSURGICAL) ×2
ELECTRODE REM PT RTRN 9FT ADLT (ELECTROSURGICAL) ×1 IMPLANT
GLOVE BIOGEL PI IND STRL 7.0 (GLOVE) ×1 IMPLANT
GLOVE BIOGEL PI INDICATOR 7.0 (GLOVE) ×1
GLOVE SURG SIGNA 7.5 PF LTX (GLOVE) ×4 IMPLANT
GOWN PREVENTION PLUS LG XLONG (DISPOSABLE) ×2 IMPLANT
GOWN STRL REIN XL XLG (GOWN DISPOSABLE) ×4 IMPLANT
KIT BASIN OR (CUSTOM PROCEDURE TRAY) ×2 IMPLANT
MARKER SKIN DUAL TIP RULER LAB (MISCELLANEOUS) ×2 IMPLANT
MESH VENTRALIGHT ST 8X10 (Mesh General) ×1 IMPLANT
NDL INSUFFLATION 14GA 120MM (NEEDLE) IMPLANT
NDL SPNL 22GX3.5 QUINCKE BK (NEEDLE) ×1 IMPLANT
NEEDLE INSUFFLATION 14GA 120MM (NEEDLE) IMPLANT
NEEDLE SPNL 22GX3.5 QUINCKE BK (NEEDLE) ×2 IMPLANT
NS IRRIG 1000ML POUR BTL (IV SOLUTION) ×2 IMPLANT
PENCIL BUTTON HOLSTER BLD 10FT (ELECTRODE) IMPLANT
SCALPEL HARMONIC ACE (MISCELLANEOUS) ×1 IMPLANT
SCISSORS LAP 5X35 DISP (ENDOMECHANICALS) ×1 IMPLANT
SET IRRIG TUBING LAPAROSCOPIC (IRRIGATION / IRRIGATOR) IMPLANT
SLEEVE XCEL OPT CAN 5 100 (ENDOMECHANICALS) ×1 IMPLANT
SOLUTION ANTI FOG 6CC (MISCELLANEOUS) ×2 IMPLANT
STRIP CLOSURE SKIN 1/2X4 (GAUZE/BANDAGES/DRESSINGS) ×4 IMPLANT
SUT MNCRL AB 4-0 PS2 18 (SUTURE) ×1 IMPLANT
SUT NOVA NAB DX-16 0-1 5-0 T12 (SUTURE) ×4 IMPLANT
TACKER 5MM HERNIA 3.5CML NAB (ENDOMECHANICALS) IMPLANT
TOWEL OR 17X26 10 PK STRL BLUE (TOWEL DISPOSABLE) ×2 IMPLANT
TRAY FOLEY CATH 14FRSI W/METER (CATHETERS) ×1 IMPLANT
TRAY LAP CHOLE (CUSTOM PROCEDURE TRAY) ×2 IMPLANT
TROCAR BLADELESS OPT 5 75 (ENDOMECHANICALS) ×2 IMPLANT
TROCAR XCEL BLUNT TIP 100MML (ENDOMECHANICALS) IMPLANT
TROCAR XCEL NON-BLD 11X100MML (ENDOMECHANICALS) ×2 IMPLANT
TROCAR XCEL UNIV SLVE 11M 100M (ENDOMECHANICALS) ×2 IMPLANT
TUBING INSUFFLATION 10FT LAP (TUBING) ×2 IMPLANT

## 2013-01-03 NOTE — Transfer of Care (Signed)
Immediate Anesthesia Transfer of Care Note  Patient: [name]  Procedure(s) Performed: Procedure(s): LAPAROSCOPIC INCISIONAL HERNIA (N/A) INSERTION OF MESH (N/A)  Patient Location: PACU  Anesthesia Type:General  Level of Consciousness: awake, alert  and oriented  Airway & Oxygen Therapy: Patient Spontanous Breathing and Patient connected to face mask oxygen  Post-op Assessment: Report given to PACU RN and Post -op Vital signs reviewed and stable  Post vital signs: Reviewed and stable  Complications: No apparent anesthesia complications

## 2013-01-03 NOTE — H&P (Signed)
[name] is an 57 y.o. female.   Chief Complaint: Ventral incisional hernia HPI: She is status post exploratory laparotomy for a carcinoid tumor of the stomach. She now has a ventral incisional hernia which is symptomatic. She has discomfort but no nausea or vomiting. She is otherwise without complaints.  Past Medical History  Diagnosis Date  . Hypertension   . Gout   . GERD (gastroesophageal reflux disease)   . History of kidney cancer   . Autoimmune hepatitis   . Rheumatoid arthritis(714.0)   . Anemia   . History of transfusion   . Depression   . Cancer 2006    renal cancer / stomach cancer  . Incisional hernia     Past Surgical History  Procedure Laterality Date  . Abdominal hysterectomy    . Partial nephrectomy    . Eus N/A 04/17/2012    Procedure: ESOPHAGEAL ENDOSCOPIC ULTRASOUND (EUS) RADIAL;  Surgeon: Willis Modena, MD;  Location: [facility];  Service: Endoscopy;  Laterality: N/A;  . Tonsillectomy and adenoidectomy    . Breast biopsy      "several"  . Cholecystectomy    . Wisdom tooth extraction    . Finger surgery      mass removed  . Gastrectomy N/A 05/08/2012    Procedure: Excision gastric mass, possible partial gastrectomy;  Surgeon: Shelly Rubenstein, MD;  Location: [facility] OR;  Service: General;  Laterality: N/A;    Family History  Problem Relation Age of Onset  . Heart failure Father   . Diabetes Father   . Diverticulitis Mother   . Cancer Mother     breast  . Cancer Maternal Grandmother     colon   Social History:  reports that she has never smoked. She has never used smokeless tobacco. She reports that she drinks alcohol. She reports that she does not use illicit drugs.  Allergies:  Allergies  Allergen Reactions  . Aspirin Nausea Only  . Lisinopril Cough  . Methotrexate Derivatives Other (See Comments)    Elevated liver enzymes  . Morphine And Related Nausea And Vomiting  . Other Swelling    All mycins-swelling  . Penicillins Other (See  Comments)    Yeast infection  . Aloe Rash    Burn rash  . Lactose Intolerance (Gi) Nausea Only  . Latex Rash    sensitivity  . Prozac [Fluoxetine Hcl] Rash    No prescriptions prior to admission    No results found for this or any previous visit (from the past 48 hour(s)). No results found.  Review of Systems  All other systems reviewed and are negative.    There were no vitals taken for this visit. Physical Exam  Constitutional: She is oriented to person, place, and time.  obese  HENT:  Head: Normocephalic and atraumatic.  Eyes: Conjunctivae are normal. Pupils are equal, round, and reactive to light.  Neck: No tracheal deviation present.  Cardiovascular: Normal rate, regular rhythm and normal heart sounds.   Respiratory: Breath sounds normal. She has no wheezes.  GI: Soft. Bowel sounds are normal. There is no tenderness.  Well-healed upper midline incision with reducible incisional hernia  Musculoskeletal: Normal range of motion.  Lymphadenopathy:    She has no cervical adenopathy.  Neurological: She is alert and oriented to person, place, and time.  Skin: Skin is warm. No erythema.  Psychiatric: Her behavior is normal. Judgment normal.     Assessment/Plan Ventral incisional hernia  Laparoscopic repair with mesh as planned. I  discussed the risks which includes but is not limited to bleeding, infection, recurrence, injury to surrounding structures, need to convert to an open procedure, cardiopulmonary problems, DVT, need for further surgery, et Karie Soda. She understands and wishes to proceed. Surgery is scheduled  Romelia Bromell A 01/03/2013, 6:51 AM

## 2013-01-03 NOTE — Progress Notes (Signed)
Patient due to void, but hasn't had urge to go.  Bladder scan revealed 230 ml's.  Will continue to monitor.  Philomena Doheny RN

## 2013-01-03 NOTE — Anesthesia Preprocedure Evaluation (Signed)
Anesthesia Evaluation  Patient identified by MRN, date of birth, ID band Patient awake    Reviewed: Allergy & Precautions, H&P , NPO status , Patient's Chart, lab work & pertinent test results  Airway Mallampati: II TM Distance: >3 FB Neck ROM: Full    Dental no notable dental hx.    Pulmonary neg pulmonary ROS,  breath sounds clear to auscultation  Pulmonary exam normal       Cardiovascular hypertension, Pt. on medications Rhythm:Regular Rate:Normal     Neuro/Psych negative neurological ROS  negative psych ROS   GI/Hepatic negative GI ROS, (+) Hepatitis -, Autoimmune  Endo/Other  Morbid obesity  Renal/GU negative Renal ROS  negative genitourinary   Musculoskeletal  (+) Arthritis -, Rheumatoid disorders,    Abdominal   Peds negative pediatric ROS (+)  Hematology negative hematology ROS (+)   Anesthesia Other Findings   Reproductive/Obstetrics negative OB ROS                           Anesthesia Physical Anesthesia Plan  ASA: III  Anesthesia Plan: General   Post-op Pain Management:    Induction: Intravenous  Airway Management Planned: Oral ETT  Additional Equipment:   Intra-op Plan:   Post-operative Plan: Extubation in OR  Informed Consent: I have reviewed the patients History and Physical, chart, labs and discussed the procedure including the risks, benefits and alternatives for the proposed anesthesia with the patient or authorized representative who has indicated his/her understanding and acceptance.   Dental advisory given  Plan Discussed with: CRNA and Surgeon  Anesthesia Plan Comments:         Anesthesia Quick Evaluation

## 2013-01-03 NOTE — Op Note (Signed)
LAPAROSCOPIC INCISIONAL HERNIA, INSERTION OF MESH  Procedure Note  [name] 01/03/2013   Pre-op Diagnosis: incisional hernia     Post-op Diagnosis: same  Procedure(s): LAPAROSCOPIC INCISIONAL HERNIA INSERTION OF MESH  Surgeon(s): Shelly Rubenstein, MD  Anesthesia: General  Staff:  Circulator: Alcide Evener, RN Relief Circulator: Lestine Box, RN Relief Scrub: Lestine Box, RN Scrub Person: Beryl Meager, CST; Lubertha South, RN  Estimated Blood Loss: Minimal                         [name]   Date: 01/03/2013  Time: 12:30 PM

## 2013-01-04 LAB — CBC
HCT: 26.1 % — ABNORMAL LOW (ref 36.0–46.0)
HCT: 25.2 % — ABNORMAL LOW (ref 36.0–46.0)
Hemoglobin: 8.9 g/dL — ABNORMAL LOW (ref 12.0–15.0)
Hemoglobin: 8.6 g/dL — ABNORMAL LOW (ref 12.0–15.0)
MCH: 31.9 pg (ref 26.0–34.0)
MCH: 31.5 pg (ref 26.0–34.0)
MCHC: 32.5 g/dL (ref 30.0–36.0)
MCV: 96.7 fL (ref 78.0–100.0)
MCV: 96.9 fL (ref 78.0–100.0)
Platelets: 166 10*3/uL (ref 150–400)
Platelets: 177 10*3/uL (ref 150–400)
RBC: 2.79 MIL/uL — ABNORMAL LOW (ref 3.87–5.11)
RBC: 2.7 MIL/uL — ABNORMAL LOW (ref 3.87–5.11)
RBC: 2.6 MIL/uL — ABNORMAL LOW (ref 3.87–5.11)
RDW: 14.3 % (ref 11.5–15.5)
WBC: 9.8 10*3/uL (ref 4.0–10.5)
WBC: 12.3 10*3/uL — ABNORMAL HIGH (ref 4.0–10.5)

## 2013-01-04 MED ORDER — SODIUM CHLORIDE 0.9 % IV BOLUS (SEPSIS)
500.0000 mL | Freq: Once | INTRAVENOUS | Status: AC
  Administered 2013-01-04: 500 mL via INTRAVENOUS

## 2013-01-04 MED ORDER — HYDROMORPHONE HCL PF 1 MG/ML IJ SOLN
0.5000 mg | INTRAMUSCULAR | Status: DC | PRN
  Administered 2013-01-04: 0.5 mg via INTRAVENOUS
  Administered 2013-01-05: 1 mg via INTRAVENOUS
  Administered 2013-01-05 (×2): 0.5 mg via INTRAVENOUS
  Administered 2013-01-06: 1 mg via INTRAVENOUS
  Filled 2013-01-04 (×7): qty 1

## 2013-01-04 MED ORDER — ALBUMIN HUMAN 5 % IV SOLN
25.0000 g | Freq: Once | INTRAVENOUS | Status: AC
  Administered 2013-01-04: 25 g via INTRAVENOUS
  Filled 2013-01-04: qty 500

## 2013-01-04 NOTE — Progress Notes (Signed)
Pt concered that she was so tired.  BP checked low at 88/50.  Pt has albumin order, initiated albumin. Amaurie Wandel, Doran Durand, RN

## 2013-01-04 NOTE — Progress Notes (Signed)
Pt c/o abd pain and tenderness.  Pt BP 82/59, HR 110, resp 20, O2 92 5L. Temp 99.5  Pt c/o difficulty taking deep breathes due to pain.  Pt still unable to void since i/o cath at 2pm today.    Pt bladder scanned .  Spoke with dr Abbey Chatters, new orders given.  Will continue to monitor.

## 2013-01-04 NOTE — Progress Notes (Signed)
1 Day Post-Op  Subjective: Low urine output overnight.  Very sore.  No belching.  Tolerating clears.    Objective: Vital signs in last 24 hours: Temp:  [97.1 F (36.2 C)-98.6 F (37 C)] 97.8 F (36.6 C) (11/22 0550) Pulse Rate:  [0-111] 93 (11/22 0550) Resp:  [12-20] 20 (11/21 2128) BP: (99-134)/(51-70) 99/57 mmHg (11/22 0550) SpO2:  [91 %-100 %] 93 % (11/22 0550) Weight:  [196 lb 15.7 oz (89.35 kg)] 196 lb 15.7 oz (89.35 kg) (11/21 1500) Last BM Date: 01/02/13  Intake/Output from previous day: 11/21 0701 - 11/22 0700 In: 3773.3 [I.V.:3773.3] Out: 200 [Urine:200] Intake/Output this shift:    General appearance: alert, cooperative and no distress Resp: breathing comfortably GI: soft, mildly distended.  approp tender. Extremities: extremities normal, atraumatic, no cyanosis or edema  Lab Results:   Recent Labs  01/04/13 0518  WBC 9.8  HGB 8.9*  HCT 26.7*  PLT 166   BMET No results found for this basename: NA, K, CL, CO2, GLUCOSE, BUN, CREATININE, CALCIUM,  in the last 72 hours PT/INR No results found for this basename: LABPROT, INR,  in the last 72 hours ABG No results found for this basename: PHART, PCO2, PO2, HCO3,  in the last 72 hours  Studies/Results: No results found.  Anti-infectives: Anti-infectives   Start     Dose/Rate Route Frequency Ordered Stop   01/03/13 2200  ciprofloxacin (CIPRO) IVPB 400 mg     400 mg 200 mL/hr over 60 Minutes Intravenous Every 12 hours 01/03/13 1440 01/03/13 2246   01/03/13 0826  ciprofloxacin (CIPRO) IVPB 400 mg     400 mg 200 mL/hr over 60 Minutes Intravenous On call to O.R. 01/03/13 0826 01/03/13 1032      Assessment/Plan: s/p Procedure(s): LAPAROSCOPIC INCISIONAL HERNIA (N/A) INSERTION OF MESH (N/A)  ABL anemia, oliguria hold lovenox Set up type and cross in case blood count drops more. Will give albumin bolus.   Strict ins and outs. Incentive spirometry  LOS: 1 day    [facility] 01/04/2013

## 2013-01-04 NOTE — Op Note (Signed)
NAMESHAQUILLE, MURDY NO.:  [id number]  MEDICAL RECORD NO.:  [id number]  LOCATION:  1517                         FACILITY:  [facility]  PHYSICIAN:  Abigail Miyamoto, M.D. DATE OF BIRTH:  1955-07-28  DATE OF PROCEDURE:  01/03/2013 DATE OF DISCHARGE:                              OPERATIVE REPORT   PREOPERATIVE DIAGNOSIS:  Ventral incisional hernia.  POSTOPERATIVE DIAGNOSIS:  Ventral incisional hernia.  PROCEDURE:  Laparoscopic incisional hernia repair with mesh.  SURGEON:  Abigail Miyamoto, MD  ANESTHESIA:  General endotracheal anesthesia and 0.25% Marcaine.  ESTIMATED BLOOD LOSS:  Minimal.  FINDINGS:  The patient was found to have a large ventral hernia in the upper abdomen.  There was a large amount of adhesions of omentum and the colon into the hernia which were able to be reduced.  The hernia was repaired with a 25 cm x 20 cm piece of Ventralight Prolene mesh from [employer].  Greater than 4 cm of overlap was achieved.  PROCEDURE IN DETAIL:  The patient was brought to the operating room and identified as [name].  She was placed supine on the operating room table and general anesthesia was induced.  Her abdomen was then prepped and draped in the usual sterile fashion.  I made a small incision in the left upper quadrant with a scalpel.  I then used the 5- mm trocar and Optiview camera  to traverse all layers of the abdominal wall under direct vision to gain entrance to the peritoneal cavity. Insufflation of the abdomen was then begun.  I examined the entrance site and saw no evidence of injury.  I then placed a 5-mm port in the patient's left lower quadrant and then began takedown of adhesions.  The patient had a large amount of adhesions of omentum and the colon going into the hernia sac.  I was able to take down the adhesions both bluntly as well as with the laparoscopic scissors and Harmonic Scalpel.  I examined the colon diligently and did not appear to  have any injury to the colon after reduction of all the adhesions.  At this point, the area of the hernia sac was completely identified.  I placed an 11-mm port in the patient's right lower quadrant and another 5-mm in the patient's mid left quadrant.  Prior to repairing the hernia, there appeared to be bleeding from the omentum.  I appeared to get this under control with the Harmonic Scalpel.  I washed this area for some time and saw no further bleeding.  I then measured the defect in the fascia, then brought a 25 x 20 cm piece of Prolene of Ventralight [employer] mesh onto the field.  I placed 4 separate stay sutures in the mesh.  I then rolled the mesh and placed an 11-mm trocar after soaking in saline.  I then unrolled the mesh.  I made 4 separate stab incisions and used a  suture passer to pull the 4 separate sutures up to the abdominal wall.  I then secured these in place pulling the mesh up against the peritoneum.  Wide greater than 4 cm coverage of the fascial defect appeared to be achieved.  I then used  the SecureStrap surgical tacker to tack the mesh in circumferentially.  Again, wide coverage of the defect appeared to be achieved.  I then again examined the colon and the omentum.  Hemostasis appeared to be achieved.  I thoroughly irrigated the abdomen with saline and again hemostasis appeared to be achieved.  All ports were removed under direct vision.  The abdomen was deflated.  All incisions were anesthetized with Marcaine and closed with 4-0 Monocryl sutures and Dermabond.  The patient tolerated the procedure well.  All the counts were correct at the end of procedure.  The patient was then extubated in the operating room and taken in stable condition to recovery room.     Abigail Miyamoto, M.D.     DB/MEDQ  D:  01/03/2013  T:  01/04/2013  Job:  [id number]

## 2013-01-05 LAB — CBC
HCT: 25.7 % — ABNORMAL LOW (ref 36.0–46.0)
Hemoglobin: 8.6 g/dL — ABNORMAL LOW (ref 12.0–15.0)
MCH: 32.2 pg (ref 26.0–34.0)
MCHC: 33.5 g/dL (ref 30.0–36.0)
MCV: 96.3 fL (ref 78.0–100.0)
RDW: 14.4 % (ref 11.5–15.5)

## 2013-01-05 MED ORDER — PROMETHAZINE HCL 25 MG/ML IJ SOLN
12.5000 mg | Freq: Four times a day (QID) | INTRAMUSCULAR | Status: DC | PRN
  Administered 2013-01-05: 12.5 mg via INTRAVENOUS
  Filled 2013-01-05: qty 1

## 2013-01-05 NOTE — Progress Notes (Signed)
Patient ID: [name], female   DOB: Nov 28, 1955, 57 y.o.   MRN: [id number] 2 Days Post-Op  Subjective: Urine output improved.  One episode of small volume emesis last night.  Pt does not know if she has passed gas, but had small amt staining of stool on bed this AM.  No nausea this AM.  Tolerated fulls.  Is still very sore.    Objective: Vital signs in last 24 hours: Temp:  [97.7 F (36.5 C)-99.5 F (37.5 C)] 97.7 F (36.5 C) (11/23 0611) Pulse Rate:  [83-121] 83 (11/23 0611) Resp:  [18-20] 18 (11/22 2134) BP: (57-113)/(47-73) 95/67 mmHg (11/23 0611) SpO2:  [90 %-97 %] 96 % (11/23 0611) Last BM Date: 01/02/13  Intake/Output from previous day: 11/22 0701 - 11/23 0700 In: 1423.3 [I.V.:923.3; IV Piggyback:500] Out: 2000 [Urine:2000] Intake/Output this shift:    General appearance: alert, cooperative and no distress Resp: breathing comfortably GI: soft, mildly distended.  approp tender. Extremities: extremities normal, atraumatic, no cyanosis or edema  Lab Results:   Recent Labs  01/04/13 2102 01/05/13 0407  WBC 12.6* 13.0*  HGB 8.2* 8.6*  HCT 25.2* 25.7*  PLT 177 171   BMET No results found for this basename: NA, K, CL, CO2, GLUCOSE, BUN, CREATININE, CALCIUM,  in the last 72 hours PT/INR No results found for this basename: LABPROT, INR,  in the last 72 hours ABG No results found for this basename: PHART, PCO2, PO2, HCO3,  in the last 72 hours  Studies/Results: No results found.  Anti-infectives: Anti-infectives   Start     Dose/Rate Route Frequency Ordered Stop   01/03/13 2200  ciprofloxacin (CIPRO) IVPB 400 mg     400 mg 200 mL/hr over 60 Minutes Intravenous Every 12 hours 01/03/13 1440 01/03/13 2246   01/03/13 0826  ciprofloxacin (CIPRO) IVPB 400 mg     400 mg 200 mL/hr over 60 Minutes Intravenous On call to O.R. 01/03/13 0826 01/03/13 1032      Assessment/Plan: s/p Procedure(s): LAPAROSCOPIC INCISIONAL HERNIA (N/A) INSERTION OF MESH (N/A)  ABL  anemia- appears stable.   hold lovenox Oliguria resolved.    Strict ins and outs. Incentive spirometry Still appears to have a fair bit of ileus which is not unexpected given difficulty of surgery.  Would not advance diet at this time.     LOS: 2 days    [facility] 01/05/2013

## 2013-01-05 NOTE — Progress Notes (Addendum)
Pt weaned to 2 l [place].  O2 Sats at 95 %. Alexina Niccoli, Doran Durand, RN

## 2013-01-05 NOTE — Progress Notes (Signed)
Patient given incentive spirometry with instructions of how to use.  We encouraged deep slow breathing and instructed on pillow splinting of incision.  Explained reason for clearing lungs following surgery.

## 2013-01-05 NOTE — Progress Notes (Signed)
Called to room 1517 per floor RN. Pt with increasing 02 need, soft bp and  worsening abd pain. Upon my arrival pt found in bed resting quietly. Pt just received 0.5mg  Dilaudid IV, denies pain, denies SOB. Pt lethargic but oriented X 4 and MAE. Placed on RRT monitor. 0400-92 bpm SR, 110/65, 92 po2 % on 5 LNC 16 rr, lungs clear. 0415--96 bpm, 104/58, 93%, 17rr. 0430--94bpm, 109/73, 93% po2  16rr. Pt pupils 3 equal round reactive bilat. BP improved  after 500 ml bolus tonight. Lab at bedside for AM labs. RN encouraged to call for any worsening conditions. Pt left resting in bed quietly.

## 2013-01-06 ENCOUNTER — Encounter (HOSPITAL_COMMUNITY): Payer: Self-pay | Admitting: Surgery

## 2013-01-06 ENCOUNTER — Inpatient Hospital Stay (HOSPITAL_COMMUNITY): Payer: Commercial Managed Care - PPO

## 2013-01-06 LAB — CBC
Platelets: 162 10*3/uL (ref 150–400)
RDW: 14.1 % (ref 11.5–15.5)
WBC: 12.7 10*3/uL — ABNORMAL HIGH (ref 4.0–10.5)

## 2013-01-06 LAB — BASIC METABOLIC PANEL
CO2: 31 mEq/L (ref 19–32)
Chloride: 99 mEq/L (ref 96–112)
Creatinine, Ser: 0.67 mg/dL (ref 0.50–1.10)
GFR calc Af Amer: >90 mL/min (ref >90)
Potassium: 3.1 mEq/L — ABNORMAL LOW (ref 3.5–5.1)
Sodium: 137 mEq/L (ref 135–145)

## 2013-01-06 MED ORDER — METOCLOPRAMIDE HCL 5 MG/ML IJ SOLN: 5.0000 mg | Freq: Three times a day (TID) | INTRAMUSCULAR | Status: DC | PRN

## 2013-01-06 MED ORDER — CIPROFLOXACIN IN D5W 400 MG/200ML IV SOLN
400.0000 mg | Freq: Two times a day (BID) | INTRAVENOUS | Status: DC
  Administered 2013-01-06 – 2013-01-07 (×4): 400 mg via INTRAVENOUS
  Filled 2013-01-06 (×5): qty 200

## 2013-01-06 MED ORDER — KETOROLAC TROMETHAMINE 30 MG/ML IJ SOLN
30.0000 mg | Freq: Three times a day (TID) | INTRAMUSCULAR | Status: AC
  Administered 2013-01-06 – 2013-01-07 (×3): 30 mg via INTRAVENOUS
  Filled 2013-01-06 (×3): qty 1

## 2013-01-06 MED ORDER — POTASSIUM CHLORIDE 10 MEQ/100ML IV SOLN
10.0000 meq | INTRAVENOUS | Status: AC
  Administered 2013-01-06 (×3): 10 meq via INTRAVENOUS
  Filled 2013-01-06 (×3): qty 100

## 2013-01-06 NOTE — Progress Notes (Signed)
3 Days Post-Op  Subjective: Feeling worse this morning.  More abdominal pain.  Nausea but passing flatus.  Objective: Vital signs in last 24 hours: Temp:  [97.8 F (36.6 C)-99.1 F (37.3 C)] 97.9 F (36.6 C) (11/24 0623) Pulse Rate:  [64-94] 94 (11/24 0623) Resp:  [18] 18 (11/24 0623) BP: (113-122)/(63-81) 122/81 mmHg (11/24 0623) SpO2:  [96 %-98 %] 96 % (11/24 0623) Last BM Date: 01/02/13  Intake/Output from previous day: 11/23 0701 - 11/24 0700 In: 480 [P.O.:480] Out: 5550 [Urine:5550] Intake/Output this shift:    Abdomen soft, moderately tender throughout  Lab Results:   Recent Labs  01/04/13 2102 01/05/13 0407  WBC 12.6* 13.0*  HGB 8.2* 8.6*  HCT 25.2* 25.7*  PLT 177 171   BMET No results found for this basename: NA, K, CL, CO2, GLUCOSE, BUN, CREATININE, CALCIUM,  in the last 72 hours PT/INR No results found for this basename: LABPROT, INR,  in the last 72 hours ABG No results found for this basename: PHART, PCO2, PO2, HCO3,  in the last 72 hours  Studies/Results: No results found.  Anti-infectives: Anti-infectives   Start     Dose/Rate Route Frequency Ordered Stop   01/03/13 2200  ciprofloxacin (CIPRO) IVPB 400 mg     400 mg 200 mL/hr over 60 Minutes Intravenous Every 12 hours 01/03/13 1440 01/03/13 2246   01/03/13 0826  ciprofloxacin (CIPRO) IVPB 400 mg     400 mg 200 mL/hr over 60 Minutes Intravenous On call to O.R. 01/03/13 0826 01/03/13 1032      Assessment/Plan: s/p Procedure(s): LAPAROSCOPIC INCISIONAL HERNIA (N/A) INSERTION OF MESH (N/A)  Will check labs and KUB.  If not improving by later today, will need exp lap.  LOS: 3 days    Yaelis Scharfenberg A 01/06/2013

## 2013-01-06 NOTE — Progress Notes (Signed)
INITIAL NUTRITION ASSESSMENT  DOCUMENTATION CODES Per approved criteria  -Obesity Unspecified   INTERVENTION: - Diet advancement per MD - Will continue to monitor   NUTRITION DIAGNOSIS: Inadequate oral intake related to inability to eat as evidenced by NPO.   Goal: Advance diet as tolerated to regular diet  Monitor:  Weights, labs, diet advancement, abdominal pain, nausea/vomiting  Reason for Assessment: MST  57 y.o. female  Admitting Dx: Ventral incisional hernia  ASSESSMENT: S/p gastrectomy in March 2014 for excision of gastric mass, has lost 50 pounds since then. Admitted with ventral incisional hernia which was repaired 11/21. Had abdominal pain and nausea afterwards, had small episode of emesis 11/22.   Met with pt who reports eating well PTA, 3 meals/day with good appetite. Reports nausea and abdominal pain under control currently.   Potassium low, getting IV replacement.   Height: Ht Readings from Last 1 Encounters:  01/03/13 5\' 2"  (1.575 m)    Weight: Wt Readings from Last 1 Encounters:  01/03/13 196 lb 15.7 oz (89.35 kg)    Ideal Body Weight: 110 lb   % Ideal Body Weight: 178%  Wt Readings from Last 10 Encounters:  01/03/13 196 lb 15.7 oz (89.35 kg)  01/03/13 196 lb 15.7 oz (89.35 kg)  12/27/12 197 lb (89.359 kg)  12/23/12 198 lb 9.6 oz (90.084 kg)  12/04/12 197 lb 3.2 oz (89.449 kg)  11/20/12 199 lb 9.6 oz (90.538 kg)  10/28/12 203 lb (92.08 kg)  10/18/12 211 lb 3.2 oz (95.8 kg)  10/17/12 209 lb 9.6 oz (95.074 kg)  10/16/12 213 lb 3.2 oz (96.707 kg)    Usual Body Weight: 246 lb in March 2014  % Usual Body Weight: 80%  BMI:  Body mass index is 36.02 kg/(m^2). Class II obesity  Estimated Nutritional Needs: Kcal: 1450-1650 Protein: 65-75g Fluid: 1.4-1.6L/day   Skin: Abdominal incision   Diet Order: NPO  EDUCATION NEEDS: -No education needs identified at this time   Intake/Output Summary (Last 24 hours) at 01/06/13 1136 Last data  filed at 01/06/13 0700  Gross per 24 hour  Intake    480 ml  Output   5550 ml  Net  -5070 ml    Last BM: 11/20  Labs:   Recent Labs Lab 01/06/13 0800  NA 137  K 3.1*  CL 99  CO2 31  BUN 8  CREATININE 0.67  CALCIUM 9.4  GLUCOSE 122*    CBG (last 3)  No results found for this basename: GLUCAP,  in the last 72 hours  Scheduled Meds: . ciprofloxacin  400 mg Intravenous Q12H  . cyclobenzaprine  10 mg Oral QHS  . furosemide  20 mg Oral BID  . losartan  100 mg Oral Daily   And  . hydrochlorothiazide  25 mg Oral Daily  . pantoprazole  40 mg Oral Daily  . potassium chloride  10 mEq Intravenous Q1 Hr x 3  . sertraline  100 mg Oral q morning - 10a    Continuous Infusions: . 0.9 % NaCl with KCl 20 mEq / L 100 mL/hr at 01/06/13 0154    Past Medical History  Diagnosis Date  . Hypertension   . Gout   . GERD (gastroesophageal reflux disease)   . History of kidney cancer   . Autoimmune hepatitis   . Rheumatoid arthritis(714.0)   . Anemia   . History of transfusion   . Depression   . Cancer 2006    renal cancer / stomach cancer  . Incisional  hernia     Past Surgical History  Procedure Laterality Date  . Abdominal hysterectomy    . Partial nephrectomy    . Eus N/A 04/17/2012    Procedure: ESOPHAGEAL ENDOSCOPIC ULTRASOUND (EUS) RADIAL;  Surgeon: Willis Modena, MD;  Location: [facility];  Service: Endoscopy;  Laterality: N/A;  . Tonsillectomy and adenoidectomy    . Breast biopsy      "several"  . Cholecystectomy    . Wisdom tooth extraction    . Finger surgery      mass removed  . Gastrectomy N/A 05/08/2012    Procedure: Excision gastric mass, possible partial gastrectomy;  Surgeon: Shelly Rubenstein, MD;  Location: [facility] OR;  Service: General;  Laterality: N/A;    Levon Hedger MS, RD, LDN [phone] Pager [phone] After Hours Pager

## 2013-01-06 NOTE — Progress Notes (Signed)
Patient ID: [name], female   DOB: 02-23-1955, 57 y.o.   MRN: [id number]  She is reporting less pain this afternoon and generally looks better.  Her WBC was actually down. On exam, she is afebrile Her abdomen seems appropriately tender for such a large hernia repair  Will get her up in a chair Allow liquids Adjust pain meds Add Reglan PT consult

## 2013-01-07 LAB — CBC
HCT: 24.3 % — ABNORMAL LOW (ref 36.0–46.0)
Hemoglobin: 8 g/dL — ABNORMAL LOW (ref 12.0–15.0)
MCHC: 32.9 g/dL (ref 30.0–36.0)
MCV: 96.8 fL (ref 78.0–100.0)
RDW: 14 % (ref 11.5–15.5)
WBC: 9 10*3/uL (ref 4.0–10.5)

## 2013-01-07 NOTE — Progress Notes (Signed)
Patient has voided 3 times, since foley was removed.  Philomena Doheny RN

## 2013-01-07 NOTE — Evaluation (Signed)
Physical Therapy Evaluation Patient Details Name: [name] MRN: [id number] DOB: 02-Mar-1955 Today's Date: 01/07/2013 Time: 7829-5621 PT Time Calculation (min): 17 min  PT Assessment / Plan / Recommendation History of Present Illness  She is status post exploratory laparotomy for a carcinoid tumor of the stomach. She now has a ventral incisional hernia which is symptomatic. She has discomfort but no nausea or vomiting. She is otherwise without complaints  Clinical Impression  Pt ambulated x 30 ', min c/o pain. Pt will benefit from PT to address  Problems listed.    PT Assessment  Patient needs continued PT services    Follow Up Recommendations  Home health PT (may not need it)    Does the patient have the potential to tolerate intense rehabilitation      Barriers to Discharge        Equipment Recommendations  Other (comment) (TBD)    Recommendations for Other Services     Frequency Min 3X/week    Precautions / Restrictions Precautions Precautions: Fall   Pertinent Vitals/Pain 2-3 abd.  sats 94 RA after amb.      Mobility  Bed Mobility Bed Mobility: Rolling Left;Left Sidelying to Sit;Sitting - Scoot to [place] of Bed Rolling Left: 4: Min assist Left Sidelying to Sit: 4: Min assist;With rails Sitting - Scoot to [place] of Bed: 4: Min assist;With rail Details for Bed Mobility Assistance: cues for protecting abdomen, rooling is optimal  Transfers Transfers: Sit to Stand;Stand to Sit Sit to Stand: 4: Min assist;From bed;From elevated surface Stand to Sit: 4: Min guard;To chair/3-in-1;With upper extremity assist Details for Transfer Assistance: cues for safe transitions. Ambulation/Gait Ambulation/Gait Assistance: 1: +2 Total assist Ambulation/Gait: Patient Percentage: 90% (folloed with chair) Ambulation Distance (Feet): 30 Feet Assistive device: Rolling walker Ambulation/Gait Assistance Details: cues for sequence, posture. Gait Pattern: Step-through pattern Gait  velocity: slow, extra time General Gait Details: noted dyspnea 2-3    Exercises     PT Diagnosis: Difficulty walking;Generalized weakness  PT Problem List: Decreased strength;Decreased activity tolerance;Decreased mobility;Decreased knowledge of use of DME PT Treatment Interventions: DME instruction;Gait training;Functional mobility training;Therapeutic activities;Patient/family education     PT Goals(Current goals can be found in the care plan section) Acute Rehab PT Goals Patient Stated Goal: I want to move around more PT Goal Formulation: With patient/family Time For Goal Achievement: 01/21/13 Potential to Achieve Goals: Good  Visit Information  Last PT Received On: 01/07/13 Assistance Needed: +1 History of Present Illness: She is status post exploratory laparotomy for a carcinoid tumor of the stomach. She now has a ventral incisional hernia which is symptomatic. She has discomfort but no nausea or vomiting. She is otherwise without complaints       Prior Functioning  Home Living Family/patient expects to be discharged to:: Private residence Available Help at Discharge: Family Type of Home: Aeronautical engineer of Steps: 2 Home Layout: One level Home Equipment: None Prior Function Level of Independence: Independent Communication Communication: No difficulties    Cognition  Cognition Arousal/Alertness: Awake/alert Behavior During Therapy: WFL for tasks assessed/performed Overall Cognitive Status: Within Functional Limits for tasks assessed    Extremity/Trunk Assessment Upper Extremity Assessment Upper Extremity Assessment: Generalized weakness Lower Extremity Assessment Lower Extremity Assessment: Generalized weakness   Balance Balance Balance Assessed: Yes Static Standing Balance Static Standing - Balance Support: Bilateral upper extremity supported Static Standing - Level of Assistance: 5: Stand by assistance  End of Session PT - End of  Session Activity Tolerance: Patient limited by fatigue Patient  left: in chair;with call bell/phone within reach;with family/visitor present Nurse Communication: Mobility status  GP     Rada Hay 01/07/2013, 9:32 AM Blanchard Kelch PT [phone]

## 2013-01-07 NOTE — Progress Notes (Signed)
4 Days Post-Op  Subjective: Floor has not been getting patient up.  She is feeling better and having much less pain.  Nausea resolved, passing flatus  Objective: Vital signs in last 24 hours: Temp:  [98.1 F (36.7 C)-98.9 F (37.2 C)] 98.9 F (37.2 C) (11/25 0600) Pulse Rate:  [81-89] 88 (11/25 0600) Resp:  [16-20] 20 (11/25 0600) BP: (114-132)/(65-78) 132/70 mmHg (11/25 0600) SpO2:  [94 %-98 %] 98 % (11/25 0600) Last BM Date: 01/02/13  Intake/Output from previous day: 11/24 0701 - 11/25 0700 In: 5760 [P.O.:360; I.V.:4700; IV Piggyback:700] Out: 4750 [Urine:4750] Intake/Output this shift:    Looks more comfortable Abdomen soft, much less tender  Lab Results:   Recent Labs  01/06/13 0800 01/07/13 0450  WBC 12.7* 9.0  HGB 8.3* 8.0*  HCT 24.4* 24.3*  PLT 162 168   BMET  Recent Labs  01/06/13 0800  NA 137  K 3.1*  CL 99  CO2 31  GLUCOSE 122*  BUN 8  CREATININE 0.67  CALCIUM 9.4   PT/INR No results found for this basename: LABPROT, INR,  in the last 72 hours ABG No results found for this basename: PHART, PCO2, PO2, HCO3,  in the last 72 hours  Studies/Results: Dg Abd Portable 1v  01/06/2013   CLINICAL DATA:  Postop hernia repair, now with right-sided abdominal pain  EXAM: PORTABLE ABDOMEN - 1 VIEW  COMPARISON:  CT abdomen and pelvis- 10/11/2012  FINDINGS: Examination is degraded due to patient body habitus and portable technique.  Nonobstructive bowel gas pattern. No supine evidence of pneumoperitoneum. No pneumatosis or portal venous gas.  Multiple surgical clips overlie the midline of the upper abdomen.  Several phleboliths overlie the lower pelvis. No definite abnormal intra-abdominal calcifications.  No acute osseus abnormalities.  IMPRESSION: Degraded examination without definite evidence of obstruction.   Electronically Signed   By: Simonne Come M.D.   On: 01/06/2013 07:52    Anti-infectives: Anti-infectives   Start     Dose/Rate Route Frequency Ordered  Stop   01/06/13 0800  ciprofloxacin (CIPRO) IVPB 400 mg     400 mg 200 mL/hr over 60 Minutes Intravenous Every 12 hours 01/06/13 0707     01/03/13 2200  ciprofloxacin (CIPRO) IVPB 400 mg     400 mg 200 mL/hr over 60 Minutes Intravenous Every 12 hours 01/03/13 1440 01/03/13 2246   01/03/13 0826  ciprofloxacin (CIPRO) IVPB 400 mg     400 mg 200 mL/hr over 60 Minutes Intravenous On call to O.R. 01/03/13 0826 01/03/13 1032      Assessment/Plan: s/p Procedure(s): LAPAROSCOPIC INCISIONAL HERNIA (N/A) INSERTION OF MESH (N/A)  Advance diet D/c foley ambulate  LOS: 4 days    Anavey Coombes A 01/07/2013

## 2013-01-08 LAB — TYPE AND SCREEN
ABO/RH(D): A POS
Antibody Screen: NEGATIVE
Donor AG Type: NEGATIVE
Donor AG Type: NEGATIVE
Unit division: 0

## 2013-01-08 MED ORDER — OXYCODONE-ACETAMINOPHEN 10-325 MG PO TABS: 1.0000 | ORAL_TABLET | ORAL | Status: DC | PRN

## 2013-01-08 MED ORDER — CIPROFLOXACIN HCL 500 MG PO TABS: 500.0000 mg | ORAL_TABLET | Freq: Two times a day (BID) | ORAL | Status: DC

## 2013-01-08 NOTE — Discharge Summary (Signed)
Physician Discharge Summary  Patient ID: [name] MRN: [id number] DOB/AGE: Feb 09, 1956 57 y.o.  Admit date: 01/03/2013 Discharge date: 01/08/2013  Admission Diagnoses:  Discharge Diagnoses:  Active Problems:   * No active hospital problems. * incisional hernia Postop blood loss anemia obesity  Discharged Condition: good  Hospital Course: admitted post op. Hgh drifted down consistent with post op blood loss.  Had moderate pain post op.  Slowly improved during post op course.  By POD#5 was ready for discharge having minimal pain  Consults: None  Significant Diagnostic Studies:   Treatments: surgery: laparoscopic incisional hernia repair with mesh  Discharge Exam: Blood pressure 116/72, pulse 89, temperature 98.3 F (36.8 C), temperature source Oral, resp. rate 20, height 5\' 2"  (1.575 m), weight 196 lb 15.7 oz (89.35 kg), SpO2 99.00%. General appearance: alert, cooperative and no distress Resp: clear to auscultation bilaterally Incision/Wound: abdomen soft, minimally tender, incisions clean  Disposition: 01-Home or Self Care   Future Appointments Provider Department Dept Phone   01/24/2013 4:40 PM Shelly Rubenstein, MD [facility], [place] [phone]       Medication List         allopurinol 300 MG tablet  Commonly known as:  ZYLOPRIM  Take 300 mg by mouth every evening.     ALPRAZolam 0.5 MG tablet  Commonly known as:  XANAX  Take 0.5 mg by mouth 2 (two) times daily as needed for anxiety.     ciprofloxacin 500 MG tablet  Commonly known as:  CIPRO  Take 1 tablet (500 mg total) by mouth 2 (two) times daily.     cyclobenzaprine 10 MG tablet  Commonly known as:  FLEXERIL  Take 10 mg by mouth at bedtime.     esomeprazole 40 MG capsule  Commonly known as:  NEXIUM  Take 40 mg by mouth 2 (two) times daily.     furosemide 20 MG tablet  Commonly known as:  LASIX  Take 20 mg by mouth 2 (two) times daily.     losartan-hydrochlorothiazide 100-25  MG per tablet  Commonly known as:  HYZAAR  Take 0.5 tablets by mouth every morning.     oxyCODONE-acetaminophen 10-325 MG per tablet  Commonly known as:  PERCOCET  Take 1 tablet by mouth every 4 (four) hours as needed for pain.     potassium chloride SA 20 MEQ tablet  Commonly known as:  K-DUR,KLOR-CON  Take 20 mEq by mouth 2 (two) times daily.     sertraline 100 MG tablet  Commonly known as:  ZOLOFT  Take 100 mg by mouth every morning.           Follow-up Information   Follow up with [facility], MD In 2 weeks.   Specialty:  General Surgery   Contact information:   [address] [place] [address] [phone]       Signed: Shelly Rubenstein 01/08/2013, 6:28 AM

## 2013-01-08 NOTE — Progress Notes (Signed)
Patient given all discharge instructions, with a verbalized understanding of all discharge instructions.  Patient agreeable to discharge plan, and discharged in stable medical condition.  Patient's husband to transport patient home.  Philomena Doheny RN

## 2013-01-08 NOTE — Anesthesia Postprocedure Evaluation (Signed)
  Anesthesia Post-op Note  Patient: [name]  Procedure(s) Performed: Procedure(s) (LRB): LAPAROSCOPIC INCISIONAL HERNIA (N/A) INSERTION OF MESH (N/A)  Patient Location: PACU  Anesthesia Type: General  Level of Consciousness: awake and alert   Airway and Oxygen Therapy: Patient Spontanous Breathing  Post-op Pain: mild  Post-op Assessment: Post-op Vital signs reviewed, Patient's Cardiovascular Status Stable, Respiratory Function Stable, Patent Airway and No signs of Nausea or vomiting  Last Vitals:  Filed Vitals:   01/08/13 0600  BP: 129/76  Pulse: 81  Temp: 36.4 C  Resp: 20    Post-op Vital Signs: stable   Complications: No apparent anesthesia complications

## 2013-01-08 NOTE — Progress Notes (Signed)
!!  p to 7 a; Pt resting comfortably all night long.  No requests for pain medicine.Husband at the bedside. All assesments WNL. Jamilah Jean, Doran Durand, RN

## 2013-01-08 NOTE — Progress Notes (Signed)
Patient ID: [name], female   DOB: [birth year], 56 y.o.   MRN: [id number]  She has continued to improve.  Pain is minimal.  Tolerating po.  Wants to go home  Abdomen is soft and minimally tender  Plan:  discharge

## 2013-01-24 ENCOUNTER — Ambulatory Visit (INDEPENDENT_AMBULATORY_CARE_PROVIDER_SITE_OTHER): Payer: Commercial Managed Care - PPO | Admitting: Surgery

## 2013-01-24 ENCOUNTER — Encounter (INDEPENDENT_AMBULATORY_CARE_PROVIDER_SITE_OTHER): Payer: Self-pay | Admitting: Surgery

## 2013-01-24 VITALS — BP 128/80 | HR 88 | Temp 98.9°F | Resp 15 | Ht 62.0 in | Wt 192.8 lb

## 2013-01-24 DIAGNOSIS — Z09 Encounter for follow-up examination after completed treatment for conditions other than malignant neoplasm: Secondary | ICD-10-CM

## 2013-01-24 MED ORDER — OXYCODONE-ACETAMINOPHEN 10-325 MG PO TABS: 1.0000 | ORAL_TABLET | ORAL | Status: DC | PRN

## 2013-01-24 NOTE — Progress Notes (Signed)
Subjective:     Patient ID: [name], female   DOB: Aug 07, 1955, 57 y.o.   MRN: [id number]  HPI She is here for her first postoperative visit status post laparoscopic incisional hernia repair with mesh. Other than constipation, she is doing moderately well.  Review of Systems     Objective:   Physical Exam On exam, her incisions are well healed and there is no evidence of recurrent hernia. Her abdomen is minimally tender    Assessment:     Patient stable postop     Plan:     I will renew her Percocet. She has already gone back to work. She will continue to refrain from heavy lifting. I will see her back in 3 weeks for recheck. She is going to try MiraLAX for constipation

## 2013-02-10 ENCOUNTER — Encounter (INDEPENDENT_AMBULATORY_CARE_PROVIDER_SITE_OTHER): Payer: Self-pay | Admitting: Surgery

## 2013-02-10 ENCOUNTER — Ambulatory Visit (INDEPENDENT_AMBULATORY_CARE_PROVIDER_SITE_OTHER): Payer: Commercial Managed Care - PPO | Admitting: Surgery

## 2013-02-10 VITALS — BP 125/84 | HR 72 | Temp 98.6°F | Resp 16 | Ht 62.0 in | Wt 191.0 lb

## 2013-02-10 DIAGNOSIS — Z09 Encounter for follow-up examination after completed treatment for conditions other than malignant neoplasm: Secondary | ICD-10-CM

## 2013-02-10 NOTE — Progress Notes (Signed)
Subjective:     Patient ID: [name], female   DOB: 20-Feb-1955, 57 y.o.   MRN: [id number]  HPI She is here for another postop visit. She has minimal discomfort now. It is most the left side.  Review of Systems     Objective:   Physical Exam On exam, she looks great. Her incisions are well healed and there is no evidence of recurrent hernia    Assessment:     Patient stable postop     Plan:     She may resume normal activities. I will see her back as needed

## 2013-03-03 ENCOUNTER — Encounter (INDEPENDENT_AMBULATORY_CARE_PROVIDER_SITE_OTHER): Payer: Self-pay | Admitting: General Surgery

## 2013-03-03 ENCOUNTER — Telehealth (INDEPENDENT_AMBULATORY_CARE_PROVIDER_SITE_OTHER): Payer: Self-pay | Admitting: *Deleted

## 2013-03-03 ENCOUNTER — Ambulatory Visit (INDEPENDENT_AMBULATORY_CARE_PROVIDER_SITE_OTHER): Payer: Commercial Managed Care - PPO | Admitting: General Surgery

## 2013-03-03 VITALS — BP 132/84 | HR 74 | Temp 98.6°F | Resp 14 | Ht 62.0 in | Wt 190.8 lb

## 2013-03-03 DIAGNOSIS — G8918 Other acute postprocedural pain: Secondary | ICD-10-CM

## 2013-03-03 NOTE — Telephone Encounter (Signed)
She needs to come to urgent office

## 2013-03-03 NOTE — Progress Notes (Signed)
Subjective:     Patient ID: [name], female   DOB: [birth year], 58 y.o.   MRN: [id number]  Abdominal Pain  58 year old Caucasian female status post laparoscopic ventral hernia repair by Dr. Ninfa Linden on November 21 comes in for ongoing discomfort in the left upper abdomen. She states that it particularly bothers her at night. She states that it causes her to have difficulty sleeping. She also feels that there may be a little bit of swelling at that site as well. She denies any fever, chills, nausea, vomiting, diarrhea. She takes a muscle relaxant daily. She also takes Percocet as well. She states that she can't take the Percocet at night because it hypes her up.  Review of Systems  Gastrointestinal: Positive for abdominal pain.       Objective:   Physical Exam  Abdominal:    Old scar in midline. Well healed trocar sites. Pain appears to be at LUQ transfascial site. No appreciable hernia    BP 132/84  Pulse 74  Temp(Src) 98.6 F (37 C) (Temporal)  Resp 14  Ht 5\' 2"  (1.575 m)  Wt 190 lb 12.8 oz (86.546 kg)  BMI 34.89 kg/m2 Alert, nad     Assessment:     Status post laparoscopic repair of ventral incisional hernia with mesh November 21 Postoperative pain     Plan:     I explained that I saw no signs of infection recurrent hernia. Explained that it appears to be at one of her transfascial suture sites. Explained that it is not uncommon to have long-term abdominal wall pain after this type of surgery. Explained that it should gradually improve with additional time. Patient is already on a muscle relaxant and narcotic. She is to followup next week with Dr. Ninfa Linden to talk about potential injection to the area  Leighton Ruff. Redmond Pulling, MD, FACS General, Bariatric, & Minimally Invasive Surgery [facility], [place]

## 2013-03-03 NOTE — Telephone Encounter (Signed)
Patient's husband Christia Reading called to ask about getting patient in to see Dr. Ninfa Linden.  He reports increased swelling and pain at the incision site.  Patient had a lap incisional hernia repair on 01/03/13.  Christia Reading states that he spoke to [place] previously and he was told she would speak with Deneise Lever then give him a call back however he hasn't heard anything.  Explained that I would speak with Deneise Lever today and we will call them back today with an answer.  He states understanding and agreeable with plan at this time.

## 2013-03-03 NOTE — Telephone Encounter (Signed)
Called and spoke to patient regarding Dr. Trevor Mace recommendation.  Patient states understanding and appt made for urgent office this afternoon.

## 2013-03-10 ENCOUNTER — Encounter (INDEPENDENT_AMBULATORY_CARE_PROVIDER_SITE_OTHER): Payer: Self-pay | Admitting: Surgery

## 2013-03-10 ENCOUNTER — Ambulatory Visit (INDEPENDENT_AMBULATORY_CARE_PROVIDER_SITE_OTHER): Payer: Commercial Managed Care - PPO | Admitting: Surgery

## 2013-03-10 VITALS — BP 140/90 | HR 88 | Temp 98.4°F | Resp 14 | Ht 62.0 in | Wt 191.8 lb

## 2013-03-10 DIAGNOSIS — G8918 Other acute postprocedural pain: Secondary | ICD-10-CM

## 2013-03-10 NOTE — Progress Notes (Signed)
Subjective:     Patient ID: [name], female   DOB: 12/24/1955, 58 y.o.   MRN: [id number]  HPI She is here for another postop visit. She is having pain at 1 specific spot in her left upper quadrant.  Review of Systems     Objective:   Physical Exam On exam there is no recurrent hernia. The area of tenderness is at one of the stay sutures. After discussing it with her, I performed a nerve block with Marcaine and lidocaine mixture. She tolerated it well    Assessment:     Patient stable postop     Plan:     I will see her back in 3 weeks for reevaluation. She will continue her current activity level

## 2013-03-31 ENCOUNTER — Encounter (INDEPENDENT_AMBULATORY_CARE_PROVIDER_SITE_OTHER): Payer: Commercial Managed Care - PPO | Admitting: Surgery

## 2013-04-04 ENCOUNTER — Ambulatory Visit (INDEPENDENT_AMBULATORY_CARE_PROVIDER_SITE_OTHER): Payer: Commercial Managed Care - PPO | Admitting: Surgery

## 2013-04-04 ENCOUNTER — Encounter (INDEPENDENT_AMBULATORY_CARE_PROVIDER_SITE_OTHER): Payer: Self-pay | Admitting: Surgery

## 2013-04-04 DIAGNOSIS — Z09 Encounter for follow-up examination after completed treatment for conditions other than malignant neoplasm: Secondary | ICD-10-CM

## 2013-04-04 NOTE — Progress Notes (Signed)
Subjective:     Patient ID: [name], female   DOB: 12/31/1955, 58 y.o.   MRN: [id number]  HPI She is here for another postop visit. She is still having pain at 2 of the incisions on her abdomen. This did improve after the injection of last visit  Review of Systems     Objective:   Physical Exam On exam, there is no evidence of recurrent hernia. She has pinpoint tenderness in 2 separate areas. I injected a mixture of Marcaine and lidocaine into these 2 areas    Assessment:     Patient stable postop     Plan:     She will resume her normal activity And I will see her back in one month

## 2013-05-01 ENCOUNTER — Encounter (INDEPENDENT_AMBULATORY_CARE_PROVIDER_SITE_OTHER): Payer: Self-pay | Admitting: Surgery

## 2013-05-01 ENCOUNTER — Ambulatory Visit (INDEPENDENT_AMBULATORY_CARE_PROVIDER_SITE_OTHER): Payer: Commercial Managed Care - PPO | Admitting: Surgery

## 2013-05-01 VITALS — BP 124/82 | HR 73 | Temp 99.8°F | Ht 62.0 in | Wt 192.0 lb

## 2013-05-01 DIAGNOSIS — G8918 Other acute postprocedural pain: Secondary | ICD-10-CM

## 2013-05-01 NOTE — Progress Notes (Signed)
Subjective:     Patient ID: [name], female   DOB: 1955-09-26, 58 y.o.   MRN: [id number]  HPI She is here for a postop visit. She has no complaints now has minimal discomfort her abdomen  Review of Systems     Objective:   Physical Exam On exam, her incisions are well-healed with no evidence recurrent hernia there is minimal tenderness    Assessment:     Patient stable status post left upper ventral hernia repair with mesh     Plan:     She may resume all normal activities. I will see her back as needed

## 2013-07-18 DIAGNOSIS — G894 Chronic pain syndrome: Secondary | ICD-10-CM | POA: Insufficient documentation

## 2013-07-18 DIAGNOSIS — Z5181 Encounter for therapeutic drug level monitoring: Secondary | ICD-10-CM | POA: Insufficient documentation

## 2013-07-24 ENCOUNTER — Other Ambulatory Visit: Payer: Self-pay | Admitting: Family Medicine

## 2013-07-24 DIAGNOSIS — Z1231 Encounter for screening mammogram for malignant neoplasm of breast: Secondary | ICD-10-CM

## 2013-08-01 ENCOUNTER — Ambulatory Visit: Payer: Commercial Managed Care - PPO

## 2013-08-05 ENCOUNTER — Ambulatory Visit
Admission: RE | Admit: 2013-08-05 | Discharge: 2013-08-05 | Disposition: A | Discharge status: Home / Self Care | Payer: Commercial Managed Care - PPO | Source: Ambulatory Visit | Attending: Family Medicine | Admitting: Family Medicine

## 2013-08-05 DIAGNOSIS — Z1231 Encounter for screening mammogram for malignant neoplasm of breast: Secondary | ICD-10-CM

## 2013-08-06 ENCOUNTER — Other Ambulatory Visit: Payer: Self-pay | Admitting: Family Medicine

## 2013-08-06 DIAGNOSIS — R928 Other abnormal and inconclusive findings on diagnostic imaging of breast: Secondary | ICD-10-CM

## 2013-08-21 ENCOUNTER — Ambulatory Visit
Admission: RE | Admit: 2013-08-21 | Discharge: 2013-08-21 | Disposition: A | Discharge status: Home / Self Care | Payer: Commercial Managed Care - PPO | Source: Ambulatory Visit | Attending: Family Medicine | Admitting: Family Medicine

## 2013-08-21 DIAGNOSIS — R928 Other abnormal and inconclusive findings on diagnostic imaging of breast: Secondary | ICD-10-CM

## 2013-09-11 ENCOUNTER — Encounter (HOSPITAL_BASED_OUTPATIENT_CLINIC_OR_DEPARTMENT_OTHER): Payer: Self-pay | Admitting: *Deleted

## 2013-09-11 NOTE — Progress Notes (Signed)
NPO AFTER MN WITH EXCEPTION CLEAR LIQUIDS UNTIL 0800 (NO CREAM/ MILK PRODUCTS).  ARRIVE AT 1230. NEEDS ISTAT AND EKG. WILL TAKE ZOLOFT, NEXIUM, AND VIT D AM DOS W/ SIPS OF WATER.

## 2013-09-12 DIAGNOSIS — M533 Sacrococcygeal disorders, not elsewhere classified: Secondary | ICD-10-CM | POA: Insufficient documentation

## 2013-09-12 DIAGNOSIS — M47812 Spondylosis without myelopathy or radiculopathy, cervical region: Secondary | ICD-10-CM | POA: Insufficient documentation

## 2013-09-18 ENCOUNTER — Encounter (HOSPITAL_BASED_OUTPATIENT_CLINIC_OR_DEPARTMENT_OTHER): Payer: Commercial Managed Care - PPO | Admitting: Anesthesiology

## 2013-09-18 ENCOUNTER — Encounter (HOSPITAL_BASED_OUTPATIENT_CLINIC_OR_DEPARTMENT_OTHER): Payer: Self-pay

## 2013-09-18 ENCOUNTER — Ambulatory Visit (HOSPITAL_BASED_OUTPATIENT_CLINIC_OR_DEPARTMENT_OTHER)
Admission: RE | Admit: 2013-09-18 | Discharge: 2013-09-18 | Disposition: A | Discharge status: Home / Self Care | Payer: Commercial Managed Care - PPO | Source: Ambulatory Visit | Attending: Orthopedic Surgery | Admitting: Orthopedic Surgery

## 2013-09-18 ENCOUNTER — Encounter (HOSPITAL_BASED_OUTPATIENT_CLINIC_OR_DEPARTMENT_OTHER)
Admission: RE | Disposition: A | Discharge status: Home / Self Care | Payer: Self-pay | Source: Ambulatory Visit | Attending: Orthopedic Surgery

## 2013-09-18 ENCOUNTER — Ambulatory Visit (HOSPITAL_BASED_OUTPATIENT_CLINIC_OR_DEPARTMENT_OTHER): Payer: Commercial Managed Care - PPO | Admitting: Anesthesiology

## 2013-09-18 DIAGNOSIS — F329 Major depressive disorder, single episode, unspecified: Secondary | ICD-10-CM | POA: Insufficient documentation

## 2013-09-18 DIAGNOSIS — K219 Gastro-esophageal reflux disease without esophagitis: Secondary | ICD-10-CM | POA: Diagnosis not present

## 2013-09-18 DIAGNOSIS — M720 Palmar fascial fibromatosis [Dupuytren]: Secondary | ICD-10-CM | POA: Diagnosis not present

## 2013-09-18 DIAGNOSIS — Z79899 Other long term (current) drug therapy: Secondary | ICD-10-CM | POA: Insufficient documentation

## 2013-09-18 DIAGNOSIS — Z85528 Personal history of other malignant neoplasm of kidney: Secondary | ICD-10-CM | POA: Diagnosis not present

## 2013-09-18 DIAGNOSIS — M109 Gout, unspecified: Secondary | ICD-10-CM | POA: Diagnosis not present

## 2013-09-18 DIAGNOSIS — Z905 Acquired absence of kidney: Secondary | ICD-10-CM | POA: Insufficient documentation

## 2013-09-18 DIAGNOSIS — F3289 Other specified depressive episodes: Secondary | ICD-10-CM | POA: Insufficient documentation

## 2013-09-18 DIAGNOSIS — I10 Essential (primary) hypertension: Secondary | ICD-10-CM | POA: Diagnosis not present

## 2013-09-18 DIAGNOSIS — R2232 Localized swelling, mass and lump, left upper limb: Secondary | ICD-10-CM

## 2013-09-18 DIAGNOSIS — R229 Localized swelling, mass and lump, unspecified: Secondary | ICD-10-CM | POA: Diagnosis present

## 2013-09-18 HISTORY — DX: Personal history of malignant carcinoid tumor of stomach: Z85.020

## 2013-09-18 HISTORY — DX: Personal history of adenomatous and serrated colon polyps: Z86.0101

## 2013-09-18 HISTORY — DX: Presence of spectacles and contact lenses: Z97.3

## 2013-09-18 HISTORY — DX: Personal history of other malignant neoplasm of kidney: Z85.528

## 2013-09-18 HISTORY — DX: Other pancytopenia: D61.818

## 2013-09-18 HISTORY — DX: Localized swelling, mass and lump, left upper limb: R22.32

## 2013-09-18 HISTORY — PX: MASS EXCISION: SHX2000

## 2013-09-18 HISTORY — DX: Preglaucoma, unspecified, unspecified eye: H40.009

## 2013-09-18 HISTORY — DX: Personal history of other (healed) physical injury and trauma: Z87.828

## 2013-09-18 HISTORY — DX: Anemia, unspecified: D64.9

## 2013-09-18 HISTORY — DX: Inflammatory polyarthropathy: M06.4

## 2013-09-18 HISTORY — DX: Personal history of colon polyps: Z86.010

## 2013-09-18 LAB — POCT I-STAT 4, (NA,K, GLUC, HGB,HCT)
GLUCOSE: 96 mg/dL (ref 70–99)
HEMATOCRIT: 35 % — AB (ref 36.0–46.0)
HEMOGLOBIN: 11.9 g/dL — AB (ref 12.0–15.0)
Potassium: 3.1 mEq/L — ABNORMAL LOW (ref 3.7–5.3)
Sodium: 140 mEq/L (ref 137–147)

## 2013-09-18 SURGERY — EXCISION MASS
Anesthesia: Monitor Anesthesia Care | Site: Hand | Laterality: Left

## 2013-09-18 MED ORDER — ONDANSETRON HCL 4 MG/2ML IJ SOLN
INTRAMUSCULAR | Status: DC | PRN
  Administered 2013-09-18: 4 mg via INTRAVENOUS

## 2013-09-18 MED ORDER — PROMETHAZINE HCL 25 MG/ML IJ SOLN
6.2500 mg | INTRAMUSCULAR | Status: DC | PRN
  Filled 2013-09-18: qty 1

## 2013-09-18 MED ORDER — LIDOCAINE HCL (PF) 1 % IJ SOLN
INTRAMUSCULAR | Status: DC | PRN
  Administered 2013-09-18: 7.5 mL

## 2013-09-18 MED ORDER — CLINDAMYCIN PHOSPHATE 900 MG/50ML IV SOLN
INTRAVENOUS | Status: AC
  Filled 2013-09-18: qty 50

## 2013-09-18 MED ORDER — HYDROMORPHONE HCL PF 1 MG/ML IJ SOLN
0.2500 mg | INTRAMUSCULAR | Status: DC | PRN
  Filled 2013-09-18: qty 1

## 2013-09-18 MED ORDER — FENTANYL CITRATE 0.05 MG/ML IJ SOLN
INTRAMUSCULAR | Status: AC
  Filled 2013-09-18: qty 4

## 2013-09-18 MED ORDER — MEPERIDINE HCL 25 MG/ML IJ SOLN
6.2500 mg | INTRAMUSCULAR | Status: DC | PRN
  Filled 2013-09-18: qty 1

## 2013-09-18 MED ORDER — MIDAZOLAM HCL 2 MG/2ML IJ SOLN
INTRAMUSCULAR | Status: AC
  Filled 2013-09-18: qty 2

## 2013-09-18 MED ORDER — PROPOFOL 10 MG/ML IV BOLUS
INTRAVENOUS | Status: DC | PRN
  Administered 2013-09-18: 30 mg via INTRAVENOUS

## 2013-09-18 MED ORDER — CHLORHEXIDINE GLUCONATE 4 % EX LIQD
60.0000 mL | Freq: Once | CUTANEOUS | Status: DC
  Filled 2013-09-18: qty 60

## 2013-09-18 MED ORDER — DEXAMETHASONE SODIUM PHOSPHATE 10 MG/ML IJ SOLN
INTRAMUSCULAR | Status: DC | PRN
  Administered 2013-09-18: 10 mg via INTRAVENOUS

## 2013-09-18 MED ORDER — 0.9 % SODIUM CHLORIDE (POUR BTL) OPTIME
TOPICAL | Status: DC | PRN
  Administered 2013-09-18: 1000 mL

## 2013-09-18 MED ORDER — BUPIVACAINE HCL (PF) 0.25 % IJ SOLN
INTRAMUSCULAR | Status: DC | PRN
  Administered 2013-09-18: 7.5 mL

## 2013-09-18 MED ORDER — OXYCODONE HCL 5 MG/5ML PO SOLN
5.0000 mg | Freq: Once | ORAL | Status: DC | PRN
  Filled 2013-09-18: qty 5

## 2013-09-18 MED ORDER — OXYCODONE HCL 5 MG PO TABS
5.0000 mg | ORAL_TABLET | Freq: Once | ORAL | Status: DC | PRN
  Filled 2013-09-18: qty 1

## 2013-09-18 MED ORDER — LIDOCAINE HCL (CARDIAC) 20 MG/ML IV SOLN
INTRAVENOUS | Status: DC | PRN
  Administered 2013-09-18: 50 mg via INTRAVENOUS

## 2013-09-18 MED ORDER — FENTANYL CITRATE 0.05 MG/ML IJ SOLN
INTRAMUSCULAR | Status: DC | PRN
  Administered 2013-09-18: 50 ug via INTRAVENOUS

## 2013-09-18 MED ORDER — PROPOFOL INFUSION 10 MG/ML OPTIME
INTRAVENOUS | Status: DC | PRN
  Administered 2013-09-18: 140 ug/kg/min via INTRAVENOUS

## 2013-09-18 MED ORDER — LACTATED RINGERS IV SOLN
INTRAVENOUS | Status: DC
  Administered 2013-09-18 (×2): via INTRAVENOUS
  Filled 2013-09-18: qty 1000

## 2013-09-18 MED ORDER — CLINDAMYCIN PHOSPHATE 900 MG/50ML IV SOLN
900.0000 mg | INTRAVENOUS | Status: AC
  Administered 2013-09-18: 900 mg via INTRAVENOUS
  Filled 2013-09-18: qty 50

## 2013-09-18 MED ORDER — MIDAZOLAM HCL 5 MG/5ML IJ SOLN
INTRAMUSCULAR | Status: DC | PRN
  Administered 2013-09-18: 2 mg via INTRAVENOUS

## 2013-09-18 MED ORDER — HYDROCODONE-ACETAMINOPHEN 5-300 MG PO TABS: 1.0000 | ORAL_TABLET | Freq: Four times a day (QID) | ORAL | Status: DC | PRN

## 2013-09-18 SURGICAL SUPPLY — 64 items
APL SKNCLS STERI-STRIP NONHPOA (GAUZE/BANDAGES/DRESSINGS)
BANDAGE ELASTIC 3 VELCRO ST LF (GAUZE/BANDAGES/DRESSINGS) ×1 IMPLANT
BANDAGE ELASTIC 4 VELCRO ST LF (GAUZE/BANDAGES/DRESSINGS) IMPLANT
BENZOIN TINCTURE PRP APPL 2/3 (GAUZE/BANDAGES/DRESSINGS) IMPLANT
BLADE MINI RND TIP GREEN BEAV (BLADE) IMPLANT
BLADE SURG 15 STRL LF DISP TIS (BLADE) ×1 IMPLANT
BLADE SURG 15 STRL SS (BLADE) ×2
BNDG CMPR 9X4 STRL LF SNTH (GAUZE/BANDAGES/DRESSINGS) ×1
BNDG ESMARK 4X9 LF (GAUZE/BANDAGES/DRESSINGS) ×1 IMPLANT
BNDG GAUZE ELAST 4 BULKY (GAUZE/BANDAGES/DRESSINGS) ×1 IMPLANT
CORDS BIPOLAR (ELECTRODE) ×2 IMPLANT
COVER MAYO STAND STRL (DRAPES) ×1 IMPLANT
COVER TABLE BACK 60X90 (DRAPES) ×2 IMPLANT
CUFF TOURNIQUET SINGLE 18IN (TOURNIQUET CUFF) ×1 IMPLANT
DECANTER SPIKE VIAL GLASS SM (MISCELLANEOUS) IMPLANT
DRAIN PENROSE 18X1/2 LTX STRL (DRAIN) IMPLANT
DRAPE EXTREMITY T 121X128X90 (DRAPE) ×2 IMPLANT
DRAPE SURG 17X23 STRL (DRAPES) ×2 IMPLANT
DRSG EMULSION OIL 3X3 NADH (GAUZE/BANDAGES/DRESSINGS) ×1 IMPLANT
GAUZE XEROFORM 1X8 LF (GAUZE/BANDAGES/DRESSINGS) ×1 IMPLANT
GLOVE BIOGEL PI IND STRL 7.0 (GLOVE) IMPLANT
GLOVE BIOGEL PI IND STRL 7.5 (GLOVE) IMPLANT
GLOVE BIOGEL PI IND STRL 8.5 (GLOVE) ×1 IMPLANT
GLOVE BIOGEL PI INDICATOR 7.0 (GLOVE) ×1
GLOVE BIOGEL PI INDICATOR 7.5 (GLOVE) ×2
GLOVE BIOGEL PI INDICATOR 8.5 (GLOVE) ×1
GLOVE SURG ORTHO 8.0 STRL STRW (GLOVE) ×2 IMPLANT
GOWN STRL REUS W/ TWL LRG LVL3 (GOWN DISPOSABLE) ×1 IMPLANT
GOWN STRL REUS W/ TWL XL LVL3 (GOWN DISPOSABLE) ×1 IMPLANT
GOWN STRL REUS W/TWL LRG LVL3 (GOWN DISPOSABLE)
GOWN STRL REUS W/TWL XL LVL3 (GOWN DISPOSABLE) ×2 IMPLANT
NDL HYPO 25X1 1.5 SAFETY (NEEDLE) IMPLANT
NDL SAFETY ECLIPSE 18X1.5 (NEEDLE) IMPLANT
NEEDLE 27GAX1X1/2 (NEEDLE) IMPLANT
NEEDLE HYPO 18GX1.5 SHARP (NEEDLE) ×2
NEEDLE HYPO 25X1 1.5 SAFETY (NEEDLE) ×2 IMPLANT
NS IRRIG 500ML POUR BTL (IV SOLUTION) ×2 IMPLANT
PACK BASIN DAY SURGERY FS (CUSTOM PROCEDURE TRAY) ×2 IMPLANT
PAD CAST 3X4 CTTN HI CHSV (CAST SUPPLIES) IMPLANT
PAD CAST 4YDX4 CTTN HI CHSV (CAST SUPPLIES) IMPLANT
PADDING CAST ABS 4INX4YD NS (CAST SUPPLIES)
PADDING CAST ABS COTTON 4X4 ST (CAST SUPPLIES) ×1 IMPLANT
PADDING CAST COTTON 3X4 STRL (CAST SUPPLIES)
PADDING CAST COTTON 4X4 STRL (CAST SUPPLIES)
SOAP 2% CHG 32OZ (WOUND CARE) ×2 IMPLANT
SPLINT FIBERGLASS 3X35 (CAST SUPPLIES) IMPLANT
SPLINT FIBERGLASS 4X30 (CAST SUPPLIES) IMPLANT
SPLINT PLASTER CAST XFAST 4X15 (CAST SUPPLIES) IMPLANT
SPLINT PLASTER XTRA FAST SET 4 (CAST SUPPLIES)
STOCKINETTE 4X48 STRL (DRAPES) ×2 IMPLANT
STRIP CLOSURE SKIN 1/2X4 (GAUZE/BANDAGES/DRESSINGS) IMPLANT
SUCTION FRAZIER TIP 10 FR DISP (SUCTIONS) IMPLANT
SUT ETHILON 5 0 P 3 18 (SUTURE)
SUT MNCRL AB 3-0 PS2 18 (SUTURE) IMPLANT
SUT MNCRL AB 4-0 PS2 18 (SUTURE) IMPLANT
SUT NYLON ETHILON 5-0 P-3 1X18 (SUTURE) ×1 IMPLANT
SUT PROLENE 4 0 PS 2 18 (SUTURE) ×2 IMPLANT
SYR BULB 3OZ (MISCELLANEOUS) ×2 IMPLANT
SYR CONTROL 10ML LL (SYRINGE) IMPLANT
SYRINGE 20CC LL (MISCELLANEOUS) ×1 IMPLANT
TOWEL OR 17X24 6PK STRL BLUE (TOWEL DISPOSABLE) ×4 IMPLANT
TRAY DSU PREP LF (CUSTOM PROCEDURE TRAY) ×2 IMPLANT
TUBE CONNECTING 12X1/4 (SUCTIONS) IMPLANT
UNDERPAD 30X30 INCONTINENT (UNDERPADS AND DIAPERS) ×2 IMPLANT

## 2013-09-18 NOTE — Op Note (Deleted)
[name], [name] NO.:  [id number]  MEDICAL RECORD NO.:  [id number]  LOCATION:                               FACILITY:  [facility]  PHYSICIAN:  Melrose Nakayama, MD  DATE OF BIRTH:  1956-01-15  DATE OF PROCEDURE: DATE OF DISCHARGE:  09/18/2013                              OPERATIVE REPORT   PREOPERATIVE DIAGNOSIS:  Left hand volar mass.  POSTOPERATIVE DIAGNOSIS:  Left hand volar mass.  ATTENDING PHYSICIAN:  Melrose Nakayama, MD, who scrubbed and present for the entire procedure.  ASSISTANT SURGEON:  None.  ANESTHESIA:  Xylocaine with 0.25% Marcaine, local block.  SURGICAL PROCEDURE:  Left hand partial palmar fasciectomy.  SPECIMEN:  Surgical volar mass to pathology.  SURGICAL INDICATIONS:  Ms. Glade is a right-hand-dominant female with enlarging mass over the volar aspect of the left hand that was painful, likely to undergo the above procedure.  Risks, benefits, and alternatives were discussed in detail with the patient and signed informed consent was obtained.  Risks include, but not limited to bleeding, infection, damage to nearby nerves, arteries, or tendons, loss of motion of wrist and digits, incomplete relief of symptoms, and need for further surgical intervention.  DESCRIPTION OF PROCEDURE:  The patient was properly identified in the preop holding area and marked with a permanent marker made on left hand to indicate the correct operative site.  The patient was then brought back to the operating room, placed supine on anesthesia table, and IV sedation was administered.  The patient tolerated this well.  A well- padded tourniquet was placed on the left forearm and sealed with 1000- drape.  Local anesthetic was administered.  The patient tolerated this well.  The patient's upper extremity was then prepped and draped in normal sterile fashion.  Time-out was called.  The correct site was identified, and procedure then begun.  Attention then turned  to the left hand where a small Bruner incision made directly over the mid palm region.  Dissection was carried down through the skin and subcutaneous tissue.  The tourniquet insufflated.  Deep dissection carried down to the palmar fascia where the circumferential dissection was carried around the palmar fascia and partial palmar fasciectomy was then carried out.  Careful protection of the neurovascular bundles to the ring and long finger were then carefully protected and the mass was then excised. After partial palmar fasciectomy, the wound was then thoroughly irrigated.  Tourniquet deflated.  Hemostasis was obtained.  The skin was then closed using simple Prolene sutures.  Adaptic dressing, sterile compressive bandage applied.  The patient tolerated the procedure well, returned to recovery room in good condition.  POSTPROCEDURE PLAN:  The patient was discharged home, seen back in the office in approximately 10 days for wound check, suture removal.  Go over Pathology.  Gradual use and activity.  PATHOLOGY:  Volar mass to pathology.     Melrose Nakayama, MD     FWO/MEDQ  D:  09/18/2013  T:  09/18/2013  Job:  [id number]

## 2013-09-18 NOTE — Anesthesia Preprocedure Evaluation (Addendum)
Anesthesia Evaluation  Patient identified by MRN, date of birth, ID band Patient awake    Reviewed: Allergy & Precautions, H&P , NPO status , Patient's Chart, lab work & pertinent test results  Airway Mallampati: II TM Distance: >3 FB Neck ROM: Full    Dental no notable dental hx. (+) Teeth Intact, Dental Advisory Given   Pulmonary neg pulmonary ROS,  breath sounds clear to auscultation  Pulmonary exam normal       Cardiovascular Exercise Tolerance: Poor hypertension, Pt. on medications Rhythm:Regular Rate:Normal     Neuro/Psych PSYCHIATRIC DISORDERS Depression negative neurological ROS     GI/Hepatic GERD-  Medicated and Controlled,(+) Hepatitis -, Autoimmune  Endo/Other  negative endocrine ROS  Renal/GU Renal disease     Musculoskeletal  (+) Arthritis -,   Abdominal   Peds  Hematology  (+) anemia ,   Anesthesia Other Findings   Reproductive/Obstetrics negative OB ROS                        Anesthesia Physical Anesthesia Plan  ASA: III  Anesthesia Plan: MAC   Post-op Pain Management:    Induction: Intravenous  Airway Management Planned:   Additional Equipment:   Intra-op Plan:   Post-operative Plan:   Informed Consent: I have reviewed the patients History and Physical, chart, labs and discussed the procedure including the risks, benefits and alternatives for the proposed anesthesia with the patient or authorized representative who has indicated his/her understanding and acceptance.   Dental advisory given  Plan Discussed with: CRNA and Anesthesiologist  Anesthesia Plan Comments:       Anesthesia Quick Evaluation
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
## 2013-09-18 NOTE — Anesthesia Procedure Notes (Signed)
Procedure Name: MAC Date/Time: 09/18/2013 2:58 PM Performed by: Wanita Chamberlain Pre-anesthesia Checklist: Patient identified, Timeout performed, Emergency Drugs available, Suction available and Patient being monitored Patient Re-evaluated:Patient Re-evaluated prior to inductionOxygen Delivery Method: Nasal cannula Intubation Type: IV induction Placement Confirmation: breath sounds checked- equal and bilateral

## 2013-09-18 NOTE — Discharge Instructions (Signed)
KEEP BANDAGE CLEAN AND DRY CALL OFFICE FOR F/U APPT [phone] KEEP HAND ELEVATED ABOVE HEART OK TO APPLY ICE TO OPERATIVE AREA CONTACT OFFICE IF ANY WORSENING PAIN OR CONCERNS.     Post Anesthesia Home Care Instructions  Activity: Get plenty of rest for the remainder of the day. A responsible adult should stay with you for 24 hours following the procedure.  For the next 24 hours, DO NOT: -Drive a car -Paediatric nurse -Drink alcoholic beverages -Take any medication unless instructed by your physician -Make any legal decisions or sign important papers.  Meals: Start with liquid foods such as gelatin or soup. Progress to regular foods as tolerated. Avoid greasy, spicy, heavy foods. If nausea and/or vomiting occur, drink only clear liquids until the nausea and/or vomiting subsides. Call your physician if vomiting continues.  Special Instructions/Symptoms: Your throat may feel dry or sore from the anesthesia or the breathing tube placed in your throat during surgery. If this causes discomfort, gargle with warm salt water. The discomfort should disappear within 24 hours.    Cyst Removal Your caregiver has removed a cyst. A cyst is a sac containing a semi-solid material. Cysts may occur any place on your body. They may remain small for years or gradually get larger. A sebaceous cyst is an enlarged (dilated) sweat gland filled with old sweat (sebum). Unattended, these may become large (the size of a softball) over several years time. These are often removed for improved appearance (cosmetic) reasons or before they become infected to form an abscess. An abscess is an infected cyst. HOME CARE INSTRUCTIONS   Keep your bandage clean and dry. You may change your bandage after 24 hours. If your bandage sticks, use warm water to gently loosen it. Pat the area dry with a clean towel before putting on another bandage.  If possible, keep the area where the cyst was removed raised to relieve  soreness, swelling, and promote healing.  If you have stitches, keep them clean and dry.  You may clean your stitches gently with a cotton swab dipped in warm soapy water.  Do not soak the area where the cyst was removed or go swimming. You may shower.  Do not overuse the area where your cyst was removed.  Return in 7 days or as directed to have your stitches removed.  Take medicines as instructed by your caregiver. SEEK IMMEDIATE MEDICAL CARE IF:   An oral temperature above 102 F (38.9 C) develops, not controlled by medication.  Blood continues to soak through the bandage.  You have increasing pain in the area where your cyst was removed.  You have redness, swelling, pus, a bad smell, soreness (inflammation), or red streaks coming away from the stitches. These are signs of infection. MAKE SURE YOU:   Understand these instructions.  Will watch your condition.  Will get help right away if you are not doing well or get worse. Document Released: 01/28/2000 Document Revised: 04/24/2011 Document Reviewed: 05/23/2007 [facility] Patient Information 2015 [place], [place]. This information is not intended to replace advice given to you by your health care provider. Make sure you discuss any questions you have with your health care provider.

## 2013-09-18 NOTE — Op Note (Deleted)
NAMELEVETTE, PAULICK NO.:  [id number]  MEDICAL RECORD NO.:  [id number]  LOCATION:                               FACILITY:  [facility]  PHYSICIAN:  Melrose Nakayama, MD  DATE OF BIRTH:  [birth year]  DATE OF PROCEDURE: DATE OF DISCHARGE:  09/18/2013                              OPERATIVE REPORT   PREOPERATIVE DIAGNOSIS:  Left hand volar mass.  POSTOPERATIVE DIAGNOSIS:  Left hand volar mass.  ATTENDING PHYSICIAN:  Melrose Nakayama, MD, who scrubbed and present for the entire procedure.  ASSISTANT SURGEON:  None.  ANESTHESIA:  Xylocaine with 0.25% Marcaine, local block.  SURGICAL PROCEDURE:  Left hand partial palmar fasciectomy.  SPECIMEN:  Surgical volar mass to pathology.  SURGICAL INDICATIONS:  Ms. Lampkins is a right-hand-dominant female with enlarging mass over the volar aspect of the left hand that was painful, likely to undergo the above procedure.  Risks, benefits, and alternatives were discussed in detail with the patient and signed informed consent was obtained.  Risks include, but not limited to bleeding, infection, damage to nearby nerves, arteries, or tendons, loss of motion of wrist and digits, incomplete relief of symptoms, and need for further surgical intervention.  DESCRIPTION OF PROCEDURE:  The patient was properly identified in the preop holding area and marked with a permanent marker made on left hand to indicate the correct operative site.  The patient was then brought back to the operating room, placed supine on anesthesia table, and IV sedation was administered.  The patient tolerated this well.  A well- padded tourniquet was placed on the left forearm and sealed with 1000- drape.  Local anesthetic was administered.  The patient tolerated this well.  The patient's upper extremity was then prepped and draped in normal sterile fashion.  Time-out was called.  The correct site was identified, and procedure then begun.  Attention then turned  to the left hand where a small Bruner incision made directly over the mid palm region.  Dissection was carried down through the skin and subcutaneous tissue.  The tourniquet insufflated.  Deep dissection carried down to the palmar fascia where the circumferential dissection was carried around the palmar fascia and partial palmar fasciectomy was then carried out.  Careful protection of the neurovascular bundles to the ring and long finger were then carefully protected and the mass was then excised. After partial palmar fasciectomy, the wound was then thoroughly irrigated.  Tourniquet deflated.  Hemostasis was obtained.  The skin was then closed using simple Prolene sutures.  Adaptic dressing, sterile compressive bandage applied.  The patient tolerated the procedure well, returned to recovery room in good condition.  POSTPROCEDURE PLAN:  The patient was discharged home, seen back in the office in approximately 10 days for wound check, suture removal.  Go over Pathology.  Gradual use and activity.  PATHOLOGY:  Volar mass to pathology.     Melrose Nakayama, MD     FWO/MEDQ  D:  09/18/2013  T:  09/18/2013  Job:  [id number]

## 2013-09-18 NOTE — Transfer of Care (Signed)
Immediate Anesthesia Transfer of Care Note  Patient: [name]  Procedure(s) Performed: Procedure(s): LEFT HAND VOLAR MASS EXCISION (Left)  Patient Location: PACU  Anesthesia Type:MAC  Level of Consciousness: awake and alert   Airway & Oxygen Therapy: Patient Spontanous Breathing  Post-op Assessment: Report given to PACU RN  Post vital signs: Reviewed  Complications: No apparent anesthesia complications

## 2013-09-18 NOTE — Anesthesia Postprocedure Evaluation (Signed)
  Anesthesia Post-op Note  Patient: [name]  Procedure(s) Performed: Procedure(s): LEFT HAND VOLAR MASS EXCISION (Left)  Patient Location: PACU  Anesthesia Type:MAC  Level of Consciousness: awake, alert  and oriented  Airway and Oxygen Therapy: Patient Spontanous Breathing  Post-op Pain: none  Post-op Assessment: Post-op Vital signs reviewed  Post-op Vital Signs: Reviewed  Last Vitals:  Filed Vitals:   09/18/13 1600  BP: 132/74  Pulse: 73  Temp:   Resp: 21    Complications: No apparent anesthesia complications

## 2013-09-18 NOTE — Brief Op Note (Signed)
09/18/2013  1:37 PM  PATIENT:  [name]  58 y.o. female  PRE-OPERATIVE DIAGNOSIS:  LEFT HAND VOLAR MASS  POST-OPERATIVE DIAGNOSIS:  * No post-op diagnosis entered *  PROCEDURE:  Procedure(s): LEFT HAND VOLAR MASS EXCISION (Left)  SURGEON:  Surgeon(s) and Role:    * Linna Hoff, MD - Primary  PHYSICIAN ASSISTANT:   ASSISTANTS: none   ANESTHESIA:   local  EBL:     BLOOD ADMINISTERED:none  DRAINS: none   LOCAL MEDICATIONS USED:  MARCAINE     SPECIMEN:  No Specimen  DISPOSITION OF SPECIMEN:  PATHOLOGY  COUNTS:  YES  TOURNIQUET:    DICTATION: .[id number]  PLAN OF CARE: Discharge to home after PACU  PATIENT DISPOSITION:  PACU - hemodynamically stable.   Delay start of Pharmacological VTE agent (>24hrs) due to surgical blood loss or risk of bleeding: not applicable

## 2013-09-18 NOTE — Op Note (Signed)
NAMEMARENDA, ACCARDI NO.:  [id number]  MEDICAL RECORD NO.:  [id number]  LOCATION:                               FACILITY:  [facility]  PHYSICIAN:  Melrose Nakayama, MD  DATE OF BIRTH:  1955/11/27  DATE OF PROCEDURE: DATE OF DISCHARGE:  09/18/2013                              OPERATIVE REPORT   PREOPERATIVE DIAGNOSIS:  Left hand volar mass.  POSTOPERATIVE DIAGNOSIS:  Left hand volar mass.  ATTENDING PHYSICIAN:  Melrose Nakayama, MD, who scrubbed and present for the entire procedure.  ASSISTANT SURGEON:  None.  ANESTHESIA:  Xylocaine with 0.25% Marcaine, local block.  SURGICAL PROCEDURE:  Left hand partial palmar fasciectomy.  SPECIMEN:  Surgical volar mass to pathology.  SURGICAL INDICATIONS:  Ms. Kachmar is a right-hand-dominant female with enlarging mass over the volar aspect of the left hand that was painful, likely to undergo the above procedure.  Risks, benefits, and alternatives were discussed in detail with the patient and signed informed consent was obtained.  Risks include, but not limited to bleeding, infection, damage to nearby nerves, arteries, or tendons, loss of motion of wrist and digits, incomplete relief of symptoms, and need for further surgical intervention.  DESCRIPTION OF PROCEDURE:  The patient was properly identified in the preop holding area and marked with a permanent marker made on left hand to indicate the correct operative site.  The patient was then brought back to the operating room, placed supine on anesthesia table, and IV sedation was administered.  The patient tolerated this well.  A well- padded tourniquet was placed on the left forearm and sealed with 1000- drape.  Local anesthetic was administered.  The patient tolerated this well.  The patient's upper extremity was then prepped and draped in normal sterile fashion.  Time-out was called.  The correct site was identified, and procedure then begun.  Attention then turned  to the left hand where a small Bruner incision made directly over the mid palm region.  Dissection was carried down through the skin and subcutaneous tissue.  The tourniquet insufflated.  Deep dissection carried down to the palmar fascia where the circumferential dissection was carried around the palmar fascia and partial palmar fasciectomy was then carried out.  Careful protection of the neurovascular bundles to the ring and long finger were then carefully protected and the mass was then excised. After partial palmar fasciectomy, the wound was then thoroughly irrigated.  Tourniquet deflated.  Hemostasis was obtained.  The skin was then closed using simple Prolene sutures.  Adaptic dressing, sterile compressive bandage applied.  The patient tolerated the procedure well, returned to recovery room in good condition.  POSTPROCEDURE PLAN:  The patient was discharged home, seen back in the office in approximately 10 days for wound check, suture removal.  Go over Pathology.  Gradual use and activity.  PATHOLOGY:  Volar mass to pathology.     Melrose Nakayama, MD     FWO/MEDQ  D:  09/18/2013  T:  09/18/2013  Job:  [id number]

## 2013-09-19 ENCOUNTER — Encounter (HOSPITAL_BASED_OUTPATIENT_CLINIC_OR_DEPARTMENT_OTHER): Payer: Self-pay | Admitting: Orthopedic Surgery

## 2013-11-28 ENCOUNTER — Other Ambulatory Visit: Payer: Self-pay

## 2015-01-04 ENCOUNTER — Other Ambulatory Visit: Payer: Self-pay

## 2015-01-04 ENCOUNTER — Other Ambulatory Visit: Payer: Self-pay | Admitting: Family Medicine

## 2015-01-04 DIAGNOSIS — R921 Mammographic calcification found on diagnostic imaging of breast: Secondary | ICD-10-CM

## 2015-01-13 ENCOUNTER — Ambulatory Visit
Admission: RE | Admit: 2015-01-13 | Discharge: 2015-01-13 | Disposition: A | Discharge status: Home / Self Care | Payer: Commercial Managed Care - PPO | Source: Ambulatory Visit | Attending: Family Medicine | Admitting: Family Medicine

## 2015-01-13 DIAGNOSIS — R921 Mammographic calcification found on diagnostic imaging of breast: Secondary | ICD-10-CM

## 2015-06-27 ENCOUNTER — Emergency Department (HOSPITAL_COMMUNITY)
Admission: EM | Admit: 2015-06-27 | Discharge: 2015-06-27 | Disposition: A | Discharge status: Home / Self Care | Payer: Commercial Managed Care - PPO | Attending: Emergency Medicine | Admitting: Emergency Medicine

## 2015-06-27 ENCOUNTER — Encounter (HOSPITAL_COMMUNITY): Payer: Self-pay | Admitting: *Deleted

## 2015-06-27 DIAGNOSIS — Z91040 Latex allergy status: Secondary | ICD-10-CM | POA: Insufficient documentation

## 2015-06-27 DIAGNOSIS — H9201 Otalgia, right ear: Secondary | ICD-10-CM | POA: Diagnosis present

## 2015-06-27 DIAGNOSIS — I10 Essential (primary) hypertension: Secondary | ICD-10-CM | POA: Diagnosis not present

## 2015-06-27 DIAGNOSIS — Z79899 Other long term (current) drug therapy: Secondary | ICD-10-CM | POA: Diagnosis not present

## 2015-06-27 DIAGNOSIS — H6691 Otitis media, unspecified, right ear: Secondary | ICD-10-CM | POA: Insufficient documentation

## 2015-06-27 DIAGNOSIS — Z880 Allergy status to penicillin: Secondary | ICD-10-CM | POA: Diagnosis not present

## 2015-06-27 DIAGNOSIS — H7291 Unspecified perforation of tympanic membrane, right ear: Secondary | ICD-10-CM | POA: Insufficient documentation

## 2015-06-27 DIAGNOSIS — M109 Gout, unspecified: Secondary | ICD-10-CM | POA: Insufficient documentation

## 2015-06-27 DIAGNOSIS — Z85528 Personal history of other malignant neoplasm of kidney: Secondary | ICD-10-CM | POA: Diagnosis not present

## 2015-06-27 DIAGNOSIS — F329 Major depressive disorder, single episode, unspecified: Secondary | ICD-10-CM | POA: Insufficient documentation

## 2015-06-27 DIAGNOSIS — Z862 Personal history of diseases of the blood and blood-forming organs and certain disorders involving the immune mechanism: Secondary | ICD-10-CM | POA: Insufficient documentation

## 2015-06-27 DIAGNOSIS — Z85020 Personal history of malignant carcinoid tumor of stomach: Secondary | ICD-10-CM | POA: Diagnosis not present

## 2015-06-27 DIAGNOSIS — Z8781 Personal history of (healed) traumatic fracture: Secondary | ICD-10-CM | POA: Insufficient documentation

## 2015-06-27 DIAGNOSIS — K219 Gastro-esophageal reflux disease without esophagitis: Secondary | ICD-10-CM | POA: Insufficient documentation

## 2015-06-27 DIAGNOSIS — Z86010 Personal history of colonic polyps: Secondary | ICD-10-CM | POA: Diagnosis not present

## 2015-06-27 DIAGNOSIS — H6091 Unspecified otitis externa, right ear: Secondary | ICD-10-CM | POA: Insufficient documentation

## 2015-06-27 MED ORDER — CIPROFLOXACIN-DEXAMETHASONE 0.3-0.1 % OT SUSP: 4.0000 [drp] | Freq: Two times a day (BID) | OTIC | Status: DC

## 2015-06-27 MED ORDER — FLUCONAZOLE 150 MG PO TABS: 150.0000 mg | ORAL_TABLET | Freq: Once | ORAL | Status: AC | PRN

## 2015-06-27 MED ORDER — AMOXICILLIN 500 MG PO CAPS: 1000.0000 mg | ORAL_CAPSULE | Freq: Two times a day (BID) | ORAL | Status: DC

## 2015-06-27 NOTE — Discharge Instructions (Signed)
Eardrum Perforation An eardrum perforation is a puncture or tear in the eardrum. This is also called a ruptured eardrum. The eardrum is a thin, round tissue inside of your ear that separates your ear canal from your middle ear. This is the tissue that detects sound and enables you to hear. An eardrum perforation can cause discomfort and hearing loss. In most cases, the eardrum will heal without treatment and with little or no permanent hearing loss. CAUSES An eardrum perforation can result from different causes, including:  Sudden pressure changes that happen in situations such as scuba diving or flying in an airplane.  Foreign objects in the ear.  Inserting a cotton-tipped swab or any blunt object into the ear.  Loud noise.  Trauma to the ear.  Attempting to remove an object from the ear. SIGNS AND SYMPTOMS  Hearing loss.  Ear pain.  Ringing in the ear.  Discharge or bleeding from the ear.  Dizziness.  Vomiting.  Facial paralysis. DIAGNOSIS  Your health care provider will examine your ear using a tool called an otoscope. This tool allows the health care provider to see into your ear to examine your eardrum. Various types of hearing tests may also be done. TREATMENT  Typically, the eardrum will heal on its own within a few weeks. If your eardrum does not heal, your health care provider may recommend one of the following treatments:  A procedure to place a patch over your eardrum.  Surgery to repair your eardrum. HOME CARE INSTRUCTIONS   Keep your ear dry. This will improve healing. Do not submerge your head under water until healing is complete. Do not swim or dive until your health care provider approves. While bathing or showering, protect your ear using one of these methods:  Using a waterproof earplug.  Covering a piece of cotton with petroleum jelly and placing it in your outer ear canal.  Take medicines only as directed by your health care provider.  Avoid  blowing your nose if possible. If you blow your nose, do it gently. Forceful blowing increases the pressure in your middle ear. This may cause further injury or may delay your healing.  Resume your normal activities after the perforation has healed. Your health care provider can tell you when this has occurred.  Talk to your health care provider before you fly on an airplane. Air travel is generally allowed with a perforated eardrum.  Keep all follow-up visits as directed by your health care provider. This is important. SEEK MEDICAL CARE IF:  You have a fever. SEEK IMMEDIATE MEDICAL CARE IF:  You have blood or pus coming from your ear.  You have dizziness or problems with balance.  You have nausea or vomiting.  You have increased pain.   This information is not intended to replace advice given to you by your health care provider. Make sure you discuss any questions you have with your health care provider.   Document Released: 01/28/2000 Document Revised: 02/20/2014 Document Reviewed: 09/08/2013 Elsevier Interactive Patient Education 2016 Else[employer]  Otitis Externa Otitis externa is a bacterial or fungal infection of the outer ear canal. This is the area from the eardrum to the outside of the ear. Otitis externa is sometimes called "swimmer's ear." CAUSES  Possible causes of infection include:  Swimming in dirty water.  Moisture remaining in the ear after swimming or bathing.  Mild injury (trauma) to the ear.  Objects stuck in the ear (foreign body).  Cuts or scrapes (abrasions) on  the outside of the ear. SIGNS AND SYMPTOMS  The first symptom of infection is often itching in the ear canal. Later signs and symptoms may include swelling and redness of the ear canal, ear pain, and yellowish-white fluid (pus) coming from the ear. The ear pain may be worse when pulling on the earlobe. DIAGNOSIS  Your health care provider will perform a physical exam. A sample of fluid may be  taken from the ear and examined for bacteria or fungi. TREATMENT  Antibiotic ear drops are often given for 10 to 14 days. Treatment may also include pain medicine or corticosteroids to reduce itching and swelling. HOME CARE INSTRUCTIONS   Apply antibiotic ear drops to the ear canal as prescribed by your health care provider.  Take medicines only as directed by your health care provider.  If you have diabetes, follow any additional treatment instructions from your health care provider.  Keep all follow-up visits as directed by your health care provider. PREVENTION   Keep your ear dry. Use the corner of a towel to absorb water out of the ear canal after swimming or bathing.  Avoid scratching or putting objects inside your ear. This can damage the ear canal or remove the protective wax that lines the canal. This makes it easier for bacteria and fungi to grow.  Avoid swimming in lakes, polluted water, or poorly chlorinated pools.  You may use ear drops made of rubbing alcohol and vinegar after swimming. Combine equal parts of white vinegar and alcohol in a bottle. Put 3 or 4 drops into each ear after swimming. SEEK MEDICAL CARE IF:   You have a fever.  Your ear is still red, swollen, painful, or draining pus after 3 days.  Your redness, swelling, or pain gets worse.  You have a severe headache.  You have redness, swelling, pain, or tenderness in the area behind your ear. MAKE SURE YOU:   Understand these instructions.  Will watch your condition.  Will get help right away if you are not doing well or get worse.   This information is not intended to replace advice given to you by your health care provider. Make sure you discuss any questions you have with your health care provider.   Document Released: 01/30/2005 Document Revised: 02/20/2014 Document Reviewed: 02/16/2011 Elsevier Interactive Patient Education [employer].

## 2015-06-27 NOTE — ED Notes (Signed)
Rt ear pain for 2  Weeks  She saw a doctor earlier today no better..  She is hearing impaired

## 2015-06-27 NOTE — ED Notes (Cosign Needed)
Pt have not been brought back to the room.  Will defer to next provider.  Domenic Moras, PA-C 06/27/15 [phone]
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
## 2015-06-27 NOTE — ED Notes (Signed)
Patient able to ambulate independently  

## 2015-08-11 IMAGING — CT CT ABD-PELV W/ CM
2 of 5 series · 17 of 46 positions shown, 19 images · IV contrast (READICAT/WATER & [ID] OMNI 300)
Comparison: 03/24/2011

CLINICAL DATA: Possible hernia, pain in surgery site, gastric mass
removal April 2012, site marked with vitamin e capsule

CT ABDOMEN AND PELVIS WITH CONTRAST
TECHNIQUE: Multidetector CT imaging of the abdomen and pelvis was
performed following the standard protocol during bolus
administration of intravenous contrast.
Contrast: 125mL OMNIPAQUE IOHEXOL 300 MG/ML  SOLN

[Series 2: abd/pelvis with · axial · 0.90mm/px · z∈[-394,+31]mm · 14 of 95 slices shown, 16 images]
[im 5/95  soft-tissue]
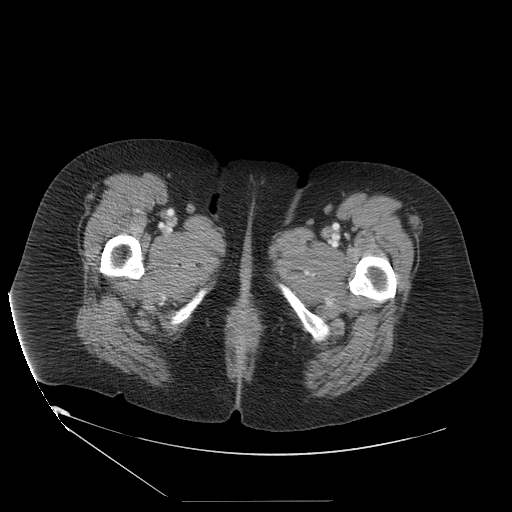
[im 5/95  bone]
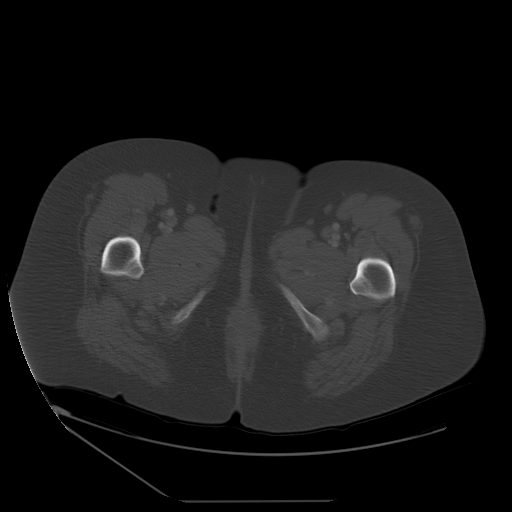
[im 15/95  soft-tissue]
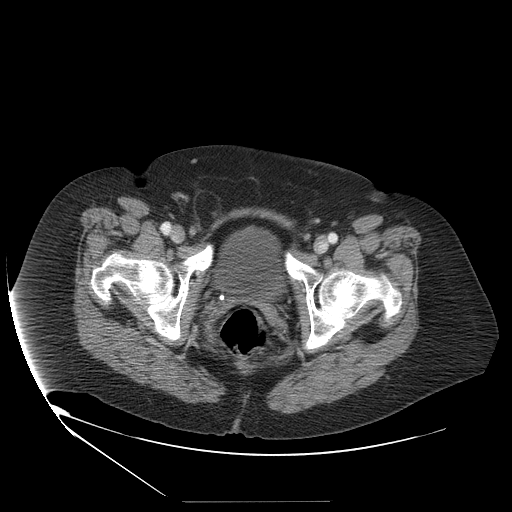
[im 19/95  soft-tissue]
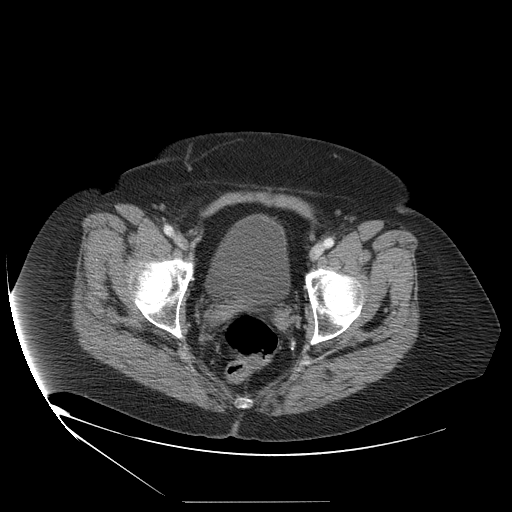
[im 24/95  soft-tissue]
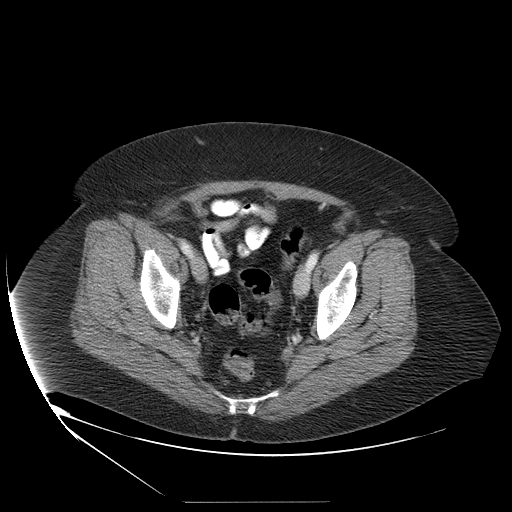
[im 33/95  soft-tissue]
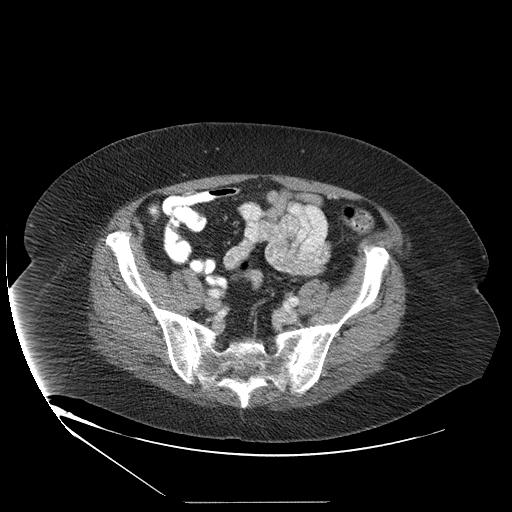
[im 38/95  soft-tissue]
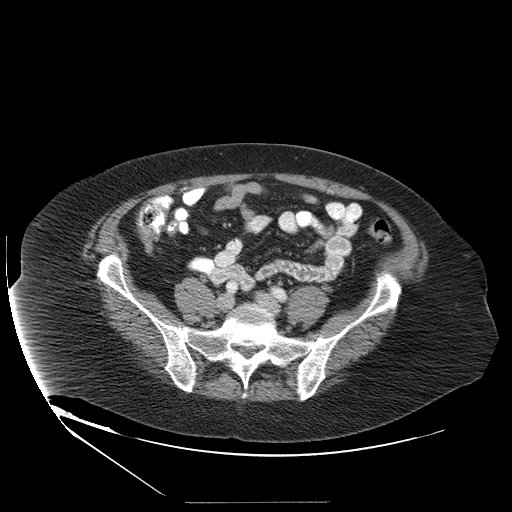
[im 43/95  soft-tissue]
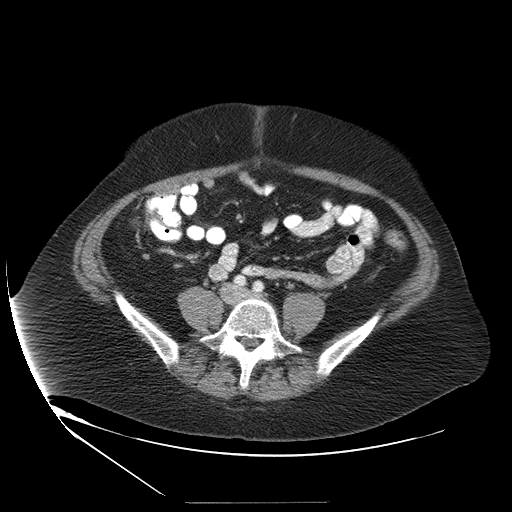
[im 52/95  soft-tissue]
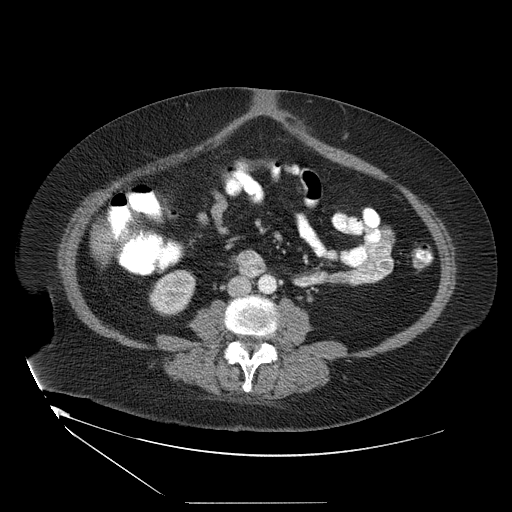
[im 57/95  soft-tissue]
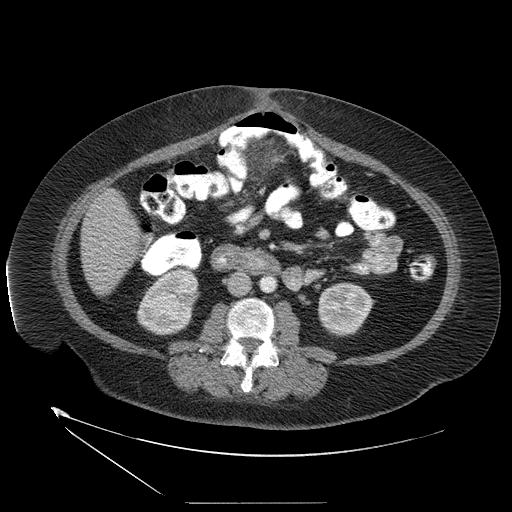
[im 57/95  bone]
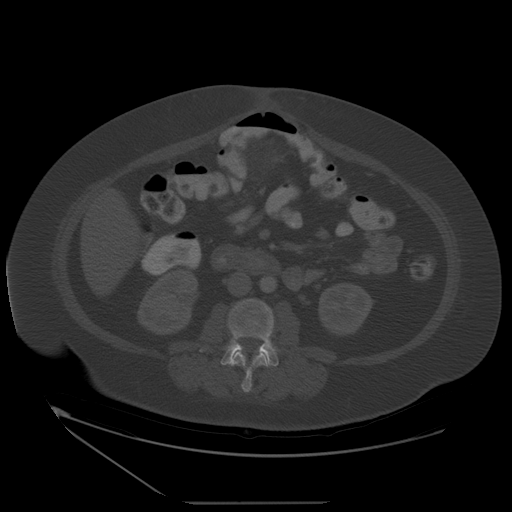
[im 62/95  soft-tissue]
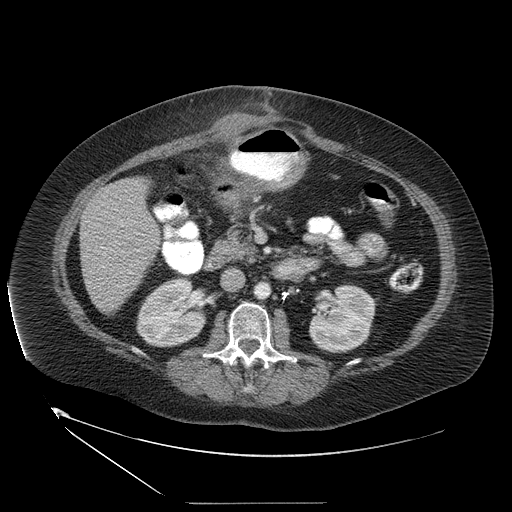
[im 71/95  soft-tissue]
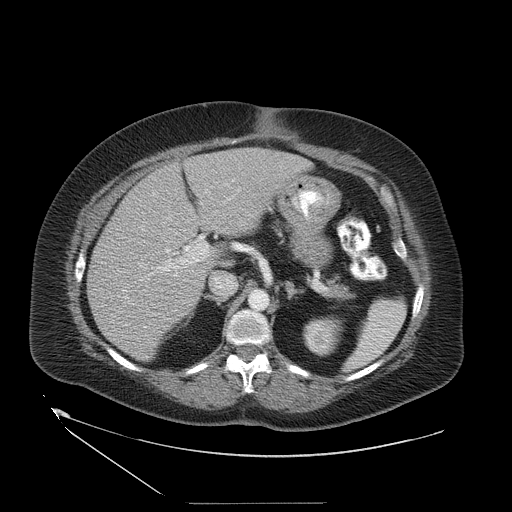
[im 76/95  soft-tissue]
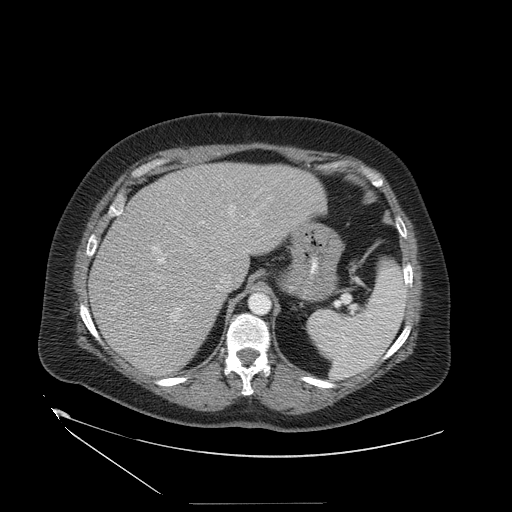
[im 80/95  soft-tissue]
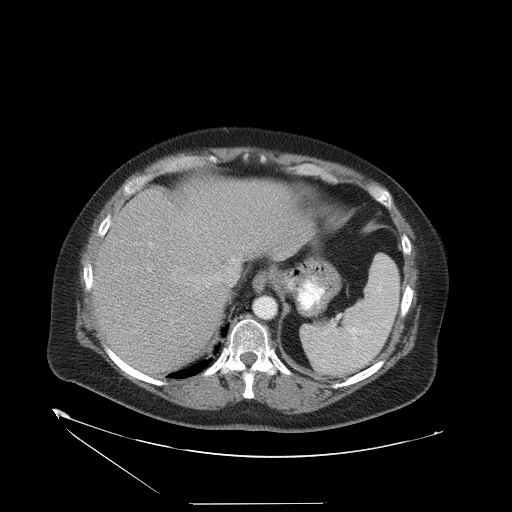
[im 90/95  soft-tissue]
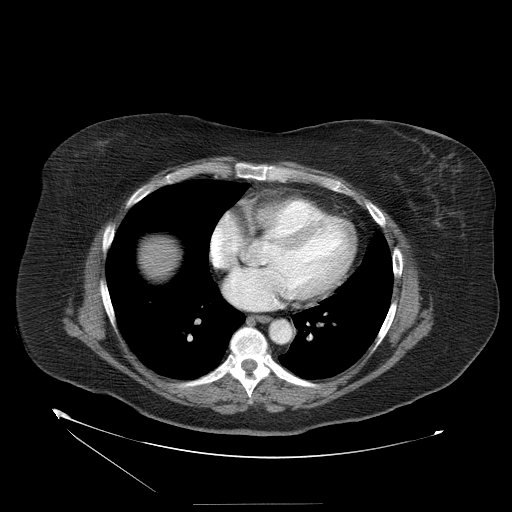

[Series 401: coronal · coronal · 0.94mm/px · 3 of 130 slices shown]
[im 44/130  soft-tissue]
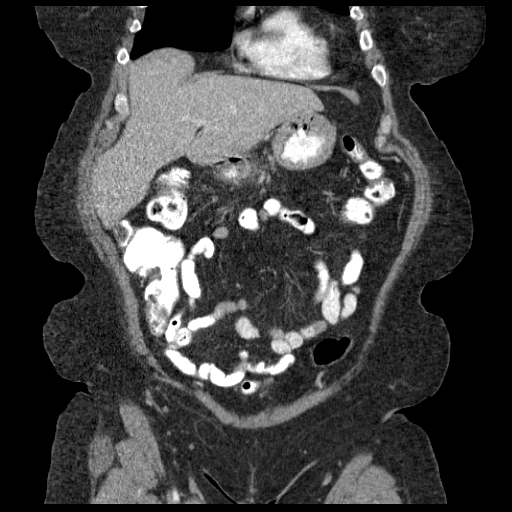
[im 58/130  soft-tissue]
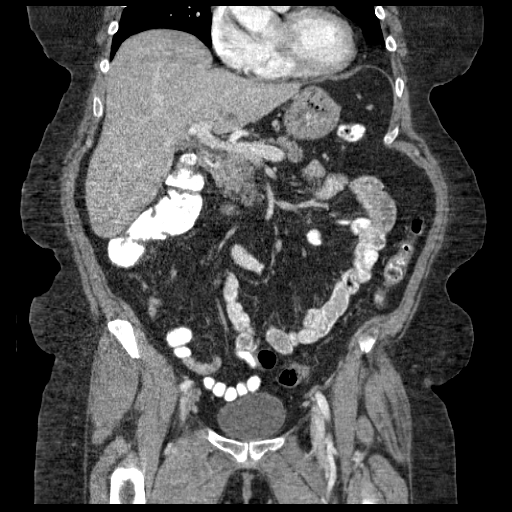
[im 72/130  soft-tissue]
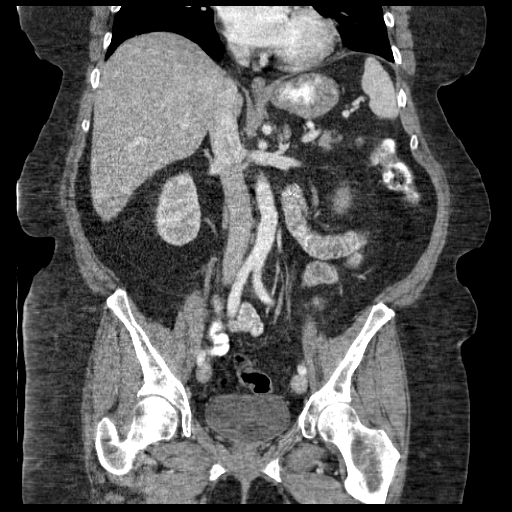

[17 of 46 positions shown; findings below may reference images not displayed]

FINDINGS: Visualized lung bases clear.  No acute osseous
abnormalities.

Liver is normal.  Gallbladder is absent.  Spleen, pancreas, adrenal
glands, and kidneys normal.  Nonobstructive bowel gas pattern.
Bladder normal.  No free fluid or adenopathy in the abdomen or
pelvis.

Vitamin E marker capsule indicates the area of pain.  To the right
of midline at this level, just cranial to the mellitus, there is a
rim enhancing fluid collection measuring 31 x 58 x 35 mm.  A few
bubbles of air are seen within the abnormality.  There is mild
focal surrounding subcutaneous soft tissue increased attenuation.
The gastric antum is noted to lie immediately deep to the anterior
abdominal wall at this level, with no findings to definitively
indicate or suggest fistula.  Further inferiorly, there is anterior
bulging of the anterior abdominal wall with a focal small hernia to
the left of midline and, measuring about 2 cm and containing only
mesenteric fat.
IMPRESSION: The palpable abnormality marked with a marker capsule appears to be
related to a subcutaneous abscess associated with mild cellulitis.

## 2015-10-29 ENCOUNTER — Other Ambulatory Visit: Payer: Self-pay | Admitting: Family Medicine

## 2015-10-29 DIAGNOSIS — Z1231 Encounter for screening mammogram for malignant neoplasm of breast: Secondary | ICD-10-CM

## 2015-11-12 DIAGNOSIS — R040 Epistaxis: Secondary | ICD-10-CM | POA: Insufficient documentation

## 2015-12-14 ENCOUNTER — Telehealth: Payer: Self-pay | Admitting: Rheumatology

## 2015-12-14 DIAGNOSIS — E2839 Other primary ovarian failure: Secondary | ICD-10-CM

## 2015-12-14 NOTE — Telephone Encounter (Signed)
Order was faxed to them in Sept. I spoke to her husband, told him I can resend. He states she would prefer to go somewhere else. I have put in the order for the breast center they will call her to make appt

## 2015-12-14 NOTE — Telephone Encounter (Signed)
Patient's husband called about a bone density scan for his wife. He states that [facility] has a new machine since the last scan the patient had done. He would like to know if she should go back there to have a scan done or go somewhere else? If she needs to go back there, he states the orders need to be faxed to them because they do not have them.

## 2015-12-23 ENCOUNTER — Other Ambulatory Visit (HOSPITAL_COMMUNITY): Payer: Self-pay | Admitting: Gastroenterology

## 2015-12-23 DIAGNOSIS — R1084 Generalized abdominal pain: Secondary | ICD-10-CM

## 2015-12-27 ENCOUNTER — Ambulatory Visit (HOSPITAL_COMMUNITY): Payer: Commercial Managed Care - PPO

## 2015-12-30 ENCOUNTER — Ambulatory Visit (HOSPITAL_COMMUNITY)
Admission: RE | Admit: 2015-12-30 | Discharge: 2015-12-30 | Disposition: A | Discharge status: Home / Self Care | Payer: Commercial Managed Care - PPO | Source: Ambulatory Visit | Attending: Gastroenterology | Admitting: Gastroenterology

## 2015-12-30 DIAGNOSIS — R1084 Generalized abdominal pain: Secondary | ICD-10-CM | POA: Diagnosis present

## 2015-12-30 DIAGNOSIS — I700 Atherosclerosis of aorta: Secondary | ICD-10-CM | POA: Insufficient documentation

## 2015-12-30 MED ORDER — IOPAMIDOL (ISOVUE-300) INJECTION 61%
100.0000 mL | Freq: Once | INTRAVENOUS | Status: AC | PRN
  Administered 2015-12-30: 100 mL via INTRAVENOUS

## 2015-12-30 MED ORDER — SODIUM CHLORIDE 0.9 % IJ SOLN
INTRAMUSCULAR | Status: AC
  Filled 2015-12-30: qty 50

## 2015-12-30 MED ORDER — IOPAMIDOL (ISOVUE-300) INJECTION 61%
INTRAVENOUS | Status: AC
  Filled 2015-12-30: qty 100

## 2016-01-17 ENCOUNTER — Ambulatory Visit
Admission: RE | Admit: 2016-01-17 | Discharge: 2016-01-17 | Disposition: A | Discharge status: Home / Self Care | Payer: Commercial Managed Care - PPO | Source: Ambulatory Visit | Attending: Family Medicine | Admitting: Family Medicine

## 2016-01-17 DIAGNOSIS — Z1231 Encounter for screening mammogram for malignant neoplasm of breast: Secondary | ICD-10-CM

## 2016-01-19 ENCOUNTER — Other Ambulatory Visit: Payer: Self-pay | Admitting: Family Medicine

## 2016-01-19 DIAGNOSIS — R928 Other abnormal and inconclusive findings on diagnostic imaging of breast: Secondary | ICD-10-CM

## 2016-01-21 ENCOUNTER — Ambulatory Visit
Admission: RE | Admit: 2016-01-21 | Discharge: 2016-01-21 | Disposition: A | Discharge status: Home / Self Care | Payer: Commercial Managed Care - PPO | Source: Ambulatory Visit | Attending: Family Medicine | Admitting: Family Medicine

## 2016-01-21 ENCOUNTER — Other Ambulatory Visit: Payer: Self-pay | Admitting: Family Medicine

## 2016-01-21 DIAGNOSIS — N6489 Other specified disorders of breast: Secondary | ICD-10-CM

## 2016-01-21 DIAGNOSIS — R928 Other abnormal and inconclusive findings on diagnostic imaging of breast: Secondary | ICD-10-CM

## 2016-01-25 ENCOUNTER — Ambulatory Visit
Admission: RE | Admit: 2016-01-25 | Discharge: 2016-01-25 | Disposition: A | Discharge status: Home / Self Care | Payer: Commercial Managed Care - PPO | Source: Ambulatory Visit | Attending: Family Medicine | Admitting: Family Medicine

## 2016-01-25 ENCOUNTER — Other Ambulatory Visit: Payer: Self-pay | Admitting: Family Medicine

## 2016-01-25 DIAGNOSIS — N6489 Other specified disorders of breast: Secondary | ICD-10-CM

## 2016-01-26 ENCOUNTER — Other Ambulatory Visit: Payer: Self-pay

## 2016-01-27 ENCOUNTER — Encounter: Payer: Self-pay | Admitting: Rheumatology

## 2016-02-21 ENCOUNTER — Other Ambulatory Visit: Payer: Self-pay | Admitting: Rheumatology

## 2016-02-21 NOTE — Telephone Encounter (Signed)
Last Visit: 10/07/15  Next Visit due February 2018. Message sent to the front to schedule patient.  Labs: 10/01/15 WNL  Okay to refill Allopurinol?

## 2016-03-08 ENCOUNTER — Telehealth: Payer: Self-pay | Admitting: *Deleted

## 2016-03-08 NOTE — Telephone Encounter (Signed)
Spoke with patient's husband Christia Reading who is on HIPPA forms. Advised him that Mrs. [name]'s bone density scan results show that she has osteopenia and she should take calcium, vitamin D and do resistive exercises. Patient's husband will make aure patient is aware of results.

## 2016-03-16 ENCOUNTER — Ambulatory Visit (INDEPENDENT_AMBULATORY_CARE_PROVIDER_SITE_OTHER): Payer: Commercial Managed Care - PPO | Admitting: Rheumatology

## 2016-03-16 ENCOUNTER — Ambulatory Visit (INDEPENDENT_AMBULATORY_CARE_PROVIDER_SITE_OTHER): Payer: Commercial Managed Care - PPO

## 2016-03-16 ENCOUNTER — Encounter: Payer: Self-pay | Admitting: Rheumatology

## 2016-03-16 VITALS — BP 134/80 | HR 80 | Resp 16 | Ht 63.0 in | Wt 262.0 lb

## 2016-03-16 DIAGNOSIS — M25562 Pain in left knee: Secondary | ICD-10-CM

## 2016-03-16 DIAGNOSIS — M19079 Primary osteoarthritis, unspecified ankle and foot: Secondary | ICD-10-CM | POA: Diagnosis not present

## 2016-03-16 DIAGNOSIS — M25561 Pain in right knee: Secondary | ICD-10-CM

## 2016-03-16 DIAGNOSIS — G8929 Other chronic pain: Secondary | ICD-10-CM

## 2016-03-16 DIAGNOSIS — M19041 Primary osteoarthritis, right hand: Secondary | ICD-10-CM

## 2016-03-16 DIAGNOSIS — M1A9XX0 Chronic gout, unspecified, without tophus (tophi): Secondary | ICD-10-CM

## 2016-03-16 DIAGNOSIS — M19042 Primary osteoarthritis, left hand: Secondary | ICD-10-CM

## 2016-03-16 MED ORDER — COLCHICINE 0.6 MG PO CAPS: 0.6000 mg | ORAL_CAPSULE | Freq: Every day | ORAL | 0 refills | Status: DC | PRN

## 2016-03-16 NOTE — Progress Notes (Signed)
Medication Samples have been provided to the patient.  Drug name: Mitigare       Strength: 0.6 mg        Qty: 15  LOT: [id number]  Exp.Date: 08/2016  Dosing instructions: Take 1 capsule by mouth daily as needed for flare.    The patient has been instructed regarding the correct time, dose, and frequency of taking this medication, including desired effects and most common side effects.   Cyndia Skeeters 3:47 PM 03/16/2016
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
## 2016-03-17 ENCOUNTER — Telehealth: Payer: Self-pay | Admitting: *Deleted

## 2016-03-17 LAB — URIC ACID: URIC ACID, SERUM: 4.8 mg/dL (ref 2.5–7.0)

## 2016-03-17 NOTE — Telephone Encounter (Signed)
-----   Message from [place], [place] sent at 03/16/2016  9:16 PM EST ----- Regarding: Tell patient: Moderate severe osteoarthritis of bilateral knees Tell patient  Moderate severe osteoarthritis of bilateral knees.  If patient starts having pain and stiffness in the knees, she can call for an urgent work in appointment for right knee joint injection and if we have a spot available I'll be happy to help her. However we will be able to discuss anything else and we can only give her knee injection

## 2016-03-17 NOTE — Telephone Encounter (Signed)
-----   Message from [place], [place] sent at 03/16/2016  9:16 PM EST ----- Regarding: Tell patient: Moderate severe osteoarthritis of bilateral knees Tell patient  Moderate severe osteoarthritis of bilateral knees.  If patient starts having pain and stiffness in the knees, she can call for an urgent work in appointment for right knee joint injection and if we have a spot available I'll be happy to help her. However we will be able to discuss anything else and we can only give her knee injection

## 2016-03-17 NOTE — Telephone Encounter (Signed)
Advised patient's spouse who is on [employer] of results and he is scheduling an appointment for patient to be seen for an injection.

## 2016-04-10 DIAGNOSIS — M25561 Pain in right knee: Secondary | ICD-10-CM

## 2016-04-10 DIAGNOSIS — M19041 Primary osteoarthritis, right hand: Secondary | ICD-10-CM | POA: Insufficient documentation

## 2016-04-10 DIAGNOSIS — M1A00X0 Idiopathic chronic gout, unspecified site, without tophus (tophi): Secondary | ICD-10-CM | POA: Insufficient documentation

## 2016-04-10 DIAGNOSIS — M25562 Pain in left knee: Secondary | ICD-10-CM

## 2016-04-10 DIAGNOSIS — M19079 Primary osteoarthritis, unspecified ankle and foot: Secondary | ICD-10-CM | POA: Insufficient documentation

## 2016-04-10 DIAGNOSIS — M19042 Primary osteoarthritis, left hand: Secondary | ICD-10-CM

## 2016-04-10 DIAGNOSIS — G8929 Other chronic pain: Secondary | ICD-10-CM | POA: Insufficient documentation
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
## 2016-04-13 ENCOUNTER — Encounter: Payer: Self-pay | Admitting: Rheumatology

## 2016-04-13 ENCOUNTER — Ambulatory Visit (INDEPENDENT_AMBULATORY_CARE_PROVIDER_SITE_OTHER): Payer: Commercial Managed Care - PPO | Admitting: Rheumatology

## 2016-04-13 VITALS — BP 142/91 | HR 76 | Resp 13 | Ht 63.0 in | Wt 261.0 lb

## 2016-04-13 DIAGNOSIS — M19041 Primary osteoarthritis, right hand: Secondary | ICD-10-CM

## 2016-04-13 DIAGNOSIS — M19042 Primary osteoarthritis, left hand: Secondary | ICD-10-CM

## 2016-04-13 DIAGNOSIS — M19079 Primary osteoarthritis, unspecified ankle and foot: Secondary | ICD-10-CM | POA: Diagnosis not present

## 2016-04-13 DIAGNOSIS — G8929 Other chronic pain: Secondary | ICD-10-CM

## 2016-04-13 DIAGNOSIS — M25561 Pain in right knee: Secondary | ICD-10-CM | POA: Diagnosis not present

## 2016-04-13 DIAGNOSIS — M25562 Pain in left knee: Secondary | ICD-10-CM

## 2016-04-13 DIAGNOSIS — M1A00X0 Idiopathic chronic gout, unspecified site, without tophus (tophi): Secondary | ICD-10-CM

## 2016-04-13 MED ORDER — TRIAMCINOLONE ACETONIDE 40 MG/ML IJ SUSP
40.0000 mg | INTRAMUSCULAR | Status: AC | PRN
  Administered 2016-04-13: 40 mg via INTRA_ARTICULAR

## 2016-04-13 MED ORDER — LIDOCAINE HCL 1 % IJ SOLN
1.5000 mL | INTRAMUSCULAR | Status: AC | PRN
  Administered 2016-04-13: 1.5 mL

## 2016-06-08 ENCOUNTER — Telehealth (INDEPENDENT_AMBULATORY_CARE_PROVIDER_SITE_OTHER): Payer: Self-pay | Admitting: Rheumatology

## 2016-06-08 NOTE — Telephone Encounter (Signed)
Patient asking if she can get a steroid injection scheduled Ok to call patient to make appointment for a right knee injection. I have told him to expect a call to schedule

## 2016-06-08 NOTE — Telephone Encounter (Signed)
Patient's husband called to see if patient would be able to get an injection in her RT knee. Patient has had an injection in her Lt. Knee in the past. Please advise.

## 2016-06-09 ENCOUNTER — Ambulatory Visit (INDEPENDENT_AMBULATORY_CARE_PROVIDER_SITE_OTHER): Payer: Commercial Managed Care - PPO | Admitting: Rheumatology

## 2016-06-09 ENCOUNTER — Encounter: Payer: Self-pay | Admitting: Rheumatology

## 2016-06-09 VITALS — BP 152/84 | HR 82 | Resp 15 | Ht 63.0 in | Wt 266.0 lb

## 2016-06-09 DIAGNOSIS — M25562 Pain in left knee: Secondary | ICD-10-CM

## 2016-06-09 DIAGNOSIS — M170 Bilateral primary osteoarthritis of knee: Secondary | ICD-10-CM | POA: Insufficient documentation

## 2016-06-09 DIAGNOSIS — G8929 Other chronic pain: Secondary | ICD-10-CM

## 2016-06-09 DIAGNOSIS — Z79899 Other long term (current) drug therapy: Secondary | ICD-10-CM | POA: Diagnosis not present

## 2016-06-09 DIAGNOSIS — M25561 Pain in right knee: Secondary | ICD-10-CM

## 2016-06-09 DIAGNOSIS — E669 Obesity, unspecified: Secondary | ICD-10-CM

## 2016-06-09 DIAGNOSIS — M1A0420 Idiopathic chronic gout, left hand, without tophus (tophi): Secondary | ICD-10-CM | POA: Diagnosis not present

## 2016-06-09 LAB — COMPLETE METABOLIC PANEL WITH GFR
AG Ratio: 1.2 Ratio (ref 1.0–2.5)
ALT: 35 U/L — AB (ref 6–29)
AST: 22 U/L (ref 10–35)
Albumin: 4 g/dL (ref 3.6–5.1)
Alkaline Phosphatase: 94 U/L (ref 33–130)
BUN / CREAT RATIO: 16.1 ratio (ref 6–22)
BUN: 14 mg/dL (ref 7–25)
CALCIUM: 9.9 mg/dL (ref 8.6–10.4)
CHLORIDE: 99 mmol/L (ref 98–110)
CO2: 31 mmol/L (ref 20–31)
CREATININE: 0.87 mg/dL (ref 0.50–0.99)
GFR, Est African American: 84 mL/min (ref >=60)
GFR, Est Non African American: 73 mL/min (ref >=60)
GLOBULIN: 3.4 g/dL (ref 1.9–3.7)
Glucose, Bld: 109 mg/dL — ABNORMAL HIGH (ref 65–99)
Potassium: 3.9 mmol/L (ref 3.5–5.3)
SODIUM: 141 mmol/L (ref 135–146)
TOTAL PROTEIN: 7.4 g/dL (ref 6.1–8.1)
Total Bilirubin: 0.4 mg/dL (ref 0.2–1.2)

## 2016-06-09 LAB — CBC WITH DIFFERENTIAL/PLATELET
BASOS PCT: 0 %
Basophils Absolute: 0 cells/uL (ref 0–200)
EOS ABS: 452 {cells}/uL (ref 15–500)
Eosinophils Relative: 4 %
HCT: 39.2 % (ref 35.0–45.0)
Hemoglobin: 13.1 g/dL (ref 11.7–15.5)
Lymphocytes Relative: 29 %
Lymphs Abs: 3277 cells/uL (ref 850–3900)
MCH: 28.9 pg (ref 27.0–33.0)
MCHC: 33.4 g/dL (ref 32.0–36.0)
MCV: 86.3 fL (ref 80.0–100.0)
MONOS PCT: 8 %
MPV: 8.9 fL (ref 7.5–12.5)
Monocytes Absolute: 904 cells/uL (ref 200–950)
NEUTROS ABS: 6667 {cells}/uL (ref 1500–7800)
Neutrophils Relative %: 59 %
PLATELETS: 329 10*3/uL (ref 140–400)
RBC: 4.54 MIL/uL (ref 3.80–5.10)
RDW: 15.4 % — ABNORMAL HIGH (ref 11.0–15.0)
WBC: 11.3 10*3/uL — ABNORMAL HIGH (ref 3.8–10.8)

## 2016-06-09 MED ORDER — ALLOPURINOL 300 MG PO TABS: 300.0000 mg | ORAL_TABLET | Freq: Every day | ORAL | 1 refills | Status: DC
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
## 2016-06-12 ENCOUNTER — Telehealth: Payer: Self-pay | Admitting: Radiology

## 2016-06-12 NOTE — Telephone Encounter (Signed)
Advised patients husband, he is on her 79

## 2016-06-12 NOTE — Telephone Encounter (Signed)
-----   Message from Eliezer Lofts, [place] sent at 06/12/2016  4:46 PM EDT ----- Send copy of labs to Dr. Wendall Stade, PCP And tell patient #1: CMP with GFR is within normal limits except mild elevation of ALT. (We will monitor). #2: CBC with differential is within normal limits except mild elevation of the WBC count at 11.3. Patient can discuss with PCP if she has any infections going on

## 2016-07-04 ENCOUNTER — Telehealth (INDEPENDENT_AMBULATORY_CARE_PROVIDER_SITE_OTHER): Payer: Self-pay | Admitting: Radiology

## 2016-07-04 NOTE — Telephone Encounter (Signed)
FW: Apply for Visco bilateral knees; Hyalgan preferred but Euflexxa Orthovisc are acceptable  Due: 1 week ago  Received: 1 week ago  Message Contents  Shona Needles, RT  Rilynn Habel, RT        new   Previous Messages    ----- Message -----  From: Eliezer Lofts, PA-C  Sent: 06/09/2016  9:42 AM  To: Shona Needles, RT  Subject: Apply for Visco bilateral knees; Hyalgan pre*   Apply for Visco bilateral knees; Hyalgan preferred but Euflexxa Orthovisc are acceptable       See ins in chart, it is [employer]?? Then apply, thanks.

## 2016-08-02 ENCOUNTER — Telehealth (INDEPENDENT_AMBULATORY_CARE_PROVIDER_SITE_OTHER): Payer: Self-pay

## 2016-08-02 NOTE — Telephone Encounter (Signed)
Faxed Hyalgan benefits application to [phone].

## 2016-08-02 NOTE — Telephone Encounter (Signed)
Faxed Hyalgan benefits application to [phone].

## 2016-09-05 ENCOUNTER — Telehealth (INDEPENDENT_AMBULATORY_CARE_PROVIDER_SITE_OTHER): Payer: Self-pay

## 2016-09-05 NOTE — Telephone Encounter (Addendum)
Msg. Made in error
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
## 2016-09-08 ENCOUNTER — Ambulatory Visit (INDEPENDENT_AMBULATORY_CARE_PROVIDER_SITE_OTHER): Payer: Commercial Managed Care - PPO | Admitting: Rheumatology

## 2016-09-08 ENCOUNTER — Encounter: Payer: Self-pay | Admitting: Rheumatology

## 2016-09-08 VITALS — BP 126/80 | HR 76 | Resp 14 | Ht 63.0 in | Wt 265.0 lb

## 2016-09-08 DIAGNOSIS — Z5181 Encounter for therapeutic drug level monitoring: Secondary | ICD-10-CM

## 2016-09-08 DIAGNOSIS — M25512 Pain in left shoulder: Secondary | ICD-10-CM

## 2016-09-08 DIAGNOSIS — M7552 Bursitis of left shoulder: Secondary | ICD-10-CM | POA: Diagnosis not present

## 2016-09-08 DIAGNOSIS — M1A9XX0 Chronic gout, unspecified, without tophus (tophi): Secondary | ICD-10-CM

## 2016-09-08 LAB — CBC WITH DIFFERENTIAL/PLATELET
BASOS PCT: 0 %
Basophils Absolute: 0 cells/uL (ref 0–200)
EOS PCT: 4 %
Eosinophils Absolute: 420 cells/uL (ref 15–500)
HEMATOCRIT: 37.4 % (ref 35.0–45.0)
HEMOGLOBIN: 12.5 g/dL (ref 11.7–15.5)
LYMPHS ABS: 2730 {cells}/uL (ref 850–3900)
Lymphocytes Relative: 26 %
MCH: 29.1 pg (ref 27.0–33.0)
MCHC: 33.4 g/dL (ref 32.0–36.0)
MCV: 87.2 fL (ref 80.0–100.0)
MONO ABS: 735 {cells}/uL (ref 200–950)
MPV: 8.9 fL (ref 7.5–12.5)
Monocytes Relative: 7 %
NEUTROS ABS: 6615 {cells}/uL (ref 1500–7800)
NEUTROS PCT: 63 %
Platelets: 319 10*3/uL (ref 140–400)
RBC: 4.29 MIL/uL (ref 3.80–5.10)
RDW: 15.4 % — ABNORMAL HIGH (ref 11.0–15.0)
WBC: 10.5 10*3/uL (ref 3.8–10.8)

## 2016-09-08 MED ORDER — LIDOCAINE HCL 1 % IJ SOLN
1.5000 mL | INTRAMUSCULAR | Status: AC | PRN
  Administered 2016-09-08: 1.5 mL

## 2016-09-08 MED ORDER — ALLOPURINOL 300 MG PO TABS
300.0000 mg | ORAL_TABLET | Freq: Every day | ORAL | 1 refills | Status: DC
Start: 2016-09-08 — End: 2017-02-22

## 2016-09-08 MED ORDER — TRIAMCINOLONE ACETONIDE 40 MG/ML IJ SUSP
40.0000 mg | INTRAMUSCULAR | Status: AC | PRN
  Administered 2016-09-08: 40 mg via INTRA_ARTICULAR

## 2016-09-09 LAB — COMPLETE METABOLIC PANEL WITH GFR
ALBUMIN: 3.9 g/dL (ref 3.6–5.1)
ALK PHOS: 90 U/L (ref 33–130)
ALT: 28 U/L (ref 6–29)
AST: 21 U/L (ref 10–35)
BUN: 12 mg/dL (ref 7–25)
CALCIUM: 9.3 mg/dL (ref 8.6–10.4)
CO2: 25 mmol/L (ref 20–31)
CREATININE: 0.86 mg/dL (ref 0.50–0.99)
Chloride: 102 mmol/L (ref 98–110)
GFR, Est African American: 85 mL/min (ref >=60)
GFR, Est Non African American: 74 mL/min (ref >=60)
GLUCOSE: 110 mg/dL — AB (ref 65–99)
Potassium: 3.6 mmol/L (ref 3.5–5.3)
SODIUM: 140 mmol/L (ref 135–146)
TOTAL PROTEIN: 7.1 g/dL (ref 6.1–8.1)
Total Bilirubin: 0.4 mg/dL (ref 0.2–1.2)

## 2016-09-09 LAB — URIC ACID: URIC ACID, SERUM: 4.6 mg/dL (ref 2.5–7.0)

## 2016-09-11 NOTE — Progress Notes (Signed)
WNL

## 2016-09-13 ENCOUNTER — Other Ambulatory Visit: Payer: Self-pay | Admitting: Family Medicine

## 2016-09-13 DIAGNOSIS — N6489 Other specified disorders of breast: Secondary | ICD-10-CM

## 2016-09-14 ENCOUNTER — Ambulatory Visit: Payer: Commercial Managed Care - PPO | Admitting: Rheumatology

## 2016-09-29 ENCOUNTER — Ambulatory Visit
Admission: RE | Admit: 2016-09-29 | Discharge: 2016-09-29 | Disposition: A | Discharge status: Home / Self Care | Payer: Commercial Managed Care - PPO | Source: Ambulatory Visit | Attending: Family Medicine | Admitting: Family Medicine

## 2016-09-29 DIAGNOSIS — N6489 Other specified disorders of breast: Secondary | ICD-10-CM

## 2016-11-08 ENCOUNTER — Encounter (HOSPITAL_COMMUNITY): Payer: Self-pay | Admitting: Emergency Medicine

## 2016-11-08 ENCOUNTER — Emergency Department (HOSPITAL_COMMUNITY): Payer: Commercial Managed Care - PPO

## 2016-11-08 DIAGNOSIS — R072 Precordial pain: Secondary | ICD-10-CM | POA: Diagnosis not present

## 2016-11-08 DIAGNOSIS — Z91040 Latex allergy status: Secondary | ICD-10-CM | POA: Insufficient documentation

## 2016-11-08 DIAGNOSIS — Z7984 Long term (current) use of oral hypoglycemic drugs: Secondary | ICD-10-CM | POA: Insufficient documentation

## 2016-11-08 DIAGNOSIS — H9203 Otalgia, bilateral: Secondary | ICD-10-CM | POA: Diagnosis not present

## 2016-11-08 DIAGNOSIS — M25511 Pain in right shoulder: Secondary | ICD-10-CM | POA: Insufficient documentation

## 2016-11-08 DIAGNOSIS — I10 Essential (primary) hypertension: Secondary | ICD-10-CM | POA: Diagnosis not present

## 2016-11-08 DIAGNOSIS — Z79899 Other long term (current) drug therapy: Secondary | ICD-10-CM | POA: Insufficient documentation

## 2016-11-08 DIAGNOSIS — E119 Type 2 diabetes mellitus without complications: Secondary | ICD-10-CM | POA: Diagnosis not present

## 2016-11-08 DIAGNOSIS — Z85020 Personal history of malignant carcinoid tumor of stomach: Secondary | ICD-10-CM | POA: Diagnosis not present

## 2016-11-08 DIAGNOSIS — M25512 Pain in left shoulder: Secondary | ICD-10-CM | POA: Insufficient documentation

## 2016-11-08 DIAGNOSIS — Z85528 Personal history of other malignant neoplasm of kidney: Secondary | ICD-10-CM | POA: Diagnosis not present

## 2016-11-08 DIAGNOSIS — R079 Chest pain, unspecified: Secondary | ICD-10-CM | POA: Diagnosis present

## 2016-11-08 LAB — I-STAT TROPONIN, ED: TROPONIN I, POC: 0 ng/mL (ref 0.00–0.08)

## 2016-11-08 LAB — CBC
HEMATOCRIT: 38.5 % (ref 36.0–46.0)
HEMOGLOBIN: 12.6 g/dL (ref 12.0–15.0)
MCH: 28.6 pg (ref 26.0–34.0)
MCHC: 32.7 g/dL (ref 30.0–36.0)
MCV: 87.5 fL (ref 78.0–100.0)
Platelets: 297 10*3/uL (ref 150–400)
RBC: 4.4 MIL/uL (ref 3.87–5.11)
RDW: 15.2 % (ref 11.5–15.5)
WBC: 13.1 10*3/uL — AB (ref 4.0–10.5)

## 2016-11-08 NOTE — ED Triage Notes (Signed)
Patient arrived with EMS from home reports multiple complaints : right ear ache , right neck pain , bilateral shoulder pain and central chest pain worse with deep inspiration onset today , denies SOB / no nausea or diaphoresis .

## 2016-11-09 ENCOUNTER — Emergency Department (HOSPITAL_COMMUNITY)
Admission: EM | Admit: 2016-11-09 | Discharge: 2016-11-09 | Disposition: A | Discharge status: Home / Self Care | Payer: Commercial Managed Care - PPO | Attending: Emergency Medicine | Admitting: Emergency Medicine

## 2016-11-09 DIAGNOSIS — H9203 Otalgia, bilateral: Secondary | ICD-10-CM

## 2016-11-09 DIAGNOSIS — M25512 Pain in left shoulder: Secondary | ICD-10-CM

## 2016-11-09 DIAGNOSIS — R072 Precordial pain: Secondary | ICD-10-CM

## 2016-11-09 DIAGNOSIS — M25511 Pain in right shoulder: Secondary | ICD-10-CM

## 2016-11-09 LAB — BASIC METABOLIC PANEL
ANION GAP: 12 (ref 5–15)
BUN: 13 mg/dL (ref 6–20)
CALCIUM: 9.2 mg/dL (ref 8.9–10.3)
CO2: 26 mmol/L (ref 22–32)
Chloride: 98 mmol/L — ABNORMAL LOW (ref 101–111)
Creatinine, Ser: 1.17 mg/dL — ABNORMAL HIGH (ref 0.44–1.00)
GFR calc Af Amer: 57 mL/min — ABNORMAL LOW (ref >60)
GFR, EST NON AFRICAN AMERICAN: 50 mL/min — AB (ref >60)
GLUCOSE: 154 mg/dL — AB (ref 65–99)
Potassium: 3 mmol/L — ABNORMAL LOW (ref 3.5–5.1)
Sodium: 136 mmol/L (ref 135–145)

## 2016-11-09 MED ORDER — POTASSIUM CHLORIDE CRYS ER 20 MEQ PO TBCR
40.0000 meq | EXTENDED_RELEASE_TABLET | Freq: Once | ORAL | Status: AC
  Administered 2016-11-09: 40 meq via ORAL
  Filled 2016-11-09: qty 2

## 2016-11-09 MED ORDER — HYDROCODONE-ACETAMINOPHEN 5-325 MG PO TABS
2.0000 | ORAL_TABLET | Freq: Once | ORAL | Status: AC
  Administered 2016-11-09: 2 via ORAL
  Filled 2016-11-09: qty 2

## 2016-11-09 MED ORDER — ONDANSETRON 4 MG PO TBDP
8.0000 mg | ORAL_TABLET | Freq: Once | ORAL | Status: AC
  Administered 2016-11-09: 8 mg via ORAL
  Filled 2016-11-09: qty 2

## 2016-11-09 NOTE — ED Notes (Signed)
While ambulating pt O2 was 90-93%.

## 2016-11-09 NOTE — Discharge Instructions (Signed)
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
## 2016-11-09 NOTE — ED Notes (Signed)
ED Provider at bedside. 

## 2017-01-03 ENCOUNTER — Other Ambulatory Visit (INDEPENDENT_AMBULATORY_CARE_PROVIDER_SITE_OTHER): Payer: Self-pay | Admitting: *Deleted

## 2017-01-03 ENCOUNTER — Telehealth (INDEPENDENT_AMBULATORY_CARE_PROVIDER_SITE_OTHER): Payer: Self-pay

## 2017-01-03 NOTE — Telephone Encounter (Signed)
Ria Comment with [phone] called stating that a Rx was received for Euflexxa for bilateral knee injection and that it had an E-signature that was faxed that can not be accepted without office confirmation.  Cb# is [phone].  Please advise.  Thank you.
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
## 2017-01-11 ENCOUNTER — Telehealth: Payer: Self-pay | Admitting: *Deleted

## 2017-01-11 NOTE — Telephone Encounter (Signed)
Euflexxa delivered to office 01/11/17, patient purchase, bilateral, appts scheduled

## 2017-01-12 ENCOUNTER — Ambulatory Visit (INDEPENDENT_AMBULATORY_CARE_PROVIDER_SITE_OTHER): Payer: Commercial Managed Care - PPO | Admitting: Rheumatology

## 2017-01-12 ENCOUNTER — Ambulatory Visit: Payer: Commercial Managed Care - PPO | Admitting: Student

## 2017-01-12 ENCOUNTER — Encounter: Payer: Self-pay | Admitting: Student

## 2017-01-12 VITALS — BP 133/81 | HR 72 | Ht 63.0 in | Wt 263.2 lb

## 2017-01-12 DIAGNOSIS — M25562 Pain in left knee: Principal | ICD-10-CM

## 2017-01-12 DIAGNOSIS — G8929 Other chronic pain: Secondary | ICD-10-CM

## 2017-01-12 DIAGNOSIS — I10 Essential (primary) hypertension: Secondary | ICD-10-CM | POA: Diagnosis not present

## 2017-01-12 DIAGNOSIS — R0609 Other forms of dyspnea: Secondary | ICD-10-CM

## 2017-01-12 DIAGNOSIS — Z905 Acquired absence of kidney: Secondary | ICD-10-CM

## 2017-01-12 DIAGNOSIS — M1711 Unilateral primary osteoarthritis, right knee: Secondary | ICD-10-CM

## 2017-01-12 DIAGNOSIS — R5382 Chronic fatigue, unspecified: Secondary | ICD-10-CM

## 2017-01-12 DIAGNOSIS — M170 Bilateral primary osteoarthritis of knee: Secondary | ICD-10-CM

## 2017-01-12 DIAGNOSIS — M1712 Unilateral primary osteoarthritis, left knee: Secondary | ICD-10-CM | POA: Diagnosis not present

## 2017-01-12 DIAGNOSIS — R0600 Dyspnea, unspecified: Secondary | ICD-10-CM

## 2017-01-12 DIAGNOSIS — M25561 Pain in right knee: Principal | ICD-10-CM

## 2017-01-12 LAB — PRO B NATRIURETIC PEPTIDE: NT-Pro BNP: 47 pg/mL (ref 0–287)

## 2017-01-12 LAB — BASIC METABOLIC PANEL
BUN/Creatinine Ratio: 14 (ref 12–28)
BUN: 12 mg/dL (ref 8–27)
CO2: 26 mmol/L (ref 20–29)
CREATININE: 0.84 mg/dL (ref 0.57–1.00)
Calcium: 9.6 mg/dL (ref 8.7–10.3)
Chloride: 98 mmol/L (ref 96–106)
GFR calc Af Amer: 87 mL/min/{1.73_m2} (ref >59)
GFR, EST NON AFRICAN AMERICAN: 76 mL/min/{1.73_m2} (ref >59)
Glucose: 138 mg/dL — ABNORMAL HIGH (ref 65–99)
Potassium: 4 mmol/L (ref 3.5–5.2)
SODIUM: 140 mmol/L (ref 134–144)

## 2017-01-12 LAB — TSH: TSH: 2.07 u[IU]/mL (ref 0.450–4.500)

## 2017-01-12 MED ORDER — SODIUM HYALURONATE (VISCOSUP) 20 MG/2ML IX SOSY
20.0000 mg | PREFILLED_SYRINGE | INTRA_ARTICULAR | Status: AC | PRN
  Administered 2017-01-12: 20 mg via INTRA_ARTICULAR

## 2017-01-12 MED ORDER — LIDOCAINE HCL 1 % IJ SOLN
1.5000 mL | INTRAMUSCULAR | Status: AC | PRN
  Administered 2017-01-12: 1.5 mL

## 2017-01-12 NOTE — Patient Instructions (Signed)
Medication Instructions: Bernerd Pho, PA-C, recommends that you continue on your current medications as directed. Please refer to the Current Medication list given to you today.  Labwork: Your physician recommends that you return for lab work TODAY.  Testing/Procedures: 1. Echocardiogram - Your physician has requested that you have an echocardiogram. Echocardiography is a painless test that uses sound waves to create images of your heart. It provides your doctor with information about the size and shape of your heart and how well your heart's chambers and valves are working. This procedure takes approximately one hour. There are no restrictions for this procedure. >>This will be performed at our [facility] location - [address], [address]  Follow-up: [place] recommends that you schedule a follow-up appointment in 6 months with Dr Martinique. You will receive a reminder letter in the mail two months in advance. If you don't receive a letter, please call our office to schedule the follow-up appointment.  If you need a refill on your cardiac medications before your next appointment, please call your pharmacy.

## 2017-01-12 NOTE — Addendum Note (Signed)
Addended by: Bo Merino on: 01/12/2017 12:49 PM   Modules accepted: Level of Service

## 2017-01-12 NOTE — Progress Notes (Signed)
   Procedure Note  Patient: [name]             Date of Birth: 1955-07-29           MRN: [id number]             Visit Date: 01/12/2017  Procedures: Visit Diagnoses: Chronic pain of both knees  Primary osteoarthritis of both knees  Large Joint Inj: bilateral knee on 01/12/2017 12:39 PM Indications: pain Details: 27 G 1.5 in needle, medial approach  Arthrogram: No  Medications (Right): 1.5 mL lidocaine 1 %; 20 mg Sodium Hyaluronate 20 MG/2ML Aspirate (Right): 0 mL Medications (Left): 1.5 mL lidocaine 1 %; 20 mg Sodium Hyaluronate 20 MG/2ML Aspirate (Left): 0 mL Outcome: tolerated well, no immediate complications Procedure, treatment alternatives, risks and benefits explained, specific risks discussed. Consent was given by the patient. Immediately prior to procedure a time out was called to verify the correct patient, procedure, equipment, support staff and site/side marked as required. Patient was prepped and draped in the usual sterile fashion.     Bo Merino, MD

## 2017-01-17 ENCOUNTER — Other Ambulatory Visit: Payer: Self-pay | Admitting: Rheumatology

## 2017-01-17 NOTE — Telephone Encounter (Signed)
Last Visit: 09/08/16 Next visit: 02/22/17 Labs: 01/12/17 WNL  Okay to refill per Dr. Estanislado Pandy

## 2017-01-18 ENCOUNTER — Other Ambulatory Visit: Payer: Self-pay

## 2017-01-18 ENCOUNTER — Ambulatory Visit (HOSPITAL_COMMUNITY): Payer: Commercial Managed Care - PPO | Attending: Cardiovascular Disease

## 2017-01-18 DIAGNOSIS — R0609 Other forms of dyspnea: Secondary | ICD-10-CM

## 2017-01-18 DIAGNOSIS — I10 Essential (primary) hypertension: Secondary | ICD-10-CM | POA: Diagnosis not present

## 2017-01-18 DIAGNOSIS — R0600 Dyspnea, unspecified: Secondary | ICD-10-CM

## 2017-01-19 ENCOUNTER — Ambulatory Visit (INDEPENDENT_AMBULATORY_CARE_PROVIDER_SITE_OTHER): Payer: Commercial Managed Care - PPO | Admitting: Rheumatology

## 2017-01-19 DIAGNOSIS — M1712 Unilateral primary osteoarthritis, left knee: Secondary | ICD-10-CM

## 2017-01-19 DIAGNOSIS — M170 Bilateral primary osteoarthritis of knee: Secondary | ICD-10-CM

## 2017-01-19 DIAGNOSIS — M1711 Unilateral primary osteoarthritis, right knee: Secondary | ICD-10-CM | POA: Diagnosis not present

## 2017-01-19 MED ORDER — LIDOCAINE HCL 1 % IJ SOLN
1.5000 mL | INTRAMUSCULAR | Status: AC | PRN
  Administered 2017-01-19: 1.5 mL

## 2017-01-19 MED ORDER — SODIUM HYALURONATE (VISCOSUP) 20 MG/2ML IX SOSY
20.0000 mg | PREFILLED_SYRINGE | INTRA_ARTICULAR | Status: AC | PRN
  Administered 2017-01-19: 20 mg via INTRA_ARTICULAR

## 2017-01-19 NOTE — Progress Notes (Signed)
   Procedure Note  Patient: [name]             Date of Birth: 01/14/1956           MRN: [id number]             Visit Date: 01/19/2017  Procedures: Visit Diagnoses: No diagnosis found. Euflexxa #2 bilateral  Large Joint Inj: bilateral knee on 01/19/2017 8:02 AM Indications: pain Details: 27 G 1.5 in needle, medial approach  Arthrogram: No  Medications (Right): 20 mg Sodium Hyaluronate 20 MG/2ML; 1.5 mL lidocaine 1 % Aspirate (Right): 0 mL Medications (Left): 20 mg Sodium Hyaluronate 20 MG/2ML; 1.5 mL lidocaine 1 % Aspirate (Left): 0 mL Outcome: tolerated well, no immediate complications Procedure, treatment alternatives, risks and benefits explained, specific risks discussed. Consent was given by the patient. Immediately prior to procedure a time out was called to verify the correct patient, procedure, equipment, support staff and site/side marked as required. Patient was prepped and draped in the usual sterile fashion.     Bo Merino, MD

## 2017-01-26 ENCOUNTER — Ambulatory Visit (INDEPENDENT_AMBULATORY_CARE_PROVIDER_SITE_OTHER): Payer: Commercial Managed Care - PPO | Admitting: Rheumatology

## 2017-01-26 DIAGNOSIS — M1712 Unilateral primary osteoarthritis, left knee: Secondary | ICD-10-CM

## 2017-01-26 DIAGNOSIS — M1711 Unilateral primary osteoarthritis, right knee: Secondary | ICD-10-CM

## 2017-01-26 DIAGNOSIS — M170 Bilateral primary osteoarthritis of knee: Secondary | ICD-10-CM

## 2017-01-26 MED ORDER — SODIUM HYALURONATE (VISCOSUP) 20 MG/2ML IX SOSY
20.0000 mg | PREFILLED_SYRINGE | INTRA_ARTICULAR | Status: AC | PRN
  Administered 2017-01-26: 20 mg via INTRA_ARTICULAR

## 2017-01-26 MED ORDER — LIDOCAINE HCL 1 % IJ SOLN
1.5000 mL | INTRAMUSCULAR | Status: AC | PRN
  Administered 2017-01-26: 1.5 mL

## 2017-01-26 NOTE — Progress Notes (Signed)
   Procedure Note  Patient: KAIREE KOZMA             Date of Birth: October 30, 1955           MRN: [id number]             Visit Date: 01/26/2017  Procedures: Visit Diagnoses: Primary osteoarthritis of both knees - Plan: Large Joint Inj: bilateral knee Euflexxa #3 Bilateral  Large Joint Inj: bilateral knee on 01/26/2017 8:04 AM Indications: pain Details: 27 G 1.5 in needle, medial approach  Arthrogram: No  Medications (Right): 20 mg Sodium Hyaluronate 20 MG/2ML; 1.5 mL lidocaine 1 % Aspirate (Right): 0 mL Medications (Left): 20 mg Sodium Hyaluronate 20 MG/2ML; 1.5 mL lidocaine 1 % Aspirate (Left): 0 mL Outcome: tolerated well, no immediate complications Procedure, treatment alternatives, risks and benefits explained, specific risks discussed. Consent was given by the patient. Immediately prior to procedure a time out was called to verify the correct patient, procedure, equipment, support staff and site/side marked as required. Patient was prepped and draped in the usual sterile fashion.     Bo Merino, MD
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
## 2017-02-22 ENCOUNTER — Encounter: Payer: Self-pay | Admitting: Rheumatology

## 2017-02-22 ENCOUNTER — Ambulatory Visit: Payer: Commercial Managed Care - PPO | Admitting: Rheumatology

## 2017-02-22 VITALS — BP 130/84 | HR 84 | Resp 18 | Ht 63.0 in | Wt 263.0 lb

## 2017-02-22 DIAGNOSIS — M5030 Other cervical disc degeneration, unspecified cervical region: Secondary | ICD-10-CM | POA: Diagnosis not present

## 2017-02-22 DIAGNOSIS — Z85528 Personal history of other malignant neoplasm of kidney: Secondary | ICD-10-CM | POA: Diagnosis not present

## 2017-02-22 DIAGNOSIS — Z79899 Other long term (current) drug therapy: Secondary | ICD-10-CM

## 2017-02-22 DIAGNOSIS — M19041 Primary osteoarthritis, right hand: Secondary | ICD-10-CM | POA: Diagnosis not present

## 2017-02-22 DIAGNOSIS — R5383 Other fatigue: Secondary | ICD-10-CM | POA: Diagnosis not present

## 2017-02-22 DIAGNOSIS — M19042 Primary osteoarthritis, left hand: Secondary | ICD-10-CM

## 2017-02-22 DIAGNOSIS — M1A09X0 Idiopathic chronic gout, multiple sites, without tophus (tophi): Secondary | ICD-10-CM

## 2017-02-22 DIAGNOSIS — E790 Hyperuricemia without signs of inflammatory arthritis and tophaceous disease: Secondary | ICD-10-CM

## 2017-02-22 DIAGNOSIS — K319 Disease of stomach and duodenum, unspecified: Secondary | ICD-10-CM

## 2017-02-22 DIAGNOSIS — M19071 Primary osteoarthritis, right ankle and foot: Secondary | ICD-10-CM | POA: Diagnosis not present

## 2017-02-22 DIAGNOSIS — M5136 Other intervertebral disc degeneration, lumbar region: Secondary | ICD-10-CM | POA: Diagnosis not present

## 2017-02-22 DIAGNOSIS — K760 Fatty (change of) liver, not elsewhere classified: Secondary | ICD-10-CM | POA: Diagnosis not present

## 2017-02-22 DIAGNOSIS — Z8659 Personal history of other mental and behavioral disorders: Secondary | ICD-10-CM

## 2017-02-22 DIAGNOSIS — M170 Bilateral primary osteoarthritis of knee: Secondary | ICD-10-CM

## 2017-02-22 DIAGNOSIS — M8589 Other specified disorders of bone density and structure, multiple sites: Secondary | ICD-10-CM

## 2017-02-22 DIAGNOSIS — Z8639 Personal history of other endocrine, nutritional and metabolic disease: Secondary | ICD-10-CM

## 2017-02-22 DIAGNOSIS — K3189 Other diseases of stomach and duodenum: Secondary | ICD-10-CM

## 2017-02-22 DIAGNOSIS — M51369 Other intervertebral disc degeneration, lumbar region without mention of lumbar back pain or lower extremity pain: Secondary | ICD-10-CM

## 2017-02-22 DIAGNOSIS — Z8679 Personal history of other diseases of the circulatory system: Secondary | ICD-10-CM

## 2017-02-22 DIAGNOSIS — M19072 Primary osteoarthritis, left ankle and foot: Secondary | ICD-10-CM

## 2017-02-22 MED ORDER — ALLOPURINOL 300 MG PO TABS: 300.0000 mg | ORAL_TABLET | Freq: Every day | ORAL | 1 refills | Status: DC

## 2017-02-23 LAB — COMPLETE METABOLIC PANEL WITH GFR
AG RATIO: 1.1 (calc) (ref 1.0–2.5)
ALT: 16 U/L (ref 6–29)
AST: 16 U/L (ref 10–35)
Albumin: 4 g/dL (ref 3.6–5.1)
Alkaline phosphatase (APISO): 84 U/L (ref 33–130)
BILIRUBIN TOTAL: 0.3 mg/dL (ref 0.2–1.2)
BUN: 12 mg/dL (ref 7–25)
CALCIUM: 9.7 mg/dL (ref 8.6–10.4)
CO2: 31 mmol/L (ref 20–32)
Chloride: 102 mmol/L (ref 98–110)
Creat: 0.86 mg/dL (ref 0.50–0.99)
GFR, Est African American: 85 mL/min/{1.73_m2} (ref >=60)
GFR, Est Non African American: 73 mL/min/{1.73_m2} (ref >=60)
Globulin: 3.5 g/dL (calc) (ref 1.9–3.7)
Glucose, Bld: 125 mg/dL — ABNORMAL HIGH (ref 65–99)
POTASSIUM: 3.6 mmol/L (ref 3.5–5.3)
Sodium: 139 mmol/L (ref 135–146)
Total Protein: 7.5 g/dL (ref 6.1–8.1)

## 2017-02-23 LAB — CBC WITH DIFFERENTIAL/PLATELET
Basophils Absolute: 65 cells/uL (ref 0–200)
Basophils Relative: 0.6 %
Eosinophils Absolute: 497 cells/uL (ref 15–500)
Eosinophils Relative: 4.6 %
HCT: 36.4 % (ref 35.0–45.0)
Hemoglobin: 12.6 g/dL (ref 11.7–15.5)
Lymphs Abs: 2959 cells/uL (ref 850–3900)
MCH: 28.8 pg (ref 27.0–33.0)
MCHC: 34.6 g/dL (ref 32.0–36.0)
MCV: 83.1 fL (ref 80.0–100.0)
MPV: 9.4 fL (ref 7.5–12.5)
Monocytes Relative: 6.1 %
NEUTROS PCT: 61.3 %
Neutro Abs: 6620 cells/uL (ref 1500–7800)
PLATELETS: 315 10*3/uL (ref 140–400)
RBC: 4.38 10*6/uL (ref 3.80–5.10)
RDW: 14.3 % (ref 11.0–15.0)
TOTAL LYMPHOCYTE: 27.4 %
WBC: 10.8 10*3/uL (ref 3.8–10.8)
WBCMIX: 659 {cells}/uL (ref 200–950)

## 2017-02-23 LAB — URIC ACID: URIC ACID, SERUM: 4.4 mg/dL (ref 2.5–7.0)

## 2017-02-23 LAB — VITAMIN D 25 HYDROXY (VIT D DEFICIENCY, FRACTURES): VIT D 25 HYDROXY: 13 ng/mL — AB (ref 30–100)

## 2017-02-23 NOTE — Progress Notes (Signed)
Vitamin D 50,000 units twice a week for 3 months. Recheck level in 3 months.

## 2017-02-26 ENCOUNTER — Telehealth: Payer: Self-pay | Admitting: *Deleted

## 2017-02-26 DIAGNOSIS — E559 Vitamin D deficiency, unspecified: Secondary | ICD-10-CM

## 2017-02-26 MED ORDER — VITAMIN D (ERGOCALCIFEROL) 1.25 MG (50000 UNIT) PO CAPS: 50000.0000 [IU] | ORAL_CAPSULE | ORAL | 0 refills | Status: DC

## 2017-02-26 NOTE — Telephone Encounter (Signed)
-----   Message from Bo Merino, MD sent at 02/23/2017  1:54 PM EST ----- Vitamin D 50,000 units twice a week for 3 months. Recheck level in 3 months.

## 2017-03-06 DIAGNOSIS — G4733 Obstructive sleep apnea (adult) (pediatric): Secondary | ICD-10-CM | POA: Diagnosis not present

## 2017-03-08 DIAGNOSIS — M545 Low back pain: Secondary | ICD-10-CM | POA: Diagnosis not present

## 2017-03-08 DIAGNOSIS — M542 Cervicalgia: Secondary | ICD-10-CM | POA: Diagnosis not present

## 2017-03-13 DIAGNOSIS — M545 Low back pain: Secondary | ICD-10-CM | POA: Diagnosis not present

## 2017-03-13 DIAGNOSIS — M542 Cervicalgia: Secondary | ICD-10-CM | POA: Diagnosis not present

## 2017-03-15 DIAGNOSIS — M545 Low back pain: Secondary | ICD-10-CM | POA: Diagnosis not present

## 2017-03-15 DIAGNOSIS — M542 Cervicalgia: Secondary | ICD-10-CM | POA: Diagnosis not present

## 2017-03-20 DIAGNOSIS — M545 Low back pain: Secondary | ICD-10-CM | POA: Diagnosis not present

## 2017-03-20 DIAGNOSIS — M542 Cervicalgia: Secondary | ICD-10-CM | POA: Diagnosis not present

## 2017-03-22 DIAGNOSIS — M542 Cervicalgia: Secondary | ICD-10-CM | POA: Diagnosis not present

## 2017-03-22 DIAGNOSIS — M545 Low back pain: Secondary | ICD-10-CM | POA: Diagnosis not present

## 2017-03-28 DIAGNOSIS — M545 Low back pain: Secondary | ICD-10-CM | POA: Diagnosis not present

## 2017-03-28 DIAGNOSIS — M542 Cervicalgia: Secondary | ICD-10-CM | POA: Diagnosis not present

## 2017-03-29 ENCOUNTER — Other Ambulatory Visit: Payer: Self-pay | Admitting: Gastroenterology

## 2017-03-29 DIAGNOSIS — Z86010 Personal history of colonic polyps: Secondary | ICD-10-CM | POA: Diagnosis not present

## 2017-03-29 DIAGNOSIS — Z8719 Personal history of other diseases of the digestive system: Secondary | ICD-10-CM | POA: Diagnosis not present

## 2017-03-29 DIAGNOSIS — Z85020 Personal history of malignant carcinoid tumor of stomach: Secondary | ICD-10-CM | POA: Diagnosis not present

## 2017-03-30 DIAGNOSIS — M542 Cervicalgia: Secondary | ICD-10-CM | POA: Diagnosis not present

## 2017-03-30 DIAGNOSIS — M545 Low back pain: Secondary | ICD-10-CM | POA: Diagnosis not present

## 2017-04-05 DIAGNOSIS — M542 Cervicalgia: Secondary | ICD-10-CM | POA: Diagnosis not present

## 2017-04-05 DIAGNOSIS — M545 Low back pain: Secondary | ICD-10-CM | POA: Diagnosis not present

## 2017-04-07 DIAGNOSIS — M542 Cervicalgia: Secondary | ICD-10-CM | POA: Diagnosis not present

## 2017-04-07 DIAGNOSIS — M545 Low back pain: Secondary | ICD-10-CM | POA: Diagnosis not present

## 2017-04-10 DIAGNOSIS — M545 Low back pain: Secondary | ICD-10-CM | POA: Diagnosis not present

## 2017-04-10 DIAGNOSIS — M542 Cervicalgia: Secondary | ICD-10-CM | POA: Diagnosis not present

## 2017-04-12 DIAGNOSIS — M545 Low back pain: Secondary | ICD-10-CM | POA: Diagnosis not present

## 2017-04-12 DIAGNOSIS — M542 Cervicalgia: Secondary | ICD-10-CM | POA: Diagnosis not present

## 2017-04-19 DIAGNOSIS — M545 Low back pain: Secondary | ICD-10-CM | POA: Diagnosis not present

## 2017-04-19 DIAGNOSIS — M542 Cervicalgia: Secondary | ICD-10-CM | POA: Diagnosis not present

## 2017-04-25 DIAGNOSIS — D126 Benign neoplasm of colon, unspecified: Secondary | ICD-10-CM | POA: Diagnosis not present

## 2017-04-25 DIAGNOSIS — C7A092 Malignant carcinoid tumor of the stomach: Secondary | ICD-10-CM | POA: Diagnosis not present

## 2017-04-25 DIAGNOSIS — K3189 Other diseases of stomach and duodenum: Secondary | ICD-10-CM | POA: Diagnosis not present

## 2017-04-25 DIAGNOSIS — K317 Polyp of stomach and duodenum: Secondary | ICD-10-CM | POA: Diagnosis not present

## 2017-04-25 DIAGNOSIS — Z86010 Personal history of colonic polyps: Secondary | ICD-10-CM | POA: Diagnosis not present

## 2017-05-16 ENCOUNTER — Other Ambulatory Visit: Payer: Self-pay | Admitting: Rheumatology

## 2017-05-16 NOTE — Telephone Encounter (Signed)
Patient advised Vitamin D will need to be rechecked before we can refill. Patient will be in this week.

## 2017-05-28 ENCOUNTER — Telehealth: Payer: Self-pay | Admitting: Rheumatology

## 2017-05-28 ENCOUNTER — Other Ambulatory Visit: Payer: Self-pay | Admitting: *Deleted

## 2017-05-28 DIAGNOSIS — H5203 Hypermetropia, bilateral: Secondary | ICD-10-CM | POA: Diagnosis not present

## 2017-05-28 DIAGNOSIS — H40013 Open angle with borderline findings, low risk, bilateral: Secondary | ICD-10-CM | POA: Diagnosis not present

## 2017-05-28 DIAGNOSIS — E559 Vitamin D deficiency, unspecified: Secondary | ICD-10-CM

## 2017-05-28 NOTE — Telephone Encounter (Signed)
lab order faxed

## 2017-05-28 NOTE — Telephone Encounter (Signed)
Patient requested to have her labwork orders sent to [facility] in [place].

## 2017-06-11 DIAGNOSIS — M25551 Pain in right hip: Secondary | ICD-10-CM | POA: Diagnosis not present

## 2017-06-12 DIAGNOSIS — G4733 Obstructive sleep apnea (adult) (pediatric): Secondary | ICD-10-CM | POA: Diagnosis not present

## 2017-06-14 ENCOUNTER — Encounter: Payer: Self-pay | Admitting: Physician Assistant

## 2017-06-14 ENCOUNTER — Ambulatory Visit: Payer: Commercial Managed Care - PPO | Admitting: Physician Assistant

## 2017-06-14 VITALS — BP 145/83 | HR 75 | Resp 18 | Ht 63.0 in | Wt 262.0 lb

## 2017-06-14 DIAGNOSIS — G5701 Lesion of sciatic nerve, right lower limb: Secondary | ICD-10-CM

## 2017-06-14 DIAGNOSIS — E559 Vitamin D deficiency, unspecified: Secondary | ICD-10-CM | POA: Diagnosis not present

## 2017-06-14 DIAGNOSIS — M5030 Other cervical disc degeneration, unspecified cervical region: Secondary | ICD-10-CM | POA: Diagnosis not present

## 2017-06-14 DIAGNOSIS — M19071 Primary osteoarthritis, right ankle and foot: Secondary | ICD-10-CM | POA: Diagnosis not present

## 2017-06-14 DIAGNOSIS — F419 Anxiety disorder, unspecified: Secondary | ICD-10-CM

## 2017-06-14 DIAGNOSIS — M19041 Primary osteoarthritis, right hand: Secondary | ICD-10-CM | POA: Diagnosis not present

## 2017-06-14 DIAGNOSIS — M8589 Other specified disorders of bone density and structure, multiple sites: Secondary | ICD-10-CM | POA: Diagnosis not present

## 2017-06-14 DIAGNOSIS — M1A09X0 Idiopathic chronic gout, multiple sites, without tophus (tophi): Secondary | ICD-10-CM

## 2017-06-14 DIAGNOSIS — R5383 Other fatigue: Secondary | ICD-10-CM

## 2017-06-14 DIAGNOSIS — M19042 Primary osteoarthritis, left hand: Secondary | ICD-10-CM

## 2017-06-14 DIAGNOSIS — Z85528 Personal history of other malignant neoplasm of kidney: Secondary | ICD-10-CM

## 2017-06-14 DIAGNOSIS — K760 Fatty (change of) liver, not elsewhere classified: Secondary | ICD-10-CM

## 2017-06-14 DIAGNOSIS — K319 Disease of stomach and duodenum, unspecified: Secondary | ICD-10-CM | POA: Diagnosis not present

## 2017-06-14 DIAGNOSIS — M5136 Other intervertebral disc degeneration, lumbar region: Secondary | ICD-10-CM | POA: Diagnosis not present

## 2017-06-14 DIAGNOSIS — M170 Bilateral primary osteoarthritis of knee: Secondary | ICD-10-CM

## 2017-06-14 DIAGNOSIS — E790 Hyperuricemia without signs of inflammatory arthritis and tophaceous disease: Secondary | ICD-10-CM | POA: Diagnosis not present

## 2017-06-14 DIAGNOSIS — F32A Depression, unspecified: Secondary | ICD-10-CM

## 2017-06-14 DIAGNOSIS — M19072 Primary osteoarthritis, left ankle and foot: Secondary | ICD-10-CM

## 2017-06-14 DIAGNOSIS — Z8639 Personal history of other endocrine, nutritional and metabolic disease: Secondary | ICD-10-CM

## 2017-06-14 DIAGNOSIS — F329 Major depressive disorder, single episode, unspecified: Secondary | ICD-10-CM

## 2017-06-14 DIAGNOSIS — K3189 Other diseases of stomach and duodenum: Secondary | ICD-10-CM

## 2017-06-14 DIAGNOSIS — Z8679 Personal history of other diseases of the circulatory system: Secondary | ICD-10-CM

## 2017-06-14 MED ORDER — TRIAMCINOLONE ACETONIDE 40 MG/ML IJ SUSP
40.0000 mg | INTRAMUSCULAR | Status: AC | PRN
  Administered 2017-06-14: 40 mg via INTRAMUSCULAR

## 2017-06-14 MED ORDER — LIDOCAINE HCL 1 % IJ SOLN
1.0000 mL | INTRAMUSCULAR | Status: AC | PRN
Start: 2017-06-14 — End: 2017-06-14
  Administered 2017-06-14: 1 mL
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
## 2017-06-14 NOTE — Patient Instructions (Signed)
Piriformis Syndrome Piriformis syndrome is a condition that can cause pain and numbness in your buttocks and down the back of your leg. Piriformis syndrome happens when the small muscle that connects the base of your spine to your hip (piriformis muscle) presses on the nerve that runs down the back of your leg (sciatic nerve). The piriformis muscle helps your hip rotate and helps to bring your leg back and out. It also helps shift your weight while you are walking to keep you stable. The sciatic nerve runs under or through the piriformis. Damage to the piriformis muscle can cause spasms that put pressure on the nerve below. This causes pain and discomfort while sitting and moving. The pain may feel as if it begins in the buttock and spreads (radiates) down your hip and thigh. What are the causes? This condition is caused by pressure on the sciatic nerve from the piriformis muscle. The piriformis muscle can get irritated with overuse, especially if other hip muscles are weak and the piriformis has to do extra work. Piriformis syndrome can also occur after an injury, like a fall onto your buttocks. What increases the risk? This condition is more likely to develop in:  Women.  People who sit for long periods of time.  Cyclists.  People who have weak buttocks muscles (gluteal muscles).  What are the signs or symptoms? Pain, tingling, or numbness that starts in the buttock and runs down the back of your leg (sciatica) is the most common symptom of this condition. Your symptoms may:  Get worse the longer you sit.  Get worse when you walk, run, or go up on stairs.  How is this diagnosed? This condition is diagnosed based on your symptoms, medical history, and physical exam. During this exam, your health care provider may move your leg into different positions to check for pain. He or she will also press on the muscles of your hip and buttock to see if that increases your symptoms. You may also have  an X-ray or MRI. How is this treated? Treatment for this condition may include:  Stopping all activities that cause pain or make your condition worse.  Using heat or ice to relieve pain as told by your health care provider.  Taking medicines to reduce pain and swelling.  Taking a muscle relaxer to release the piriformis muscle.  Doing range-of-motion and strengthening exercises (physical therapy) as told by your health care provider.  Massaging the affected area.  Getting an injection of an anti-inflammatory medicine or muscle relaxer to reduce inflammation and muscle tension.  In rare cases, you may need surgery to cut the muscle and release pressure on the nerve if other treatments do not work. Follow these instructions at home:  Take over-the-counter and prescription medicines only as told by your health care provider.  Do not sit for long periods. Get up and walk around every 20 minutes or as often as told by your health care provider.  If directed, apply heat to the affected area as often as told by your health care provider. Use the heat source that your health care provider recommends, such as a moist heat pack or a heating pad. ? Place a towel between your skin and the heat source. ? Leave the heat on for 20-30 minutes. ? Remove the heat if your skin turns bright red. This is especially important if you are unable to feel pain, heat, or cold. You may have a greater risk of getting burned.  If   directed, apply ice to the injured area. ? Put ice in a plastic bag. ? Place a towel between your skin and the bag. ? Leave the ice on for 20 minutes, 2-3 times a day.  Do exercises as told by your health care provider.  Return to your normal activities as told by your health care provider. Ask your health care provider what activities are safe for you.  Keep all follow-up visits as told by your health care provider. This is important. How is this prevented?  Do not sit for  longer than 20 minutes at a time. When you sit, choose padded surfaces.  Warm up and stretch before being active.  Cool down and stretch after being active.  Give your body time to rest between periods of activity.  Make sure to use equipment that fits you.  Maintain physical fitness, including: ? Strength. ? Flexibility. Contact a health care provider if:  Your pain and stiffness continue or get worse.  Your leg or hip becomes weak.  You have changes in your bowel function or bladder function. This information is not intended to replace advice given to you by your health care provider. Make sure you discuss any questions you have with your health care provider. Document Released: 01/30/2005 Document Revised: 10/05/2015 Document Reviewed: 01/12/2015 Elsevier Interactive Patient Education  2018 [employer].    Piriformis Syndrome Rehab Ask your health care provider which exercises are safe for you. Do exercises exactly as told by your health care provider and adjust them as directed. It is normal to feel mild stretching, pulling, tightness, or discomfort as you do these exercises, but you should stop right away if you feel sudden pain or your pain gets worse.Do not begin these exercises until told by your health care provider. Stretching and range of motion exercises These exercises warm up your muscles and joints and improve the movement and flexibility of your hip and pelvis. These exercises also help to relieve pain, numbness, and tingling. Exercise A: Hip rotators  1. Lie on your back on a firm surface. 2. Pull your left / right knee toward your same shoulder with your left / right hand until your knee is pointing toward the ceiling. Hold your left / right ankle with your other hand. 3. Keeping your knee steady, gently pull your left / right ankle toward your other shoulder until you feel a stretch in your buttocks. 4. Hold this position for __________ seconds. Repeat  __________ times. Complete this stretch __________ times a day. Exercise B: Hip extensors 1. Lie on your back on a firm surface. Both of your legs should be straight. 2. Pull your left / right knee to your chest. Hold your leg in this position by holding onto the back of your thigh or the front of your knee. 3. Hold this position for __________ seconds. 4. Slowly return to the starting position. Repeat __________ times. Complete this stretch __________ times a day. Strengthening exercises These exercises build strength and endurance in your hip and thigh muscles. Endurance is the ability to use your muscles for a long time, even after they get tired. Exercise C: Straight leg raises ( hip abductors) 1. Lie on your side with your left / right leg in the top position. Lie so your head, shoulder, knee, and hip line up. Bend your bottom knee to help you balance. 2. Lift your top leg up 4-6 inches (10-15 cm), keeping your toes pointed straight ahead. 3. Hold this position for  __________ seconds. 4. Slowly lower your leg to the starting position. Let your muscles relax completely. Repeat __________ times. Complete this exercise__________ times a day. Exercise D: Hip abductors and rotators, quadruped  1. Get on your hands and knees on a firm, lightly padded surface. Your hands should be directly below your shoulders, and your knees should be directly below your hips. 2. Lift your left / right knee out to the side. Keep your knee bent. Do not twist your body. 3. Hold this position for __________ seconds. 4. Slowly lower your leg. Repeat __________ times. Complete this exercise__________ times a day. Exercise E: Straight leg raises ( hip extensors) 1. Lie on your abdomen on a bed or a firm surface with a pillow under your hips. 2. Squeeze your buttock muscles and lift your left / right thigh off the bed. Do not let your back arch. 3. Hold this position for __________ seconds. 4. Slowly return to  the starting position. Let your muscles relax completely before doing another repetition. Repeat __________ times. Complete this exercise__________ times a day. This information is not intended to replace advice given to you by your health care provider. Make sure you discuss any questions you have with your health care provider. Document Released: 01/30/2005 Document Revised: 10/05/2015 Document Reviewed: 01/12/2015 Elsevier Interactive Patient Education  [employer].

## 2017-06-15 LAB — URIC ACID: Uric Acid, Serum: 4.4 mg/dL (ref 2.5–7.0)

## 2017-06-15 LAB — VITAMIN D 25 HYDROXY (VIT D DEFICIENCY, FRACTURES): VIT D 25 HYDROXY: 40 ng/mL (ref 30–100)

## 2017-06-15 NOTE — Progress Notes (Signed)
Vitamin D is WNL.  She can continue taking Vitamin D 2,000 units by mouth daily or 50,000 units by mouth once a month.   Uric acid is within desirable range.

## 2017-06-28 ENCOUNTER — Telehealth: Payer: Self-pay | Admitting: Rheumatology

## 2017-06-28 NOTE — Telephone Encounter (Signed)
Patient's husband called stating [name] had a cortisone shot in her right hip at last appointment on 06/14/17.  Patient states it was better for a few days but has gotten worse.  Patient is requesting a return call to discuss what to do next.
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
## 2017-06-28 NOTE — Telephone Encounter (Signed)
LMAM for patient to call back.

## 2017-06-29 ENCOUNTER — Encounter: Payer: Self-pay | Admitting: Physician Assistant

## 2017-06-29 ENCOUNTER — Ambulatory Visit (INDEPENDENT_AMBULATORY_CARE_PROVIDER_SITE_OTHER): Payer: Commercial Managed Care - PPO | Admitting: Physician Assistant

## 2017-06-29 VITALS — BP 151/87 | HR 82 | Resp 17 | Ht 63.0 in | Wt 258.0 lb

## 2017-06-29 DIAGNOSIS — M5030 Other cervical disc degeneration, unspecified cervical region: Secondary | ICD-10-CM | POA: Diagnosis not present

## 2017-06-29 DIAGNOSIS — Z8679 Personal history of other diseases of the circulatory system: Secondary | ICD-10-CM

## 2017-06-29 DIAGNOSIS — R5383 Other fatigue: Secondary | ICD-10-CM

## 2017-06-29 DIAGNOSIS — F32A Depression, unspecified: Secondary | ICD-10-CM

## 2017-06-29 DIAGNOSIS — E790 Hyperuricemia without signs of inflammatory arthritis and tophaceous disease: Secondary | ICD-10-CM

## 2017-06-29 DIAGNOSIS — E559 Vitamin D deficiency, unspecified: Secondary | ICD-10-CM

## 2017-06-29 DIAGNOSIS — G5701 Lesion of sciatic nerve, right lower limb: Secondary | ICD-10-CM | POA: Diagnosis not present

## 2017-06-29 DIAGNOSIS — M1A09X0 Idiopathic chronic gout, multiple sites, without tophus (tophi): Secondary | ICD-10-CM

## 2017-06-29 DIAGNOSIS — F329 Major depressive disorder, single episode, unspecified: Secondary | ICD-10-CM

## 2017-06-29 DIAGNOSIS — M8589 Other specified disorders of bone density and structure, multiple sites: Secondary | ICD-10-CM | POA: Diagnosis not present

## 2017-06-29 DIAGNOSIS — M19071 Primary osteoarthritis, right ankle and foot: Secondary | ICD-10-CM

## 2017-06-29 DIAGNOSIS — M170 Bilateral primary osteoarthritis of knee: Secondary | ICD-10-CM

## 2017-06-29 DIAGNOSIS — M19042 Primary osteoarthritis, left hand: Secondary | ICD-10-CM

## 2017-06-29 DIAGNOSIS — M5136 Other intervertebral disc degeneration, lumbar region: Secondary | ICD-10-CM

## 2017-06-29 DIAGNOSIS — Z85528 Personal history of other malignant neoplasm of kidney: Secondary | ICD-10-CM

## 2017-06-29 DIAGNOSIS — M19041 Primary osteoarthritis, right hand: Secondary | ICD-10-CM | POA: Diagnosis not present

## 2017-06-29 DIAGNOSIS — M19072 Primary osteoarthritis, left ankle and foot: Secondary | ICD-10-CM

## 2017-06-29 DIAGNOSIS — Z8639 Personal history of other endocrine, nutritional and metabolic disease: Secondary | ICD-10-CM

## 2017-06-29 DIAGNOSIS — F419 Anxiety disorder, unspecified: Secondary | ICD-10-CM

## 2017-06-29 DIAGNOSIS — K760 Fatty (change of) liver, not elsewhere classified: Secondary | ICD-10-CM

## 2017-06-29 MED ORDER — PREDNISONE 5 MG PO TABS: ORAL_TABLET | ORAL | 0 refills | Status: DC

## 2017-06-29 NOTE — Patient Instructions (Signed)
Piriformis Syndrome Rehab Ask your health care provider which exercises are safe for you. Do exercises exactly as told by your health care provider and adjust them as directed. It is normal to feel mild stretching, pulling, tightness, or discomfort as you do these exercises, but you should stop right away if you feel sudden pain or your pain gets worse.Do not begin these exercises until told by your health care provider. Stretching and range of motion exercises These exercises warm up your muscles and joints and improve the movement and flexibility of your hip and pelvis. These exercises also help to relieve pain, numbness, and tingling. Exercise A: Hip rotators  1. Lie on your back on a firm surface. 2. Pull your left / right knee toward your same shoulder with your left / right hand until your knee is pointing toward the ceiling. Hold your left / right ankle with your other hand. 3. Keeping your knee steady, gently pull your left / right ankle toward your other shoulder until you feel a stretch in your buttocks. 4. Hold this position for __________ seconds. Repeat __________ times. Complete this stretch __________ times a day. Exercise B: Hip extensors 1. Lie on your back on a firm surface. Both of your legs should be straight. 2. Pull your left / right knee to your chest. Hold your leg in this position by holding onto the back of your thigh or the front of your knee. 3. Hold this position for __________ seconds. 4. Slowly return to the starting position. Repeat __________ times. Complete this stretch __________ times a day. Strengthening exercises These exercises build strength and endurance in your hip and thigh muscles. Endurance is the ability to use your muscles for a long time, even after they get tired. Exercise C: Straight leg raises ( hip abductors) 1. Lie on your side with your left / right leg in the top position. Lie so your head, shoulder, knee, and hip line up. Bend your bottom  knee to help you balance. 2. Lift your top leg up 4-6 inches (10-15 cm), keeping your toes pointed straight ahead. 3. Hold this position for __________ seconds. 4. Slowly lower your leg to the starting position. Let your muscles relax completely. Repeat __________ times. Complete this exercise__________ times a day. Exercise D: Hip abductors and rotators, quadruped  1. Get on your hands and knees on a firm, lightly padded surface. Your hands should be directly below your shoulders, and your knees should be directly below your hips. 2. Lift your left / right knee out to the side. Keep your knee bent. Do not twist your body. 3. Hold this position for __________ seconds. 4. Slowly lower your leg. Repeat __________ times. Complete this exercise__________ times a day. Exercise E: Straight leg raises ( hip extensors) 1. Lie on your abdomen on a bed or a firm surface with a pillow under your hips. 2. Squeeze your buttock muscles and lift your left / right thigh off the bed. Do not let your back arch. 3. Hold this position for __________ seconds. 4. Slowly return to the starting position. Let your muscles relax completely before doing another repetition. Repeat __________ times. Complete this exercise__________ times a day. This information is not intended to replace advice given to you by your health care provider. Make sure you discuss any questions you have with your health care provider. Document Released: 01/30/2005 Document Revised: 10/05/2015 Document Reviewed: 01/12/2015 Elsevier Interactive Patient Education  2018 [employer]  

## 2017-08-15 DIAGNOSIS — H9113 Presbycusis, bilateral: Secondary | ICD-10-CM | POA: Diagnosis not present

## 2017-08-15 DIAGNOSIS — H938X9 Other specified disorders of ear, unspecified ear: Secondary | ICD-10-CM | POA: Diagnosis not present

## 2017-08-15 DIAGNOSIS — H9203 Otalgia, bilateral: Secondary | ICD-10-CM | POA: Diagnosis not present

## 2017-09-06 ENCOUNTER — Other Ambulatory Visit: Payer: Self-pay | Admitting: Gastroenterology

## 2017-09-06 DIAGNOSIS — K219 Gastro-esophageal reflux disease without esophagitis: Secondary | ICD-10-CM | POA: Diagnosis not present

## 2017-09-06 DIAGNOSIS — E119 Type 2 diabetes mellitus without complications: Secondary | ICD-10-CM | POA: Diagnosis not present

## 2017-09-06 DIAGNOSIS — R1031 Right lower quadrant pain: Secondary | ICD-10-CM

## 2017-09-17 ENCOUNTER — Ambulatory Visit
Admission: RE | Admit: 2017-09-17 | Discharge: 2017-09-17 | Disposition: A | Discharge status: Home / Self Care | Payer: Commercial Managed Care - PPO | Source: Ambulatory Visit | Attending: Gastroenterology | Admitting: Gastroenterology

## 2017-09-17 DIAGNOSIS — R1031 Right lower quadrant pain: Secondary | ICD-10-CM

## 2017-09-17 DIAGNOSIS — C169 Malignant neoplasm of stomach, unspecified: Secondary | ICD-10-CM | POA: Diagnosis not present

## 2017-09-17 MED ORDER — IOPAMIDOL (ISOVUE-300) INJECTION 61%
100.0000 mL | Freq: Once | INTRAVENOUS | Status: AC | PRN
  Administered 2017-09-17: 100 mL via INTRAVENOUS

## 2017-09-20 DIAGNOSIS — K5792 Diverticulitis of intestine, part unspecified, without perforation or abscess without bleeding: Secondary | ICD-10-CM | POA: Diagnosis not present

## 2017-09-20 DIAGNOSIS — Z85528 Personal history of other malignant neoplasm of kidney: Secondary | ICD-10-CM | POA: Diagnosis not present

## 2017-09-20 DIAGNOSIS — R932 Abnormal findings on diagnostic imaging of liver and biliary tract: Secondary | ICD-10-CM | POA: Diagnosis not present

## 2017-09-22 ENCOUNTER — Other Ambulatory Visit: Payer: Self-pay | Admitting: Gastroenterology

## 2017-09-25 ENCOUNTER — Telehealth: Payer: Self-pay

## 2017-09-25 MED ORDER — ALLOPURINOL 300 MG PO TABS: 300.0000 mg | ORAL_TABLET | Freq: Every day | ORAL | 0 refills | Status: DC

## 2017-09-25 NOTE — Telephone Encounter (Signed)
Refill request received via fax.   Last visit: 06/29/2017 Next visit: 01/03/2018 Uric acid: 06/14/2017 4.4  Okay to refill per Dr. Estanislado Pandy.

## 2017-09-26 ENCOUNTER — Other Ambulatory Visit: Payer: Self-pay | Admitting: Gastroenterology

## 2017-09-26 DIAGNOSIS — R932 Abnormal findings on diagnostic imaging of liver and biliary tract: Secondary | ICD-10-CM

## 2017-10-06 ENCOUNTER — Ambulatory Visit
Admission: RE | Admit: 2017-10-06 | Discharge: 2017-10-06 | Disposition: A | Discharge status: Home / Self Care | Payer: Commercial Managed Care - PPO | Source: Ambulatory Visit | Attending: Diagnostic Radiology | Admitting: Diagnostic Radiology

## 2017-10-06 ENCOUNTER — Other Ambulatory Visit: Payer: Self-pay | Admitting: Gastroenterology

## 2017-10-06 ENCOUNTER — Ambulatory Visit
Admission: RE | Admit: 2017-10-06 | Discharge: 2017-10-06 | Disposition: A | Discharge status: Home / Self Care | Payer: Commercial Managed Care - PPO | Source: Ambulatory Visit | Attending: Gastroenterology | Admitting: Gastroenterology

## 2017-10-06 ENCOUNTER — Other Ambulatory Visit: Payer: Self-pay | Admitting: Diagnostic Radiology

## 2017-10-06 DIAGNOSIS — M795 Residual foreign body in soft tissue: Secondary | ICD-10-CM | POA: Diagnosis not present

## 2017-10-06 DIAGNOSIS — R932 Abnormal findings on diagnostic imaging of liver and biliary tract: Secondary | ICD-10-CM

## 2017-10-06 DIAGNOSIS — K760 Fatty (change of) liver, not elsewhere classified: Secondary | ICD-10-CM | POA: Diagnosis not present

## 2017-10-06 MED ORDER — GADOBENATE DIMEGLUMINE 529 MG/ML IV SOLN
20.0000 mL | Freq: Once | INTRAVENOUS | Status: AC | PRN
  Administered 2017-10-06: 20 mL via INTRAVENOUS

## 2017-11-01 DIAGNOSIS — G4733 Obstructive sleep apnea (adult) (pediatric): Secondary | ICD-10-CM | POA: Diagnosis not present

## 2017-11-29 DIAGNOSIS — E119 Type 2 diabetes mellitus without complications: Secondary | ICD-10-CM | POA: Diagnosis not present

## 2017-11-29 DIAGNOSIS — H40023 Open angle with borderline findings, high risk, bilateral: Secondary | ICD-10-CM | POA: Diagnosis not present

## 2017-11-30 DIAGNOSIS — M25572 Pain in left ankle and joints of left foot: Secondary | ICD-10-CM | POA: Diagnosis not present

## 2017-11-30 DIAGNOSIS — Z6841 Body Mass Index (BMI) 40.0 and over, adult: Secondary | ICD-10-CM | POA: Diagnosis not present

## 2017-12-08 DIAGNOSIS — G4733 Obstructive sleep apnea (adult) (pediatric): Secondary | ICD-10-CM | POA: Diagnosis not present
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
## 2017-12-25 ENCOUNTER — Other Ambulatory Visit: Payer: Self-pay | Admitting: Rheumatology

## 2017-12-25 NOTE — Telephone Encounter (Addendum)
Last Visit: 06/29/17 Next Visit: 01/03/18 Labs: 02/22/17 elevated glucose  Patient's husband who is on dpr advised patient is due for labs. He states he will have her update labs 12/27/17.  Okay to refill 30 day supply Allopurinol?

## 2017-12-25 NOTE — Telephone Encounter (Signed)
ok 

## 2018-01-03 ENCOUNTER — Ambulatory Visit: Payer: Commercial Managed Care - PPO | Admitting: Physician Assistant

## 2018-01-03 ENCOUNTER — Telehealth: Payer: Self-pay

## 2018-01-03 ENCOUNTER — Ambulatory Visit (INDEPENDENT_AMBULATORY_CARE_PROVIDER_SITE_OTHER): Payer: Self-pay

## 2018-01-03 ENCOUNTER — Encounter: Payer: Self-pay | Admitting: Physician Assistant

## 2018-01-03 VITALS — BP 144/74 | HR 79 | Resp 14 | Ht 63.0 in | Wt 262.0 lb

## 2018-01-03 DIAGNOSIS — M1A09X0 Idiopathic chronic gout, multiple sites, without tophus (tophi): Secondary | ICD-10-CM | POA: Diagnosis not present

## 2018-01-03 DIAGNOSIS — Z8679 Personal history of other diseases of the circulatory system: Secondary | ICD-10-CM

## 2018-01-03 DIAGNOSIS — F329 Major depressive disorder, single episode, unspecified: Secondary | ICD-10-CM

## 2018-01-03 DIAGNOSIS — M8589 Other specified disorders of bone density and structure, multiple sites: Secondary | ICD-10-CM

## 2018-01-03 DIAGNOSIS — F419 Anxiety disorder, unspecified: Secondary | ICD-10-CM

## 2018-01-03 DIAGNOSIS — M19072 Primary osteoarthritis, left ankle and foot: Secondary | ICD-10-CM

## 2018-01-03 DIAGNOSIS — Z5181 Encounter for therapeutic drug level monitoring: Secondary | ICD-10-CM | POA: Diagnosis not present

## 2018-01-03 DIAGNOSIS — E790 Hyperuricemia without signs of inflammatory arthritis and tophaceous disease: Secondary | ICD-10-CM | POA: Diagnosis not present

## 2018-01-03 DIAGNOSIS — G5701 Lesion of sciatic nerve, right lower limb: Secondary | ICD-10-CM

## 2018-01-03 DIAGNOSIS — Z8639 Personal history of other endocrine, nutritional and metabolic disease: Secondary | ICD-10-CM

## 2018-01-03 DIAGNOSIS — M19071 Primary osteoarthritis, right ankle and foot: Secondary | ICD-10-CM

## 2018-01-03 DIAGNOSIS — K760 Fatty (change of) liver, not elsewhere classified: Secondary | ICD-10-CM

## 2018-01-03 DIAGNOSIS — M51369 Other intervertebral disc degeneration, lumbar region without mention of lumbar back pain or lower extremity pain: Secondary | ICD-10-CM

## 2018-01-03 DIAGNOSIS — M170 Bilateral primary osteoarthritis of knee: Secondary | ICD-10-CM

## 2018-01-03 DIAGNOSIS — G8929 Other chronic pain: Secondary | ICD-10-CM

## 2018-01-03 DIAGNOSIS — M5136 Other intervertebral disc degeneration, lumbar region: Secondary | ICD-10-CM

## 2018-01-03 DIAGNOSIS — M5030 Other cervical disc degeneration, unspecified cervical region: Secondary | ICD-10-CM

## 2018-01-03 DIAGNOSIS — F32A Depression, unspecified: Secondary | ICD-10-CM

## 2018-01-03 DIAGNOSIS — M19041 Primary osteoarthritis, right hand: Secondary | ICD-10-CM

## 2018-01-03 DIAGNOSIS — Z85528 Personal history of other malignant neoplasm of kidney: Secondary | ICD-10-CM

## 2018-01-03 DIAGNOSIS — E559 Vitamin D deficiency, unspecified: Secondary | ICD-10-CM

## 2018-01-03 DIAGNOSIS — M25562 Pain in left knee: Secondary | ICD-10-CM

## 2018-01-03 DIAGNOSIS — M25561 Pain in right knee: Secondary | ICD-10-CM

## 2018-01-03 DIAGNOSIS — M19042 Primary osteoarthritis, left hand: Secondary | ICD-10-CM

## 2018-01-03 MED ORDER — ALLOPURINOL 300 MG PO TABS: 300.0000 mg | ORAL_TABLET | Freq: Every day | ORAL | 0 refills | Status: DC

## 2018-01-03 NOTE — Telephone Encounter (Signed)
Please apply for bilateral knee visco, per Dr. Deveshwar. Thanks!  

## 2018-01-03 NOTE — Telephone Encounter (Signed)
Noted  

## 2018-01-04 LAB — CBC WITH DIFFERENTIAL/PLATELET
BASOS ABS: 74 {cells}/uL (ref 0–200)
Basophils Relative: 0.6 %
EOS PCT: 3.5 %
Eosinophils Absolute: 434 cells/uL (ref 15–500)
HEMATOCRIT: 38.7 % (ref 35.0–45.0)
HEMOGLOBIN: 13.1 g/dL (ref 11.7–15.5)
LYMPHS ABS: 3757 {cells}/uL (ref 850–3900)
MCH: 28.4 pg (ref 27.0–33.0)
MCHC: 33.9 g/dL (ref 32.0–36.0)
MCV: 83.8 fL (ref 80.0–100.0)
MONOS PCT: 6.6 %
MPV: 9.6 fL (ref 7.5–12.5)
NEUTROS ABS: 7316 {cells}/uL (ref 1500–7800)
Neutrophils Relative %: 59 %
Platelets: 351 10*3/uL (ref 140–400)
RBC: 4.62 10*6/uL (ref 3.80–5.10)
RDW: 14.3 % (ref 11.0–15.0)
Total Lymphocyte: 30.3 %
WBC mixed population: 818 cells/uL (ref 200–950)
WBC: 12.4 10*3/uL — ABNORMAL HIGH (ref 3.8–10.8)

## 2018-01-04 LAB — COMPLETE METABOLIC PANEL WITH GFR
AG Ratio: 1.3 (calc) (ref 1.0–2.5)
ALBUMIN MSPROF: 4.3 g/dL (ref 3.6–5.1)
ALT: 24 U/L (ref 6–29)
AST: 19 U/L (ref 10–35)
Alkaline phosphatase (APISO): 82 U/L (ref 33–130)
BUN / CREAT RATIO: 13 (calc) (ref 6–22)
BUN: 13 mg/dL (ref 7–25)
CHLORIDE: 99 mmol/L (ref 98–110)
CO2: 31 mmol/L (ref 20–32)
Calcium: 10.1 mg/dL (ref 8.6–10.4)
Creat: 1.03 mg/dL — ABNORMAL HIGH (ref 0.50–0.99)
GFR, EST AFRICAN AMERICAN: 68 mL/min/{1.73_m2} (ref >=60)
GFR, EST NON AFRICAN AMERICAN: 59 mL/min/{1.73_m2} — AB (ref >=60)
GLUCOSE: 125 mg/dL — AB (ref 65–99)
Globulin: 3.4 g/dL (calc) (ref 1.9–3.7)
Potassium: 3.7 mmol/L (ref 3.5–5.3)
Sodium: 138 mmol/L (ref 135–146)
TOTAL PROTEIN: 7.7 g/dL (ref 6.1–8.1)
Total Bilirubin: 0.4 mg/dL (ref 0.2–1.2)

## 2018-01-04 LAB — URIC ACID: Uric Acid, Serum: 4.6 mg/dL (ref 2.5–7.0)

## 2018-01-04 NOTE — Progress Notes (Signed)
WBC count is mildly elevated. Please ask patient if she has had any recent infection.  Uric acid is within desirable range.  Creatinine is very mildly elevated and GFR is borderline low.  Please advise patient to avoid NSAIDs.  We will continue to monitor.

## 2018-01-08 ENCOUNTER — Telehealth (INDEPENDENT_AMBULATORY_CARE_PROVIDER_SITE_OTHER): Payer: Self-pay

## 2018-01-08 NOTE — Telephone Encounter (Signed)
Submitted VOB for Orthovisc, bilateral knee.  

## 2018-01-15 ENCOUNTER — Other Ambulatory Visit: Payer: Self-pay | Admitting: *Deleted

## 2018-01-15 MED ORDER — DICLOFENAC SODIUM 1 % TD GEL: TRANSDERMAL | 3 refills | Status: DC

## 2018-01-15 NOTE — Telephone Encounter (Signed)
Refill request received via fax  Last Visit: 01/03/18 Next Visit:07/04/18  Okay to refill per Dr. Estanislado Pandy
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
## 2018-01-23 ENCOUNTER — Telehealth (INDEPENDENT_AMBULATORY_CARE_PROVIDER_SITE_OTHER): Payer: Self-pay

## 2018-01-23 NOTE — Telephone Encounter (Signed)
Please schedule patient an appointment with Dr. Estanislado Pandy.  Thank you.  Patient is approved for Orthovisc series, bilateral knee. Covered at 100% through her insurance. May have a co-pay of $40.00 each visit. No PA required

## 2018-01-24 ENCOUNTER — Ambulatory Visit: Payer: Commercial Managed Care - PPO | Admitting: Physician Assistant

## 2018-01-24 ENCOUNTER — Encounter: Payer: Self-pay | Admitting: Physician Assistant

## 2018-01-24 ENCOUNTER — Encounter: Payer: Self-pay | Admitting: Rheumatology

## 2018-01-24 VITALS — BP 145/88 | HR 87 | Resp 16 | Ht 63.0 in | Wt 265.2 lb

## 2018-01-24 DIAGNOSIS — M170 Bilateral primary osteoarthritis of knee: Secondary | ICD-10-CM

## 2018-01-24 DIAGNOSIS — F419 Anxiety disorder, unspecified: Secondary | ICD-10-CM

## 2018-01-24 DIAGNOSIS — F32A Depression, unspecified: Secondary | ICD-10-CM

## 2018-01-24 DIAGNOSIS — M19041 Primary osteoarthritis, right hand: Secondary | ICD-10-CM

## 2018-01-24 DIAGNOSIS — M8589 Other specified disorders of bone density and structure, multiple sites: Secondary | ICD-10-CM

## 2018-01-24 DIAGNOSIS — M5136 Other intervertebral disc degeneration, lumbar region: Secondary | ICD-10-CM

## 2018-01-24 DIAGNOSIS — M19072 Primary osteoarthritis, left ankle and foot: Secondary | ICD-10-CM

## 2018-01-24 DIAGNOSIS — M19071 Primary osteoarthritis, right ankle and foot: Secondary | ICD-10-CM

## 2018-01-24 DIAGNOSIS — M7061 Trochanteric bursitis, right hip: Secondary | ICD-10-CM | POA: Diagnosis not present

## 2018-01-24 DIAGNOSIS — E559 Vitamin D deficiency, unspecified: Secondary | ICD-10-CM

## 2018-01-24 DIAGNOSIS — Z85528 Personal history of other malignant neoplasm of kidney: Secondary | ICD-10-CM | POA: Diagnosis not present

## 2018-01-24 DIAGNOSIS — F329 Major depressive disorder, single episode, unspecified: Secondary | ICD-10-CM

## 2018-01-24 DIAGNOSIS — M1A09X0 Idiopathic chronic gout, multiple sites, without tophus (tophi): Secondary | ICD-10-CM | POA: Diagnosis not present

## 2018-01-24 DIAGNOSIS — Z8639 Personal history of other endocrine, nutritional and metabolic disease: Secondary | ICD-10-CM

## 2018-01-24 DIAGNOSIS — E790 Hyperuricemia without signs of inflammatory arthritis and tophaceous disease: Secondary | ICD-10-CM | POA: Diagnosis not present

## 2018-01-24 DIAGNOSIS — M5030 Other cervical disc degeneration, unspecified cervical region: Secondary | ICD-10-CM

## 2018-01-24 DIAGNOSIS — M19042 Primary osteoarthritis, left hand: Secondary | ICD-10-CM

## 2018-01-24 DIAGNOSIS — Z8679 Personal history of other diseases of the circulatory system: Secondary | ICD-10-CM

## 2018-01-24 DIAGNOSIS — Z90710 Acquired absence of both cervix and uterus: Secondary | ICD-10-CM | POA: Diagnosis not present

## 2018-01-24 DIAGNOSIS — K760 Fatty (change of) liver, not elsewhere classified: Secondary | ICD-10-CM

## 2018-01-24 MED ORDER — LIDOCAINE HCL 1 % IJ SOLN
1.5000 mL | INTRAMUSCULAR | Status: AC | PRN
  Administered 2018-01-24: 1.5 mL

## 2018-01-24 MED ORDER — TRIAMCINOLONE ACETONIDE 40 MG/ML IJ SUSP
40.0000 mg | INTRAMUSCULAR | Status: AC | PRN
  Administered 2018-01-24: 40 mg via INTRA_ARTICULAR

## 2018-01-24 MED ORDER — LIDOCAINE HCL 1 % IJ SOLN
1.0000 mL | INTRAMUSCULAR | Status: AC | PRN
  Administered 2018-01-24: 1 mL

## 2018-01-24 MED ORDER — LIDOCAINE HCL 1 % IJ SOLN
1.5000 mL | INTRAMUSCULAR | Status: AC | PRN
Start: 2018-01-24 — End: 2018-01-24
  Administered 2018-01-24: 1.5 mL

## 2018-01-24 MED ORDER — HYALURONAN 30 MG/2ML IX SOSY
30.0000 mg | PREFILLED_SYRINGE | INTRA_ARTICULAR | Status: AC | PRN
Start: 2018-01-24 — End: 2018-01-24
  Administered 2018-01-24: 30 mg via INTRA_ARTICULAR

## 2018-01-24 MED ORDER — HYALURONAN 30 MG/2ML IX SOSY
30.0000 mg | PREFILLED_SYRINGE | INTRA_ARTICULAR | Status: AC | PRN
  Administered 2018-01-24: 30 mg via INTRA_ARTICULAR

## 2018-01-31 ENCOUNTER — Ambulatory Visit (INDEPENDENT_AMBULATORY_CARE_PROVIDER_SITE_OTHER): Payer: Commercial Managed Care - PPO | Admitting: Physician Assistant

## 2018-01-31 DIAGNOSIS — M170 Bilateral primary osteoarthritis of knee: Secondary | ICD-10-CM | POA: Diagnosis not present

## 2018-01-31 MED ORDER — LIDOCAINE HCL 1 % IJ SOLN
1.5000 mL | INTRAMUSCULAR | Status: AC | PRN
  Administered 2018-01-31: 1.5 mL

## 2018-01-31 MED ORDER — HYALURONAN 30 MG/2ML IX SOSY
30.0000 mg | PREFILLED_SYRINGE | INTRA_ARTICULAR | Status: AC | PRN
  Administered 2018-01-31: 30 mg via INTRA_ARTICULAR

## 2018-01-31 NOTE — Progress Notes (Signed)
   Procedure Note  Patient: [name]             Date of Birth: 09/28/1955           MRN: [id number]             Visit Date: 01/31/2018  Procedures: Visit Diagnoses: Primary osteoarthritis of both knees - Plan: Large Joint Inj: bilateral knee orthovisc #2 bilateral B/B Large Joint Inj: bilateral knee on 01/31/2018 8:09 AM Indications: pain Details: 25 G 1.5 in needle, medial approach  Arthrogram: No  Medications (Right): 30 mg Hyaluronan 30 MG/2ML; 1.5 mL lidocaine 1 % Aspirate (Right): 0 mL Medications (Left): 30 mg Hyaluronan 30 MG/2ML; 1.5 mL lidocaine 1 % Aspirate (Left): 0 mL Outcome: tolerated well, no immediate complications Procedure, treatment alternatives, risks and benefits explained, specific risks discussed. Consent was given by the patient. Immediately prior to procedure a time out was called to verify the correct patient, procedure, equipment, support staff and site/side marked as required. Patient was prepped and draped in the usual sterile fashion.     She requested a referral to physical therapy for right trochanteric bursitis and bilateral knee osteoarthritis.  A hand written prescription was provided to the patient to take with her to PT. Patient tolerated the procedure well.  Hazel Sams, PA-C

## 2018-02-01 ENCOUNTER — Telehealth: Payer: Self-pay

## 2018-02-01 NOTE — Telephone Encounter (Signed)
DEXA scan 01/24/2018  Osteopenia  BMD: 0.639 T-score: -1.9  Recommended calcium, vitamin D and resistive exercises. Repeat bone density in 2 years.   Patient verbalized understanding.

## 2018-02-08 ENCOUNTER — Ambulatory Visit (INDEPENDENT_AMBULATORY_CARE_PROVIDER_SITE_OTHER): Payer: Commercial Managed Care - PPO | Admitting: Physician Assistant

## 2018-02-08 DIAGNOSIS — M170 Bilateral primary osteoarthritis of knee: Secondary | ICD-10-CM | POA: Diagnosis not present

## 2018-02-08 MED ORDER — LIDOCAINE HCL 1 % IJ SOLN
1.5000 mL | INTRAMUSCULAR | Status: AC | PRN
  Administered 2018-02-08: 1.5 mL

## 2018-02-08 MED ORDER — HYALURONAN 30 MG/2ML IX SOSY
30.0000 mg | PREFILLED_SYRINGE | INTRA_ARTICULAR | Status: AC | PRN
  Administered 2018-02-08: 30 mg via INTRA_ARTICULAR

## 2018-02-08 MED ORDER — LIDOCAINE HCL 1 % IJ SOLN
1.5000 mL | INTRAMUSCULAR | Status: AC | PRN
Start: 2018-02-08 — End: 2018-02-08
  Administered 2018-02-08: 1.5 mL

## 2018-02-08 NOTE — Progress Notes (Signed)
   Procedure Note  Patient: [name]             Date of Birth: January 26, 1956           MRN: [id number]             Visit Date: 02/08/2018  Procedures: Visit Diagnoses: Primary osteoarthritis of both knees - Plan: Large Joint Inj: bilateral knee orthovisc #3 bilateral B/B Large Joint Inj: bilateral knee on 02/08/2018 8:59 AM Indications: pain Details: 25 G 1.5 in needle, medial approach  Arthrogram: No  Medications (Right): 30 mg Hyaluronan 30 MG/2ML; 1.5 mL lidocaine 1 % Aspirate (Right): 0 mL Medications (Left): 30 mg Hyaluronan 30 MG/2ML; 1.5 mL lidocaine 1 % Aspirate (Left): 0 mL Outcome: tolerated well, no immediate complications Procedure, treatment alternatives, risks and benefits explained, specific risks discussed. Consent was given by the patient. Immediately prior to procedure a time out was called to verify the correct patient, procedure, equipment, support staff and site/side marked as required. Patient was prepped and draped in the usual sterile fashion.     Patient tolerated the procedure well.  Hazel Sams, PA-C

## 2018-03-26 ENCOUNTER — Telehealth: Payer: Self-pay | Admitting: Rheumatology

## 2018-03-26 NOTE — Telephone Encounter (Signed)
Patient's husband called stating [name] is still having a lot of pain in her right hip and would be interested in the injection that Dr. Estanislado Pandy discussed.  [name] states it is a "deeper injection that she would need a referral for."

## 2018-03-27 NOTE — Telephone Encounter (Signed)
Patient has been scheduled for 03/28/18 at 10:30 am for an appointment and possible injection.

## 2018-03-28 ENCOUNTER — Ambulatory Visit (INDEPENDENT_AMBULATORY_CARE_PROVIDER_SITE_OTHER): Payer: Commercial Managed Care - PPO

## 2018-03-28 ENCOUNTER — Encounter: Payer: Self-pay | Admitting: Rheumatology

## 2018-03-28 ENCOUNTER — Ambulatory Visit: Payer: Commercial Managed Care - PPO | Admitting: Rheumatology

## 2018-03-28 VITALS — BP 128/83 | HR 87 | Resp 15 | Ht 63.0 in | Wt 268.0 lb

## 2018-03-28 DIAGNOSIS — M8589 Other specified disorders of bone density and structure, multiple sites: Secondary | ICD-10-CM

## 2018-03-28 DIAGNOSIS — M19041 Primary osteoarthritis, right hand: Secondary | ICD-10-CM

## 2018-03-28 DIAGNOSIS — M1A09X0 Idiopathic chronic gout, multiple sites, without tophus (tophi): Secondary | ICD-10-CM

## 2018-03-28 DIAGNOSIS — M19042 Primary osteoarthritis, left hand: Secondary | ICD-10-CM

## 2018-03-28 DIAGNOSIS — Z8679 Personal history of other diseases of the circulatory system: Secondary | ICD-10-CM

## 2018-03-28 DIAGNOSIS — M170 Bilateral primary osteoarthritis of knee: Secondary | ICD-10-CM

## 2018-03-28 DIAGNOSIS — M7061 Trochanteric bursitis, right hip: Secondary | ICD-10-CM | POA: Diagnosis not present

## 2018-03-28 DIAGNOSIS — Z85528 Personal history of other malignant neoplasm of kidney: Secondary | ICD-10-CM

## 2018-03-28 DIAGNOSIS — K760 Fatty (change of) liver, not elsewhere classified: Secondary | ICD-10-CM

## 2018-03-28 DIAGNOSIS — E559 Vitamin D deficiency, unspecified: Secondary | ICD-10-CM

## 2018-03-28 DIAGNOSIS — M5030 Other cervical disc degeneration, unspecified cervical region: Secondary | ICD-10-CM

## 2018-03-28 DIAGNOSIS — E790 Hyperuricemia without signs of inflammatory arthritis and tophaceous disease: Secondary | ICD-10-CM | POA: Diagnosis not present

## 2018-03-28 DIAGNOSIS — M19072 Primary osteoarthritis, left ankle and foot: Secondary | ICD-10-CM

## 2018-03-28 DIAGNOSIS — M25551 Pain in right hip: Secondary | ICD-10-CM

## 2018-03-28 DIAGNOSIS — F419 Anxiety disorder, unspecified: Secondary | ICD-10-CM

## 2018-03-28 DIAGNOSIS — F32A Depression, unspecified: Secondary | ICD-10-CM

## 2018-03-28 DIAGNOSIS — Z8639 Personal history of other endocrine, nutritional and metabolic disease: Secondary | ICD-10-CM

## 2018-03-28 DIAGNOSIS — F329 Major depressive disorder, single episode, unspecified: Secondary | ICD-10-CM

## 2018-03-28 DIAGNOSIS — M5136 Other intervertebral disc degeneration, lumbar region: Secondary | ICD-10-CM

## 2018-03-28 DIAGNOSIS — M19071 Primary osteoarthritis, right ankle and foot: Secondary | ICD-10-CM
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
## 2018-03-29 DIAGNOSIS — G4733 Obstructive sleep apnea (adult) (pediatric): Secondary | ICD-10-CM | POA: Diagnosis not present

## 2018-04-12 ENCOUNTER — Encounter (INDEPENDENT_AMBULATORY_CARE_PROVIDER_SITE_OTHER): Payer: Self-pay | Admitting: Physical Medicine and Rehabilitation

## 2018-04-12 ENCOUNTER — Ambulatory Visit (INDEPENDENT_AMBULATORY_CARE_PROVIDER_SITE_OTHER): Payer: Commercial Managed Care - PPO | Admitting: Physical Medicine and Rehabilitation

## 2018-04-12 ENCOUNTER — Ambulatory Visit (INDEPENDENT_AMBULATORY_CARE_PROVIDER_SITE_OTHER): Payer: Self-pay

## 2018-04-12 DIAGNOSIS — M7061 Trochanteric bursitis, right hip: Secondary | ICD-10-CM
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
## 2018-04-12 NOTE — Progress Notes (Signed)
 .  Numeric Pain Rating Scale and Functional Assessment Average Pain 8   In the last MONTH (on 0-10 scale) has pain interfered with the following?  1. General activity like being  able to carry out your everyday physical activities such as walking, climbing stairs, carrying groceries, or moving a chair?  Rating(4)   -Dye Allergies.

## 2018-04-13 MED ORDER — TRIAMCINOLONE ACETONIDE 40 MG/ML IJ SUSP
80.0000 mg | INTRAMUSCULAR | Status: AC | PRN
  Administered 2018-04-12: 80 mg via INTRA_ARTICULAR

## 2018-04-13 MED ORDER — BUPIVACAINE HCL 0.25 % IJ SOLN
4.0000 mL | INTRAMUSCULAR | Status: AC | PRN
  Administered 2018-04-12: 4 mL via INTRA_ARTICULAR

## 2018-04-13 MED ORDER — LIDOCAINE HCL 2 % IJ SOLN
4.0000 mL | INTRAMUSCULAR | Status: AC | PRN
  Administered 2018-04-12: 4 mL

## 2018-04-26 DIAGNOSIS — R638 Other symptoms and signs concerning food and fluid intake: Secondary | ICD-10-CM | POA: Insufficient documentation

## 2018-05-12 ENCOUNTER — Other Ambulatory Visit: Payer: Self-pay | Admitting: Physician Assistant

## 2018-05-13 NOTE — Telephone Encounter (Signed)
Last Visit: 03/28/18 Next Visit: 07/04/18 Labs: 01/04/19 WBC count is mildly elevated. Creatinine is very mildly elevated and GFR is borderline low.  Okay to refill per Dr. Estanislado Pandy

## 2018-05-14 DIAGNOSIS — E119 Type 2 diabetes mellitus without complications: Secondary | ICD-10-CM | POA: Insufficient documentation
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
## 2018-07-01 ENCOUNTER — Other Ambulatory Visit: Payer: Self-pay | Admitting: Rheumatology

## 2018-07-01 NOTE — Telephone Encounter (Signed)
Please schedule patient for a follow up visit. Patient due May 2020 Thanks!  

## 2018-07-01 NOTE — Telephone Encounter (Signed)
Last Visit: 03/28/18 Next visit due May 2020. Message sent to the from to schedule patient  Labs: 01/03/18 Creatinine is very mildly elevated and GFR is borderline low. WBC count is mildly elevated. Uric acid is within desirable range

## 2018-07-02 NOTE — Telephone Encounter (Signed)
Spoke with patient who stated she cancelled her appointment which was scheduled for 07/04/18.  Patient stated she will call back to reschedule.

## 2018-07-04 ENCOUNTER — Ambulatory Visit: Payer: Self-pay | Admitting: Physician Assistant

## 2018-07-31 ENCOUNTER — Other Ambulatory Visit: Payer: Self-pay | Admitting: Rheumatology

## 2018-07-31 NOTE — Telephone Encounter (Signed)
Last Visit: 03/28/2018 Next Visit: message sent to the front desk to schedule.  Labs: 11/21/2019WBC count is mildly elevated. Uric acid is within desirable range. Creatinine is very mildly elevated and GFR is borderline low.We will continue to monitor.   Okay to refill per Dr. Estanislado Pandy.

## 2018-08-08 ENCOUNTER — Other Ambulatory Visit: Payer: Self-pay | Admitting: Rheumatology

## 2018-08-09 ENCOUNTER — Other Ambulatory Visit: Payer: Self-pay | Admitting: Rheumatology

## 2018-09-10 ENCOUNTER — Other Ambulatory Visit: Payer: Self-pay | Admitting: Rheumatology

## 2018-09-18 ENCOUNTER — Telehealth: Payer: Self-pay | Admitting: Rheumatology

## 2018-09-18 NOTE — Telephone Encounter (Signed)
Spoke with patient's husband and advised patient will need updated labs and a follow up visit to refill patient's medication. He states he will talk with patient and call back.

## 2018-09-18 NOTE — Telephone Encounter (Signed)
Patient needs refill on Allopurinol sent to [facility] in [facility].

## 2018-12-02 ENCOUNTER — Other Ambulatory Visit: Payer: Self-pay | Admitting: Gastroenterology

## 2018-12-02 DIAGNOSIS — Z1231 Encounter for screening mammogram for malignant neoplasm of breast: Secondary | ICD-10-CM

## 2019-01-17 ENCOUNTER — Other Ambulatory Visit: Payer: Self-pay

## 2019-01-17 ENCOUNTER — Ambulatory Visit
Admission: RE | Admit: 2019-01-17 | Discharge: 2019-01-17 | Disposition: A | Discharge status: Home / Self Care | Payer: Commercial Managed Care - PPO | Source: Ambulatory Visit | Attending: Gastroenterology | Admitting: Gastroenterology

## 2019-01-17 DIAGNOSIS — Z1231 Encounter for screening mammogram for malignant neoplasm of breast: Secondary | ICD-10-CM

## 2019-02-26 DIAGNOSIS — M7542 Impingement syndrome of left shoulder: Secondary | ICD-10-CM | POA: Insufficient documentation
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
## 2019-06-12 ENCOUNTER — Encounter: Payer: Self-pay | Admitting: Cardiology

## 2019-06-12 ENCOUNTER — Ambulatory Visit: Payer: Commercial Managed Care - PPO | Admitting: Cardiology

## 2019-06-12 ENCOUNTER — Other Ambulatory Visit: Payer: Self-pay

## 2019-06-12 VITALS — BP 122/72 | HR 82 | Temp 96.3°F | Ht 63.0 in | Wt 268.0 lb

## 2019-06-12 DIAGNOSIS — R072 Precordial pain: Secondary | ICD-10-CM

## 2019-06-12 DIAGNOSIS — R0609 Other forms of dyspnea: Secondary | ICD-10-CM

## 2019-06-12 DIAGNOSIS — R0600 Dyspnea, unspecified: Secondary | ICD-10-CM | POA: Diagnosis not present

## 2019-06-12 DIAGNOSIS — R079 Chest pain, unspecified: Secondary | ICD-10-CM | POA: Diagnosis not present

## 2019-06-12 DIAGNOSIS — E782 Mixed hyperlipidemia: Secondary | ICD-10-CM | POA: Diagnosis not present

## 2019-06-12 DIAGNOSIS — E119 Type 2 diabetes mellitus without complications: Secondary | ICD-10-CM

## 2019-06-12 MED ORDER — METOPROLOL TARTRATE 100 MG PO TABS
ORAL_TABLET | ORAL | 0 refills | Status: DC
Start: 2019-06-12 — End: 2020-06-14

## 2019-06-12 NOTE — Patient Instructions (Addendum)
Your cardiac CT will be scheduled at one of the below locations:   [facility] [address] [place], [place] [address] [phone]  [facility] [address] [place], [place] [address] [phone]  If scheduled at [facility], please arrive at the [facility] main entrance of [facility] 30 minutes prior to test start time. Proceed to the [facility] Radiology Department (first floor) to check-in and test prep.  If scheduled at [facility], please arrive 15 mins early for check-in and test prep.  Please follow these instructions carefully (unless otherwise directed):    On the Night Before the Test: . Be sure to Drink plenty of water. . Do not consume any caffeinated/decaffeinated beverages or chocolate 12 hours prior to your test. . Do not take any antihistamines 12 hours prior to your test. . If you take Metformin do not take 24 hours prior to test.  On the Day of the Test: . Drink plenty of water. Do not drink any water within one hour of the test. . Do not eat any food 4 hours prior to the test. . You may take your regular medications prior to the test.  . Take metoprolol 100 mg two hours prior to test. . HOLD Furosemide/Hydrochlorothiazide morning of the test. . FEMALES- please wear underwire-free bra if available          After the Test: . Drink plenty of water. . After receiving IV contrast, you may experience a mild flushed feeling. This is normal. . On occasion, you may experience a mild rash up to 24 hours after the test. This is not dangerous. If this occurs, you can take Benadryl 25 mg and increase your fluid intake. . If you experience trouble breathing, this can be serious. If it is severe call 911 IMMEDIATELY. If it is mild, please call our office. . If you take any of these medications: Glipizide/Metformin, Avandament, Glucavance, please do not  take 48 hours after completing test unless otherwise instructed.   Once we have confirmed authorization from your insurance company, we will call you to set up a date and time for your test.   For non-scheduling related questions, please contact the cardiac imaging nurse navigator should you have any questions/concerns: Marchia Bond, RN Navigator Cardiac Imaging [facility] [phone] office  For scheduling needs, including cancellations and rescheduling, please call [phone].

## 2019-07-10 ENCOUNTER — Telehealth (HOSPITAL_COMMUNITY): Payer: Self-pay | Admitting: *Deleted

## 2019-07-10 NOTE — Telephone Encounter (Signed)
Pt returning call regarding upcoming cardiac imaging study; pt verbalizes understanding of appt date/time, parking situation and where to check in, pre-test NPO status and medications ordered, and verified current allergies; name and call back number provided for further questions should they arise  [name] Tai RN Navigator Cardiac Imaging [facility] [phone] office [phone] cell  

## 2019-07-10 NOTE — Telephone Encounter (Signed)
Attempted to call patient regarding upcoming cardiac CT appointment. Left message on voicemail with name and callback number  Bessie Boyte Tai RN Navigator Cardiac Imaging [facility] [phone] Office [phone] Cell  

## 2019-07-11 ENCOUNTER — Ambulatory Visit (HOSPITAL_COMMUNITY)
Admission: RE | Admit: 2019-07-11 | Discharge: 2019-07-11 | Disposition: A | Discharge status: Home / Self Care | Payer: Commercial Managed Care - PPO | Source: Ambulatory Visit | Attending: Cardiology | Admitting: Cardiology

## 2019-07-11 ENCOUNTER — Other Ambulatory Visit: Payer: Self-pay

## 2019-07-11 DIAGNOSIS — R072 Precordial pain: Secondary | ICD-10-CM | POA: Insufficient documentation

## 2019-07-11 LAB — BASIC METABOLIC PANEL WITH GFR
BUN/Creatinine Ratio: 11 — ABNORMAL LOW (ref 12–28)
BUN: 12 mg/dL (ref 8–27)
CO2: 29 mmol/L (ref 20–29)
Calcium: 10.1 mg/dL (ref 8.7–10.3)
Chloride: 94 mmol/L — ABNORMAL LOW (ref 96–106)
Creatinine, Ser: 1.05 mg/dL — ABNORMAL HIGH (ref 0.57–1.00)
GFR calc Af Amer: 65 mL/min/1.73 (ref >59)
GFR calc non Af Amer: 57 mL/min/1.73 — ABNORMAL LOW (ref >59)
Glucose: 121 mg/dL — ABNORMAL HIGH (ref 65–99)
Potassium: 3.6 mmol/L (ref 3.5–5.2)
Sodium: 140 mmol/L (ref 134–144)

## 2019-07-11 MED ORDER — IOHEXOL 350 MG/ML SOLN
100.0000 mL | Freq: Once | INTRAVENOUS | Status: AC | PRN
  Administered 2019-07-11: 100 mL via INTRAVENOUS

## 2019-07-11 MED ORDER — NITROGLYCERIN 0.4 MG SL SUBL: 0.8000 mg | SUBLINGUAL_TABLET | Freq: Once | SUBLINGUAL | Status: AC

## 2019-07-11 MED ORDER — NITROGLYCERIN 0.4 MG SL SUBL
SUBLINGUAL_TABLET | SUBLINGUAL | Status: AC
  Administered 2019-07-11: 0.8 mg via SUBLINGUAL
  Filled 2019-07-11: qty 2

## 2019-07-11 MED ORDER — METOPROLOL TARTRATE 5 MG/5ML IV SOLN
INTRAVENOUS | Status: AC
  Administered 2019-07-11: 5 mg
  Filled 2019-07-11: qty 5

## 2019-12-26 ENCOUNTER — Other Ambulatory Visit: Payer: Self-pay | Admitting: Nurse Practitioner

## 2019-12-26 DIAGNOSIS — Z1231 Encounter for screening mammogram for malignant neoplasm of breast: Secondary | ICD-10-CM

## 2020-02-12 ENCOUNTER — Ambulatory Visit
Admission: RE | Admit: 2020-02-12 | Discharge: 2020-02-12 | Disposition: A | Discharge status: Home / Self Care | Payer: Commercial Managed Care - PPO | Source: Ambulatory Visit | Attending: Nurse Practitioner | Admitting: Nurse Practitioner

## 2020-02-12 ENCOUNTER — Other Ambulatory Visit: Payer: Self-pay

## 2020-02-12 DIAGNOSIS — Z1231 Encounter for screening mammogram for malignant neoplasm of breast: Secondary | ICD-10-CM

## 2020-04-22 ENCOUNTER — Other Ambulatory Visit: Payer: Self-pay | Admitting: Gastroenterology

## 2020-05-10 ENCOUNTER — Telehealth: Payer: Self-pay | Admitting: Specialist

## 2020-05-10 NOTE — Telephone Encounter (Signed)
I will have to wait for records on this patient for Dr. Louanne Skye to review.  However they should probably ask the provider at [facility] what they can do until she can have the surgery. She will be considered a new patient in our office and she will need to be scheduled as a "NEW pt"

## 2020-05-10 NOTE — Telephone Encounter (Signed)
Pt husband called and states his wife was seen at [facility] and the doctor said she could not do surgery because of her weight. The doctor said she has a herniated disc #7.He is intrested in trying to find another way to help relieve the pain until she can lose the weight to have surgery. He is going to hand her records sent over here for [facility] to see what he is talking about. He would like a call back [phone]

## 2020-05-11 ENCOUNTER — Other Ambulatory Visit: Payer: Self-pay | Admitting: Gastroenterology

## 2020-05-11 ENCOUNTER — Ambulatory Visit
Admission: RE | Admit: 2020-05-11 | Discharge: 2020-05-11 | Disposition: A | Discharge status: Home / Self Care | Payer: Commercial Managed Care - PPO | Source: Ambulatory Visit | Attending: Gastroenterology | Admitting: Gastroenterology

## 2020-05-11 DIAGNOSIS — K5792 Diverticulitis of intestine, part unspecified, without perforation or abscess without bleeding: Secondary | ICD-10-CM

## 2020-05-11 MED ORDER — IOPAMIDOL (ISOVUE-300) INJECTION 61%
100.0000 mL | Freq: Once | INTRAVENOUS | Status: AC | PRN
  Administered 2020-05-11: 100 mL via INTRAVENOUS

## 2020-05-18 NOTE — Telephone Encounter (Signed)
We will schedule once we have records and they have been reviewed by Dr. Louanne Skye

## 2020-06-18 ENCOUNTER — Other Ambulatory Visit (HOSPITAL_COMMUNITY)
Admission: RE | Admit: 2020-06-18 | Discharge: 2020-06-18 | Disposition: A | Discharge status: Home / Self Care | Payer: Commercial Managed Care - PPO | Source: Ambulatory Visit | Attending: Gastroenterology | Admitting: Gastroenterology

## 2020-06-18 DIAGNOSIS — Z01812 Encounter for preprocedural laboratory examination: Secondary | ICD-10-CM | POA: Insufficient documentation

## 2020-06-18 DIAGNOSIS — Z20822 Contact with and (suspected) exposure to covid-19: Secondary | ICD-10-CM | POA: Insufficient documentation

## 2020-06-18 LAB — SARS CORONAVIRUS 2 (TAT 6-24 HRS): SARS Coronavirus 2: NEGATIVE

## 2020-06-18 NOTE — Progress Notes (Signed)
Attempted to obtain medical history via telephone, unable to reach at this time. I left a voicemail to return pre surgical testing department's phone call.  

## 2020-06-21 NOTE — Anesthesia Preprocedure Evaluation (Addendum)
Anesthesia Evaluation  Patient identified by MRN, date of birth, ID band Patient awake    Reviewed: Allergy & Precautions, NPO status , Patient's Chart, lab work & pertinent test results  Airway Mallampati: III  TM Distance: >3 FB Neck ROM: Full    Dental no notable dental hx. (+) Teeth Intact, Dental Advisory Given   Pulmonary neg pulmonary ROS,    Pulmonary exam normal breath sounds clear to auscultation       Cardiovascular hypertension, Pt. on medications Normal cardiovascular exam Rhythm:Regular Rate:Normal     Neuro/Psych PSYCHIATRIC DISORDERS Depression negative neurological ROS     GI/Hepatic GERD  ,(+) Hepatitis -  Endo/Other  diabetes, Type 2, Oral Hypoglycemic Agents  Renal/GU      Musculoskeletal  (+) Arthritis ,   Abdominal (+) + obese,   Peds  Hematology  (+) anemia ,   Anesthesia Other Findings ALL: See list  Reproductive/Obstetrics                            Anesthesia Physical Anesthesia Plan  ASA: III  Anesthesia Plan: MAC   Post-op Pain Management:    Induction:   PONV Risk Score and Plan: Treatment may vary due to age or medical condition  Airway Management Planned: Natural Airway and Nasal Cannula  Additional Equipment: None  Intra-op Plan:   Post-operative Plan:   Informed Consent: I have reviewed the patients History and Physical, chart, labs and discussed the procedure including the risks, benefits and alternatives for the proposed anesthesia with the patient or authorized representative who has indicated his/her understanding and acceptance.     Dental advisory given  Plan Discussed with: CRNA  Anesthesia Plan Comments: (Hx of tubular adenomas)       Anesthesia Quick Evaluation

## 2020-06-21 NOTE — H&P (Signed)
History of Present Illness  General:  65 year old female,  last saw Dr. Oletta Lamas in 09/2017 colonoscopy from 04/2017 for tubular adenomas were removed, repeat recommended in 3 years. Colonoscopy in 2014, sessile serrated adenoma removed, repeat recommended in 5 years Colonoscopy from 2008 normal, repeat recommended in 10 years CT enterography from 2014 showed no explanation for anemia/G.I. bleeding, status post partial left nephrectomy. CT abdomen and pelvis from 09/2017 showed mild acute diverticulitis the sigmoid. BMP from 4/21 showed GFR 57, creatinine 0.98, CBC from 1/21 showed hemoglobin 13.1, platelet 392, LFTs were normal at that point. She had gastric carcinoid 1.6 centimeter, surgically removed in 2014. She reports loose Bms,for the past 1 year, every time she eats, about 3-4 a day, stools are watery, rarely has nocturnal diarrhea. She has had fecal accidents, she has fecal urgency, she was told she has internal hemorrhoids, and has not small amount of blood on wiping, once in 2 weeks. She has pain in her LUQ when she gets constipated. She has constipation once in a few months, stools are hard but she has daily Bms. She takes metamucil as needed for constipation once a day until it clears up. She doesn't take anti diarrheals otherwise. She tries to avoid greasy food to avoid diarrhea. She has bloating, denies sensitivity with gluten. She is requesting an EGD for LUQ pain. She has a lot of coughing and was told she has acid reflux and takes pantoprazole for several years without much relief, she takes cough syrup to help with cough. Denies difficulty or pain on swallowing. She has gained weight, about 5-6 lbs over a period of 6 months.   Current Medications  Taking   Allopurinol 300 MG Tablet TAKE 1 TABLET BY MOUTH EVERY DAY 30   hydroCHLOROthiazide 25 MG Tablet TAKE 1 TABLET BY MOUTH EVERY DAY IN THE MORNING , Notes: Needs OV  Klor-Con M20(Potassium Chloride) 20 MEQ Tablet Extended  Release TAKE 1 TABLET BY MOUTH EVERY DAY   Pantoprazole Sodium 40 MG Tablet Delayed Release TAKE 1 TABLET BY MOUTH EVERY DAY   Rosuvastatin Calcium 10 MG Tablet 1/2 tablet Orally Once a day  Sertraline HCl 100 MG Tablet TAKE 1 AND 1/2 TABLET ONCE A DAY ORALLY 90 DAYS 90   Silvadene(Silver sulfADIAZINE) 1 % Cream 1 application Externally Once a day  Xanax(ALPRAZolam) 0.5 MG Tablet 1/2 tablet Orally every 12 hours if needed for anxiety  metFORMIN HCl 500 MG Tablet 2 tablets with meals Orally Twice a day  Losartan Potassium 100 MG Tablet 1/2 tablet Orally Once a day  Furosemide 20 MG Tablet 1 tablet Orally 5 days a week( PT IS DUE FOR OV)  Cyclobenzaprine HCl 10MG  Tablet Tablet 1/2-1 tablet Orally bedtime as needed for joint pain  Diclofenac Sodium 1 % Gel Transdermal as needed, Notes: as needed  Loratadine 10 MG Tablet 1 tablet Orally Once a day  Vitamin D3 25 MCG (1000 UT) Tablet 1 tablet Orally Once a day  CPAP   Not-Taking   Ezetimibe 10 MG Tablet 1 tablet Orally Once a day  Potassium Chloride ER 8 MEQ Capsule Extended Release 2 capsules with food Orally once a day  Medication List reviewed and reconciled with the patient   Past Medical History  Hypertension - normal exercise stress test 2004.   Edema.   Renal Cell Carcinoma - s/p partial nephrectomy, renal cell carcinoma.   ? hx of JRA 65 yrs old-lasted for a few months-notes was on shots, encouraged exercise; probable gout.  Elevated LFTs, GGT - hepatitis panel negative 2005--fatty liver vs ?autoimmune hepatitis-asma low pos.   Hyperlipidemia.   DJD in cervical spine age 26.   gout vs seronegative RA.   Depression.   Anxiety.   Headache.   Stomach tumor 2014, gastric carcinoid .   Anemia/transfusion- Dr. Florene Glen at [facility].   Autoimmune hepatitis SMAB 60.   Hemochromatosiis carrier heterozygous H63D.   Diabetes.   Dupuytren's Contracture-L palm.   Endoscopy Polyps biopsied.   Endoscopy to remove three  hypertrophic polyps ( Jan 2018).   Obstructive sleep apnea (SPLIT 08/04/16 ESS 10, AHI 89/hr, RDI 93/hr, no REM, O2 min 81%; CPAP 10 with AHI 18).    Surgical History  Tonsillectomy/Addenoidectomy 1968  Wisdom Teeth removed 1979  Removal of Morton's Neuroma-left foot 1982  Breast Biopsy 86/94/96/99  D&C and tubes tied 1992  Hysterectomy and bilateral oophorectomy (fibroids) 06/1996  Laparoscopic Cholecystectomy 06/2000  Partial Nephrectomy, renal cell carcinoma 02/07/2005  Epidermal Cyst removal-right breast 12/31/2006  middle finger rt hand 11/17/2009  partial gastrectomy due to gastric carcinoid 04/2012  colon-09/21/06, 04/25/2017   Incisional Hernia repair w/ mesh- Dr. Ninfa Linden 01/03/13  left hand cyst removal, Dupuytron's Contracture 09/18/2013  lumpectomy, (L) hand-palm, Dupuytren's contracture 09/2013  Radio Frequency Ablation in neck 04/2014  EGD-01/2016, 04/25/17    Family History  Father: deceased  Mother: alive 59 yrs, diverticulitis, diagnosed with Hypertension  Paternal Marquez Mother: ?arthritis  Maternal Grand Mother: deceased, Colon cancer, diagnosed with Colon cancer  1 son(s) .   no autoimmune disease\nNo family hx of liver disease.   Social History  General:  Tobacco use  cigarettes: Never smoked Tobacco history last updated 04/22/2020 Vaping No no EXPOSURE TO PASSIVE SMOKE.  Alcohol: yes, occasionally, beer.  Caffeine: yes, 2+ servings daily, soda, tea.  no Recreational drug use.  DIET: no particular dietary program.  Exercise: walks, nothing structured.  Marital Status: married.  Children: Boys, 1.  OCCUPATION: employed, Chartered certified accountant.  Home smoke detector use: yes.    Allergies  Aspirin: nausea - Side Effects  Penicillin (for allergy): yeast infection - Side Effects  Morphine Sulfate: vomiting - Side Effects  Lisinopril: cough - Side Effects  Prozac: rash - Allergy  Methotrexate: elevated lfts - Allergy  Aloe: rash, blisters - Side Effects   Lactose: diarrhea - Side Effects  Latex Gloves: rash, blisters - Side Effects  Azathioprine: Blood Count - Side Effects  Lyrica: chest tightness - Side Effects  Dilaudid: LFT's - Side Effects  Neomycin Sulfate: local skin reaction - Allergy  Humira: Side Effects   Hospitalization/Major Diagnostic Procedure  not in past yr 03/2012  stomach surgery 04/2012  not in the past yr 10/2016  Not in the past year 09/2017  Diverticulitis x3 2021  not in the past year 04/22/2020   Review of Systems  GI PROCEDURE:  Pacemaker/ AICD no. Artificial heart valves no. MI/heart attack no. Abnormal heart rhythm no. Angina no. CVA no. Hypertension YES. Hypotension no. Asthma, COPD no. Sleep apnea YES, using CPAP. Seizure disorders no. Artificial joints no. Severe DJD no. Diabetes YES, type II. Significant headaches no. Vertigo no. Depression/anxiety YES. Abnormal bleeding no. Kidney Disease YES. Liver disease no. Chance of pregnancy no. Blood transfusion YES, multiple .      Vital Signs  Wt 274.6, Wt change 5.6 lb, Ht 62, BMI 50.22, Temp 98.0, Pulse sitting 76, BP sitting 154/91, Oxygen sat % 96.   Examination  Gastroenterology:: GENERAL APPEARANCE: Well developed, morbidly obese, no active distress, pleasant.  SCLERA: anicteric.  CARDIOVASCULAR Normal RRR .  RESPIRATORY Breath sounds normal. Respiration even and unlabored.  ABDOMEN No masses palpated. Liver and spleen not palpated, normal. Bowel sounds normal, Abdomen not distended, mild LUQ tenderness.  EXTREMITIES: No edema.  NEURO: alert, oriented to time, place and person, normal gait.  PSYCH: mood/affect normal.     Assessments     1. History of adenomatous polyp of colon - Z86.010 (Primary)   2. Chronic diarrhea - K52.9   3. History of diverticulitis - Z87.19   4. Morbidly obese - E66.01   5. LUQ abdominal pain - R10.12   Treatment  1. History of adenomatous polyp of colon  IMAGING: Colonoscopy    Powell,Amy 04/22/2020 11:24:22  AM > Pt scheduled for EGD/COLON [facility] 5/10 - gave instructions, rx and consents   Notes: Due to her BMI, recommend EGD and colonoscopy at [facility] as an outpatient. The risks and benefits of the procedure were discussed with the patient in details. She understands and verbalizes consent. She will be given written instructions, prescription for preparation and will be scheduled for the same.    2. Chronic diarrhea  IMAGING: Colonoscopy    Powell,Amy 04/22/2020 11:24:22 AM > Pt scheduled for EGD/COL[facility] 5/10 - gave instructions, rx and consents  IMAGING: Esophagoscopy    Powell,Amy 04/22/2020 11:24:22 AM > Pt scheduled for EGD/COLON [facility] 5/10 - gave instructions, rx and consents   Notes: Will obtain biopsies from small bowel to evaluate for celiac disease. Will obtain random colon biopsies for evaluation for microscopic colitis.    3. History of diverticulitis  Notes: Advised patient to take high-fiber diet, increase intake of fruits, vegetables, whole grains and to drink at least 60-80 oz of water a day.    4. Morbidly obese  Notes: As her BMI is more than 50, she will need for the procedures to be scheduled as an outpatient at [facility].    5. LUQ abdominal pain  IMAGING: Esophagoscopy    Powell,Amy 04/22/2020 11:24:22 AM > Pt scheduled for EGD/COL[facility] 5/10 - gave instructions, rx and consents   Notes: Will obtain biopsies from antrum/gastric body for evaluation for H. pylori and from small bowel to evaluate for celiac disease. Patient has history of gastric carcinoid which was removed surgically in 2014.

## 2020-06-22 ENCOUNTER — Ambulatory Visit (HOSPITAL_COMMUNITY)
Admission: RE | Admit: 2020-06-22 | Discharge: 2020-06-22 | Disposition: A | Discharge status: Home / Self Care | Payer: Commercial Managed Care - PPO | Attending: Gastroenterology | Admitting: Gastroenterology

## 2020-06-22 ENCOUNTER — Other Ambulatory Visit: Payer: Self-pay

## 2020-06-22 ENCOUNTER — Ambulatory Visit (HOSPITAL_COMMUNITY): Payer: Commercial Managed Care - PPO | Admitting: Anesthesiology

## 2020-06-22 ENCOUNTER — Encounter (HOSPITAL_COMMUNITY): Payer: Self-pay | Admitting: Gastroenterology

## 2020-06-22 ENCOUNTER — Encounter (HOSPITAL_COMMUNITY)
Admission: RE | Disposition: A | Discharge status: Home / Self Care | Payer: Self-pay | Source: Home / Self Care | Attending: Gastroenterology

## 2020-06-22 DIAGNOSIS — D120 Benign neoplasm of cecum: Secondary | ICD-10-CM | POA: Insufficient documentation

## 2020-06-22 DIAGNOSIS — D124 Benign neoplasm of descending colon: Secondary | ICD-10-CM | POA: Insufficient documentation

## 2020-06-22 DIAGNOSIS — Z8719 Personal history of other diseases of the digestive system: Secondary | ICD-10-CM | POA: Diagnosis not present

## 2020-06-22 DIAGNOSIS — K648 Other hemorrhoids: Secondary | ICD-10-CM | POA: Diagnosis not present

## 2020-06-22 DIAGNOSIS — Z90710 Acquired absence of both cervix and uterus: Secondary | ICD-10-CM | POA: Insufficient documentation

## 2020-06-22 DIAGNOSIS — Z7984 Long term (current) use of oral hypoglycemic drugs: Secondary | ICD-10-CM | POA: Diagnosis not present

## 2020-06-22 DIAGNOSIS — E785 Hyperlipidemia, unspecified: Secondary | ICD-10-CM | POA: Diagnosis not present

## 2020-06-22 DIAGNOSIS — Z888 Allergy status to other drugs, medicaments and biological substances status: Secondary | ICD-10-CM | POA: Insufficient documentation

## 2020-06-22 DIAGNOSIS — I10 Essential (primary) hypertension: Secondary | ICD-10-CM | POA: Insufficient documentation

## 2020-06-22 DIAGNOSIS — Z79899 Other long term (current) drug therapy: Secondary | ICD-10-CM | POA: Diagnosis not present

## 2020-06-22 DIAGNOSIS — Z880 Allergy status to penicillin: Secondary | ICD-10-CM | POA: Diagnosis not present

## 2020-06-22 DIAGNOSIS — Z905 Acquired absence of kidney: Secondary | ICD-10-CM | POA: Diagnosis not present

## 2020-06-22 DIAGNOSIS — Z885 Allergy status to narcotic agent status: Secondary | ICD-10-CM | POA: Insufficient documentation

## 2020-06-22 DIAGNOSIS — Z85528 Personal history of other malignant neoplasm of kidney: Secondary | ICD-10-CM | POA: Diagnosis not present

## 2020-06-22 DIAGNOSIS — K5730 Diverticulosis of large intestine without perforation or abscess without bleeding: Secondary | ICD-10-CM | POA: Diagnosis not present

## 2020-06-22 DIAGNOSIS — Z9049 Acquired absence of other specified parts of digestive tract: Secondary | ICD-10-CM | POA: Insufficient documentation

## 2020-06-22 DIAGNOSIS — D122 Benign neoplasm of ascending colon: Secondary | ICD-10-CM | POA: Insufficient documentation

## 2020-06-22 DIAGNOSIS — R197 Diarrhea, unspecified: Secondary | ICD-10-CM | POA: Diagnosis present

## 2020-06-22 DIAGNOSIS — Z8249 Family history of ischemic heart disease and other diseases of the circulatory system: Secondary | ICD-10-CM | POA: Insufficient documentation

## 2020-06-22 DIAGNOSIS — K317 Polyp of stomach and duodenum: Secondary | ICD-10-CM | POA: Insufficient documentation

## 2020-06-22 DIAGNOSIS — Z886 Allergy status to analgesic agent status: Secondary | ICD-10-CM | POA: Insufficient documentation

## 2020-06-22 DIAGNOSIS — E119 Type 2 diabetes mellitus without complications: Secondary | ICD-10-CM | POA: Insufficient documentation

## 2020-06-22 DIAGNOSIS — Z90722 Acquired absence of ovaries, bilateral: Secondary | ICD-10-CM | POA: Diagnosis not present

## 2020-06-22 DIAGNOSIS — Z800 Family history of malignant neoplasm of digestive organs: Secondary | ICD-10-CM | POA: Insufficient documentation

## 2020-06-22 DIAGNOSIS — Z6841 Body Mass Index (BMI) 40.0 and over, adult: Secondary | ICD-10-CM | POA: Diagnosis not present

## 2020-06-22 DIAGNOSIS — Z903 Acquired absence of stomach [part of]: Secondary | ICD-10-CM | POA: Diagnosis not present

## 2020-06-22 DIAGNOSIS — Z8379 Family history of other diseases of the digestive system: Secondary | ICD-10-CM | POA: Insufficient documentation

## 2020-06-22 HISTORY — PX: BIOPSY: SHX5522

## 2020-06-22 HISTORY — PX: SUBMUCOSAL TATTOO INJECTION: SHX6856

## 2020-06-22 HISTORY — PX: COLONOSCOPY WITH PROPOFOL: SHX5780

## 2020-06-22 HISTORY — PX: POLYPECTOMY: SHX5525

## 2020-06-22 HISTORY — PX: SCLEROTHERAPY: SHX6841

## 2020-06-22 HISTORY — PX: ESOPHAGOGASTRODUODENOSCOPY (EGD) WITH PROPOFOL: SHX5813

## 2020-06-22 LAB — GLUCOSE, CAPILLARY: Glucose-Capillary: 164 mg/dL — ABNORMAL HIGH (ref 70–99)

## 2020-06-22 SURGERY — COLONOSCOPY WITH PROPOFOL
Anesthesia: Monitor Anesthesia Care

## 2020-06-22 MED ORDER — SPOT INK MARKER SYRINGE KIT
PACK | SUBMUCOSAL | Status: DC | PRN
  Administered 2020-06-22: 5 mL via SUBMUCOSAL

## 2020-06-22 MED ORDER — SPOT INK MARKER SYRINGE KIT
PACK | SUBMUCOSAL | Status: AC
  Filled 2020-06-22: qty 5

## 2020-06-22 MED ORDER — ONDANSETRON HCL 4 MG/2ML IJ SOLN
INTRAMUSCULAR | Status: AC
  Filled 2020-06-22: qty 2

## 2020-06-22 MED ORDER — PROPOFOL 500 MG/50ML IV EMUL
INTRAVENOUS | Status: DC | PRN
  Administered 2020-06-22: 150 ug/kg/min via INTRAVENOUS

## 2020-06-22 MED ORDER — SODIUM CHLORIDE (PF) 0.9 % IJ SOLN
PREFILLED_SYRINGE | INTRAMUSCULAR | Status: DC | PRN
  Administered 2020-06-22: 3 mL

## 2020-06-22 MED ORDER — KETOROLAC TROMETHAMINE 15 MG/ML IJ SOLN
30.0000 mg | Freq: Once | INTRAMUSCULAR | Status: AC
  Administered 2020-06-22: 30 mg via INTRAVENOUS

## 2020-06-22 MED ORDER — HYDROMORPHONE HCL 1 MG/ML IJ SOLN
INTRAMUSCULAR | Status: AC
  Filled 2020-06-22: qty 1

## 2020-06-22 MED ORDER — HYDROMORPHONE HCL 1 MG/ML IJ SOLN
1.0000 mg | INTRAMUSCULAR | Status: AC | PRN
  Administered 2020-06-22: 1 mg via INTRAVENOUS

## 2020-06-22 MED ORDER — ONDANSETRON HCL 4 MG/2ML IJ SOLN
4.0000 mg | Freq: Once | INTRAMUSCULAR | Status: AC
  Administered 2020-06-22: 4 mg via INTRAVENOUS

## 2020-06-22 MED ORDER — EPINEPHRINE 1 MG/10ML IJ SOSY
PREFILLED_SYRINGE | INTRAMUSCULAR | Status: AC
  Filled 2020-06-22: qty 10

## 2020-06-22 MED ORDER — LIDOCAINE 2% (20 MG/ML) 5 ML SYRINGE
INTRAMUSCULAR | Status: DC | PRN
  Administered 2020-06-22: 40 mg via INTRAVENOUS

## 2020-06-22 MED ORDER — SODIUM CHLORIDE 0.9 % IV SOLN: INTRAVENOUS | Status: DC

## 2020-06-22 SURGICAL SUPPLY — 25 items

## 2020-06-22 NOTE — Op Note (Signed)
[facility] Patient Name: [name] Procedure Date: 06/22/2020 MRN: [id number] Attending MD: Ronnette Juniper , MD Date of Birth: Jun 19, 1955 CSN: [id number] Age: 65 Admit Type: Outpatient Procedure:                Upper GI endoscopy Indications:              Abdominal pain in the left upper quadrant,                            Diarrhea, history of surgery for gastric carcinoid Providers:                Ronnette Juniper, MD, Particia Nearing, RN, Janee Morn,                            Technician Referring MD:             [facility] Medicines:                Monitored Anesthesia Care Complications:            No immediate complications. Estimated blood loss:                            Minimal. Estimated Blood Loss:     Estimated blood loss was minimal. Procedure:                Pre-Anesthesia Assessment:                           - Prior to the procedure, a History and Physical                            was performed, and patient medications and                            allergies were reviewed. The patient's tolerance of                            previous anesthesia was also reviewed. The risks                            and benefits of the procedure and the sedation                            options and risks were discussed with the patient.                            All questions were answered, and informed consent                            was obtained. Prior Anticoagulants: The patient has                            taken no previous anticoagulant or antiplatelet  agents. ASA Grade Assessment: III - A patient with                            severe systemic disease. After reviewing the risks                            and benefits, the patient was deemed in                            satisfactory condition to undergo the procedure.                           After obtaining informed consent, the endoscope was                             passed under direct vision. Throughout the                            procedure, the patient's blood pressure, pulse, and                            oxygen saturations were monitored continuously. The                            [id number]) Olympus gastroscope was                            introduced through the mouth, and advanced to the                            second part of duodenum. The upper GI endoscopy was                            accomplished without difficulty. The patient                            tolerated the procedure well. Scope In: Scope Out: Findings:      The upper third of the esophagus, middle third of the esophagus, lower       third of the esophagus and gastroesophageal junction were normal.      A single large semi-sessile polyp with no bleeding and no stigmata of       recent bleeding was found on the proximal greater curvature of the       gastric body. The polyp was removed with a piecemeal technique using a       hot snare at 20 watts. Polyp resection was incomplete. The resected       tissue was retrieved using a Roth net. Two hemostatic clips were       successfully placed (MR conditional). Area was successfully injected       with 3 mL of a 1:10,000 solution of epinephrine for hemostasis. Area was       tattooed with an injection of Spot (carbon black).      Localized moderate mucosal changes characterized by       indentation,scarring, polypoid appearance  noted were found at the       incisura and in the gastric antrum. This made to be related to prior       surgery. Biopsies were taken with a cold forceps for histology.      Diffuse moderately erythematous mucosa without bleeding was found in the       gastric body and in the gastric antrum. Biopsies were taken with a cold       forceps for Helicobacter pylori testing.      The cardia and gastric fundus were otherwise normal on retroflexion.      The examined duodenum was normal.  Biopsies for histology were taken with       a cold forceps for evaluation of celiac disease. Impression:               - Normal upper third of esophagus, middle third of                            esophagus, lower third of esophagus and                            gastroesophageal junction.                           - A single gastric polyp. Incomplete resection.                            Resected tissue retrieved. Clips (MR conditional)                            were placed. Injected. Tattooed.                           - Indentation and scarring noted mucosa in the                            incisura and antrum. Biopsied.                           - Erythematous mucosa in the gastric body and                            antrum. Biopsied.                           - Normal examined duodenum. Biopsied. Moderate Sedation:      Patient did not receive moderate sedation for this procedure, but       instead received monitored anesthesia care. Recommendation:           - Patient has a contact number available for                            emergencies. The signs and symptoms of potential                            delayed complications were discussed with the  patient. Return to normal activities tomorrow.                            Written discharge instructions were provided to the                            patient.                           - Resume regular diet.                           - Continue present medications.                           - Await pathology results. Procedure Code(s):        --- Professional ---                           [phone], 59, Esophagogastroduodenoscopy, flexible,                            transoral; with control of bleeding, any method                           43251, Esophagogastroduodenoscopy, flexible,                            transoral; with removal of tumor(s), polyp(s), or                            other lesion(s) by  snare technique                           43236, 59, Esophagogastroduodenoscopy, flexible,                            transoral; with directed submucosal injection(s),                            any substance                           43239, 59, Esophagogastroduodenoscopy, flexible,                            transoral; with biopsy, single or multiple Diagnosis Code(s):        --- Professional ---                           K31.7, Polyp of stomach and duodenum                           K31.89, Other diseases of stomach and duodenum                           R10.12, Left upper quadrant pain  R19.7, Diarrhea, unspecified CPT copyright 2019 [employer]. All rights reserved. The codes documented in this report are preliminary and upon coder review may  be revised to meet current compliance requirements. Ronnette Juniper, MD 06/22/2020 9:54:47 AM This report has been signed electronically. Number of Addenda: 0

## 2020-06-22 NOTE — Discharge Instructions (Signed)
YOU HAD AN ENDOSCOPIC PROCEDURE TODAY: Refer to the procedure report and other information in the discharge instructions given to you for any specific questions about what was found during the examination. If this information does not answer your questions, please call [facility] office at [phone] to clarify.   YOU SHOULD EXPECT: Some feelings of bloating in the abdomen. Passage of more gas than usual. Walking can help get rid of the air that was put into your GI tract during the procedure and reduce the bloating. If you had a lower endoscopy (such as a colonoscopy or flexible sigmoidoscopy) you may notice spotting of blood in your stool or on the toilet paper. Some abdominal soreness may be present for a day or two, also.  DIET: Your first meal following the procedure should be a light meal and then it is ok to progress to your normal diet. A half-sandwich or bowl of soup is an example of a good first meal. Heavy or fried foods are harder to digest and may make you feel nauseous or bloated. Drink plenty of fluids but you should avoid alcoholic beverages for 24 hours. If you had a esophageal dilation, please see attached instructions for diet.    ACTIVITY: Your care partner should take you home directly after the procedure. You should plan to take it easy, moving slowly for the rest of the day. You can resume normal activity the day after the procedure however YOU SHOULD NOT DRIVE, use power tools, machinery or perform tasks that involve climbing or major physical exertion for 24 hours (because of the sedation medicines used during the test).   SYMPTOMS TO REPORT IMMEDIATELY: A gastroenterologist can be reached at any hour. Please call [phone]  for any of the following symptoms:  . Following lower endoscopy (colonoscopy, flexible sigmoidoscopy) Excessive amounts of blood in the stool  Significant tenderness, worsening of abdominal pains  Swelling of the abdomen that is new, acute  Fever of 100  or higher  . Following upper endoscopy (EGD, EUS, ERCP, esophageal dilation) Vomiting of blood or coffee ground material  New, significant abdominal pain  New, significant chest pain or pain under the shoulder blades  Painful or persistently difficult swallowing  New shortness of breath  Black, tarry-looking or red, bloody stools  FOLLOW UP:  If any biopsies were taken you will be contacted by phone or by letter within the next 1-3 weeks. Call [phone]  if you have not heard about the biopsies in 3 weeks.  Please also call with any specific questions about appointments or follow up tests. YOU HAD AN ENDOSCOPIC PROCEDURE TODAY: Refer to the procedure report and other information in the discharge instructions given to you for any specific questions about what was found during the examination. If this information does not answer your questions, please call [facility] office at [phone] to clarify.   YOU SHOULD EXPECT: Some feelings of bloating in the abdomen. Passage of more gas than usual. Walking can help get rid of the air that was put into your GI tract during the procedure and reduce the bloating. If you had a lower endoscopy (such as a colonoscopy or flexible sigmoidoscopy) you may notice spotting of blood in your stool or on the toilet paper. Some abdominal soreness may be present for a day or two, also.  DIET: Your first meal following the procedure should be a light meal and then it is ok to progress to your normal diet. A half-sandwich or bowl of soup   example of a good first meal. Heavy or fried foods are harder to digest and may make you feel nauseous or bloated. Drink plenty of fluids but you should avoid alcoholic beverages for 24 hours. If you had a esophageal dilation, please see attached instructions for diet.    ACTIVITY: Your care partner should take you home directly after the procedure. You should plan to take it easy, moving slowly for the rest of the day. You can resume  normal activity the day after the procedure however YOU SHOULD NOT DRIVE, use power tools, machinery or perform tasks that involve climbing or major physical exertion for 24 hours (because of the sedation medicines used during the test).   SYMPTOMS TO REPORT IMMEDIATELY: A gastroenterologist can be reached at any hour. Please call [phone]  for any of the following symptoms:  Following lower endoscopy (colonoscopy, flexible sigmoidoscopy) Excessive amounts of blood in the stool  Significant tenderness, worsening of abdominal pains  Swelling of the abdomen that is new, acute  Fever of 100 or higher  Following upper endoscopy (EGD, EUS, ERCP, esophageal dilation) Vomiting of blood or coffee ground material  New, significant abdominal pain  New, significant chest pain or pain under the shoulder blades  Painful or persistently difficult swallowing  New shortness of breath  Black, tarry-looking or red, bloody stools  FOLLOW UP:  If any biopsies were taken you will be contacted by phone or by letter within the next 1-3 weeks. Call [phone]  if you have not heard about the biopsies in 3 weeks.  Please also call with any specific questions about appointments or follow up tests.YOU HAD AN ENDOSCOPIC PROCEDURE TODAY: Refer to the procedure report and other information in the discharge instructions given to you for any specific questions about what was found during the examination. If this information does not answer your questions, please call [facility] office at [phone] to clarify.   YOU SHOULD EXPECT: Some feelings of bloating in the abdomen. Passage of more gas than usual. Walking can help get rid of the air that was put into your GI tract during the procedure and reduce the bloating. If you had a lower endoscopy (such as a colonoscopy or flexible sigmoidoscopy) you may notice spotting of blood in your stool or on the toilet paper. Some abdominal soreness may be present for a day or two,  also.  DIET: Your first meal following the procedure should be a light meal and then it is ok to progress to your normal diet. A half-sandwich or bowl of soup is an example of a good first meal. Heavy or fried foods are harder to digest and may make you feel nauseous or bloated. Drink plenty of fluids but you should avoid alcoholic beverages for 24 hours. If you had a esophageal dilation, please see attached instructions for diet.    ACTIVITY: Your care partner should take you home directly after the procedure. You should plan to take it easy, moving slowly for the rest of the day. You can resume normal activity the day after the procedure however YOU SHOULD NOT DRIVE, use power tools, machinery or perform tasks that involve climbing or major physical exertion for 24 hours (because of the sedation medicines used during the test).   SYMPTOMS TO REPORT IMMEDIATELY: A gastroenterologist can be reached at any hour. Please call [phone]  for any of the following symptoms:  Following lower endoscopy (colonoscopy, flexible sigmoidoscopy) Excessive amounts of blood in the stool  Significant tenderness, worsening   of abdominal pains  Swelling of the abdomen that is new, acute  Fever of 100 or higher  Following upper endoscopy (EGD, EUS, ERCP, esophageal dilation) Vomiting of blood or coffee ground material  New, significant abdominal pain  New, significant chest pain or pain under the shoulder blades  Painful or persistently difficult swallowing  New shortness of breath  Black, tarry-looking or red, bloody stools  FOLLOW UP:  If any biopsies were taken you will be contacted by phone or by letter within the next 1-3 weeks. Call [phone]  if you have not heard about the biopsies in 3 weeks.  Please also call with any specific questions about appointments or follow up tests. YOU HAD AN ENDOSCOPIC PROCEDURE TODAY: Refer to the procedure report and other information in the discharge instructions  given to you for any specific questions about what was found during the examination. If this information does not answer your questions, please call [facility] office at [phone] to clarify.   YOU SHOULD EXPECT: Some feelings of bloating in the abdomen. Passage of more gas than usual. Walking can help get rid of the air that was put into your GI tract during the procedure and reduce the bloating. If you had a lower endoscopy (such as a colonoscopy or flexible sigmoidoscopy) you may notice spotting of blood in your stool or on the toilet paper. Some abdominal soreness may be present for a day or two, also.  DIET: Your first meal following the procedure should be a light meal and then it is ok to progress to your normal diet. A half-sandwich or bowl of soup is an example of a good first meal. Heavy or fried foods are harder to digest and may make you feel nauseous or bloated. Drink plenty of fluids but you should avoid alcoholic beverages for 24 hours. If you had a esophageal dilation, please see attached instructions for diet.    ACTIVITY: Your care partner should take you home directly after the procedure. You should plan to take it easy, moving slowly for the rest of the day. You can resume normal activity the day after the procedure however YOU SHOULD NOT DRIVE, use power tools, machinery or perform tasks that involve climbing or major physical exertion for 24 hours (because of the sedation medicines used during the test).   SYMPTOMS TO REPORT IMMEDIATELY: A gastroenterologist can be reached at any hour. Please call [phone]  for any of the following symptoms:  Following lower endoscopy (colonoscopy, flexible sigmoidoscopy) Excessive amounts of blood in the stool  Significant tenderness, worsening of abdominal pains  Swelling of the abdomen that is new, acute  Fever of 100 or higher  Following upper endoscopy (EGD, EUS, ERCP, esophageal dilation) Vomiting of blood or coffee ground material   New, significant abdominal pain  New, significant chest pain or pain under the shoulder blades  Painful or persistently difficult swallowing  New shortness of breath  Black, tarry-looking or red, bloody stools  FOLLOW UP:  If any biopsies were taken you will be contacted by phone or by letter within the next 1-3 weeks. Call [phone]  if you have not heard about the biopsies in 3 weeks.  Please also call with any specific questions about appointments or follow up tests. 

## 2020-06-22 NOTE — Progress Notes (Signed)
Pt ready to be discharged to home. A/Ox3. Skin w/d/pink. Resp wnl, equal and non-labored. Denies pain, nausea, dizziness, lightheadedness, HA or any other discomfort. NAD. No complaints voiced. IV d/c'd and intact. Bleeding controlled. Wheeled out of Endo via w/c with belongings. Husband to drive her home.

## 2020-06-22 NOTE — Anesthesia Postprocedure Evaluation (Signed)
Anesthesia Post Note  Patient: SHAREECE BULTMAN  Procedure(s) Performed: COLONOSCOPY WITH PROPOFOL (N/A ) ESOPHAGOGASTRODUODENOSCOPY (EGD) WITH PROPOFOL (N/A ) SUBMUCOSAL TATTOO INJECTION SCLEROTHERAPY POLYPECTOMY BIOPSY     Patient location during evaluation: Endoscopy Anesthesia Type: MAC Level of consciousness: awake and alert Pain management: pain level controlled Vital Signs Assessment: post-procedure vital signs reviewed and stable Respiratory status: spontaneous breathing, nonlabored ventilation, respiratory function stable and patient connected to nasal cannula oxygen Cardiovascular status: blood pressure returned to baseline and stable Postop Assessment: no apparent nausea or vomiting Anesthetic complications: no   No complications documented.  Last Vitals:  Vitals:   06/22/20 1130 06/22/20 1204  BP: (!) 141/59 (!) 168/76  Pulse: 65 (!) 54  Resp: 20 19  Temp:    SpO2: 98% 97%    Last Pain:  Vitals:   06/22/20 1130  TempSrc:   PainSc: 2                  Barnet Glasgow

## 2020-06-22 NOTE — Transfer of Care (Signed)
Immediate Anesthesia Transfer of Care Note  Patient: [name]  Procedure(s) Performed: COLONOSCOPY WITH PROPOFOL (N/A ) ESOPHAGOGASTRODUODENOSCOPY (EGD) WITH PROPOFOL (N/A ) SUBMUCOSAL TATTOO INJECTION SCLEROTHERAPY POLYPECTOMY BIOPSY  Patient Location: Endoscopy Unit  Anesthesia Type:MAC  Level of Consciousness: awake, alert , oriented and patient cooperative  Airway & Oxygen Therapy: Patient Spontanous Breathing and Patient connected to face mask oxygen  Post-op Assessment: Report given to RN, Post -op Vital signs reviewed and stable and Patient moving all extremities  Post vital signs: Reviewed and stable  Last Vitals:  Vitals Value Taken Time  BP 143/92 06/22/20 0952  Temp 36.7 C 06/22/20 0952  Pulse 73 06/22/20 0954  Resp 18 06/22/20 0954  SpO2 96 % 06/22/20 0954  Vitals shown include unvalidated device data.  Last Pain:  Vitals:   06/22/20 0952  TempSrc: Axillary  PainSc: 0-No pain         Complications: No complications documented.

## 2020-06-22 NOTE — Progress Notes (Signed)
Pt reporting pain and nausea, Dr Therisa Doyne notified, orders received.

## 2020-06-22 NOTE — Interval H&P Note (Signed)
History and Physical Interval Note: 65/female with multiple tubular adenomas removed in 2019,history of gastric carcinoid, s/o surgical removal in 2014 with diarrhea, LUQ pain,morbid obesity for an EGD and colonoscopy.  06/22/2020 8:04 AM  [name]  has presented today for EGD and colonoscopy, with the diagnosis of Hx of colon poylps, Diarrhea, abd pain.  The various methods of treatment have been discussed with the patient and family. After consideration of risks, benefits and other options for treatment, the patient has consented to  Procedure(s): COLONOSCOPY WITH PROPOFOL (N/A) ESOPHAGOGASTRODUODENOSCOPY (EGD) WITH PROPOFOL (N/A) as a surgical intervention.  The patient's history has been reviewed, patient examined, no change in status, stable for surgery.  I have reviewed the patient's chart and labs.  Questions were answered to the patient's satisfaction.     [name]

## 2020-06-22 NOTE — Op Note (Signed)
[facility] Patient Name: [name] Procedure Date: 06/22/2020 MRN: [id number] Attending MD: Ronnette Juniper , MD Date of Birth: August 31, 1955 CSN: [id number] Age: 65 Admit Type: Outpatient Procedure:                Colonoscopy Indications:              Diarrhea Providers:                Ronnette Juniper, MD, Particia Nearing, RN, Janee Morn,                            Technician Referring MD:             [facility] Medicines:                Monitored Anesthesia Care Complications:            No immediate complications. Estimated blood loss:                            Minimal. Estimated Blood Loss:     Estimated blood loss was minimal. Procedure:                Pre-Anesthesia Assessment:                           - Prior to the procedure, a History and Physical                            was performed, and patient medications and                            allergies were reviewed. The patient's tolerance of                            previous anesthesia was also reviewed. The risks                            and benefits of the procedure and the sedation                            options and risks were discussed with the patient.                            All questions were answered, and informed consent                            was obtained. Prior Anticoagulants: The patient has                            taken no previous anticoagulant or antiplatelet                            agents. ASA Grade Assessment: III - A patient with                            severe systemic disease. After  reviewing the risks                            and benefits, the patient was deemed in                            satisfactory condition to undergo the procedure.                           After obtaining informed consent, the colonoscope                            was passed under direct vision. Throughout the                            procedure, the patient's blood  pressure, pulse, and                            oxygen saturations were monitored continuously. The                            [id number]-[id number] ([id number] Olympus pediatric colonscope                            was introduced through the anus and advanced to the                            the cecum, identified by appendiceal orifice and                            ileocecal valve. The colonoscopy was performed                            without difficulty. The patient tolerated the                            procedure well. The quality of the bowel                            preparation was fair. Scope In: 9:24:58 AM Scope Out: 9:41:15 AM Scope Withdrawal Time: 0 hours 9 minutes 12 seconds  Total Procedure Duration: 0 hours 16 minutes 17 seconds  Findings:      The perianal and digital rectal examinations were normal. Pertinent       negatives include normal sphincter tone.      Three sessile polyps were found in the descending colon, ascending colon       and cecum. The polyps were 4 to 6 mm in size. These polyps were removed       with a piecemeal technique using a cold biopsy forceps. Resection and       retrieval were complete.      Biopsies for histology were taken with a cold forceps for evaluation of       microscopic colitis.      Multiple small-mouthed diverticula were found in the sigmoid colon and       descending  colon.      Non-bleeding internal hemorrhoids were found during retroflexion. The       hemorrhoids were small.      A moderate amount of liquid semi-liquid stool was found at the hepatic       flexure, in the ascending colon and in the cecum, making visualization       difficult. Lavage of the area was performed, resulting in clearance with       fair visualization. Impression:               - Preparation of the colon was fair.                           - Three 4 to 6 mm polyps in the descending colon,                            in the ascending colon and in the cecum,  removed                            piecemeal using a cold biopsy forceps. Resected and                            retrieved.                           - Diverticulosis in the sigmoid colon and in the                            descending colon.                           - Non-bleeding internal hemorrhoids.                           - Stool at the hepatic flexure, in the ascending                            colon and in the cecum.                           - Biopsies were taken with a cold forceps for                            evaluation of microscopic colitis. Moderate Sedation:      Patient did not receive moderate sedation for this procedure, but       instead received monitored anesthesia care. Recommendation:           - Patient has a contact number available for                            emergencies. The signs and symptoms of potential                            delayed complications were discussed with the  patient. Return to normal activities tomorrow.                            Written discharge instructions were provided to the                            patient.                           - Resume regular diet.                           - Continue present medications.                           - Await pathology results.                           - Repeat colonoscopy for surveillance based on                            pathology results. Procedure Code(s):        --- Professional ---                           [phone], Colonoscopy, flexible; with biopsy, single                            or multiple Diagnosis Code(s):        --- Professional ---                           K64.8, Other hemorrhoids                           K63.5, Polyp of colon                           R19.7, Diarrhea, unspecified                           K57.30, Diverticulosis of large intestine without                            perforation or abscess without bleeding CPT copyright  2019 [employer]. All rights reserved. The codes documented in this report are preliminary and upon coder review may  be revised to meet current compliance requirements. Ronnette Juniper, MD 06/22/2020 10:00:48 AM This report has been signed electronically. Number of Addenda: 0

## 2020-06-22 NOTE — Progress Notes (Signed)
Dr Therisa Doyne notified of patients resolving pain but complaints of weakness.  BSG checked 164, vitals stable.  Orders to continue to observe patient.

## 2020-06-23 ENCOUNTER — Encounter (HOSPITAL_COMMUNITY): Payer: Self-pay | Admitting: Gastroenterology

## 2020-06-24 LAB — SURGICAL PATHOLOGY

## 2020-09-29 ENCOUNTER — Encounter: Payer: Self-pay | Admitting: *Deleted

## 2020-10-01 ENCOUNTER — Ambulatory Visit: Payer: Commercial Managed Care - PPO | Admitting: Neurology

## 2020-10-01 ENCOUNTER — Encounter: Payer: Self-pay | Admitting: Neurology

## 2020-10-01 ENCOUNTER — Other Ambulatory Visit: Payer: Self-pay

## 2020-10-01 ENCOUNTER — Ambulatory Visit
Admission: RE | Admit: 2020-10-01 | Discharge: 2020-10-01 | Disposition: A | Discharge status: Home / Self Care | Payer: Commercial Managed Care - PPO | Source: Ambulatory Visit | Attending: Neurology | Admitting: Neurology

## 2020-10-01 VITALS — BP 165/89 | HR 78 | Ht 63.0 in | Wt 273.5 lb

## 2020-10-01 DIAGNOSIS — R202 Paresthesia of skin: Secondary | ICD-10-CM

## 2020-10-01 DIAGNOSIS — M79672 Pain in left foot: Secondary | ICD-10-CM

## 2020-10-01 MED ORDER — DULOXETINE HCL 60 MG PO CPEP
60.0000 mg | ORAL_CAPSULE | Freq: Every day | ORAL | 12 refills | Status: DC
Start: 2020-10-01 — End: 2020-11-24
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
## 2020-10-24 ENCOUNTER — Other Ambulatory Visit: Payer: Self-pay | Admitting: Neurology

## 2020-11-23 ENCOUNTER — Other Ambulatory Visit: Payer: Self-pay | Admitting: Neurology

## 2020-11-24 ENCOUNTER — Ambulatory Visit: Payer: Commercial Managed Care - PPO | Admitting: Neurology

## 2020-11-24 ENCOUNTER — Ambulatory Visit (INDEPENDENT_AMBULATORY_CARE_PROVIDER_SITE_OTHER): Payer: Commercial Managed Care - PPO | Admitting: Neurology

## 2020-11-24 DIAGNOSIS — R52 Pain, unspecified: Secondary | ICD-10-CM | POA: Diagnosis not present

## 2020-11-24 DIAGNOSIS — R202 Paresthesia of skin: Secondary | ICD-10-CM

## 2020-11-24 NOTE — Procedures (Signed)
Full Name: [name] Gender: Female MRN #: [id number] Date of Birth: [birth year]    Visit Date: 11/24/2020 07:15 Age: 65 Years Examining Physician: Marcial Pacas, MD  Referring Physician: Marcial Pacas, MD History: 65 year old female, complains of bilateral lower extremity frequent paresthesia, numbness, diffuse body achy pain  Summary of the test: Nerve conduction study: Bilateral sural, superficial peroneal sensory responses were normal.  Bilateral tibial, peroneal to EDB motor responses were normal  Electromyography:  Selected needle examination of bilateral lower extremity muscles and bilateral lumbosacral paraspinal muscles were normal.  Conclusion: This is a normal study.  There is no electrodiagnostic evidence of large fiber peripheral neuropathy or bilateral lumbosacral radiculopathy.    ------------------------------- Marcial Pacas M.D. PhD  [facility] [address] [place], [place] [address] Tel: [phone] Fax: [phone]  Verbal informed consent was obtained from the patient, patient was informed of potential risk of procedure, including bruising, bleeding, hematoma formation, infection, muscle weakness, muscle pain, numbness, among others.        [place]    Nerve / Sites Muscle Latency Ref. Amplitude Ref. Rel Amp Segments Distance Velocity Ref. Area    ms ms mV mV %  cm m/s m/s mVms  R Peroneal - EDB     Ankle EDB 4.0 ?6.5 3.6 ?2.0 100 Ankle - EDB 9   14.0     Fib head EDB 9.7  2.8  79.6 Fib head - Ankle 25 44 ?44 13.1     Pop fossa EDB 12.0  2.9  103 Pop fossa - Fib head 10 44 ?44 13.2         Pop fossa - Ankle      L Peroneal - EDB     Ankle EDB 4.5 ?6.5 3.7 ?2.0 100 Ankle - EDB 9   12.6     Fib head EDB 10.0  3.2  85.4 Fib head - Ankle 24 44 ?44 11.2     Pop fossa EDB 12.3  3.4  106 Pop fossa - Fib head 10 44 ?44 12.1         Pop fossa - Ankle      R Tibial - AH     Ankle AH 3.1 ?5.8 8.8 ?4.0 100 Ankle - AH 9   15.1      Pop fossa AH 11.3  6.2  70.6 Pop fossa - Ankle 34 42 ?41 13.7  L Tibial - AH     Ankle AH 3.7 ?5.8 6.8 ?4.0 100 Ankle - AH 9   11.9     Pop fossa AH 11.8  6.4  95 Pop fossa - Ankle 34 42 ?41 13.1             SNC    Nerve / Sites Rec. Site Peak Lat Ref.  Amp Ref. Segments Distance    ms ms V V  cm  R Sural - Ankle (Calf)     Calf Ankle 2.8 ?4.4 8 ?6 Calf - Ankle 10  L Sural - Ankle (Calf)     Calf Ankle 2.9 ?4.4 8 ?6 Calf - Ankle 10  R Superficial peroneal - Ankle     Lat leg Ankle 2.6 ?4.4 10 ?6 Lat leg - Ankle 10  L Superficial peroneal - Ankle     Lat leg Ankle 2.6 ?4.4 8 ?6 Lat leg - Ankle 10             F  Wave  Nerve F Lat Ref.   ms ms  R Tibial - AH 49.0 ?56.0  L Tibial - AH 49.0 ?56.0         H Reflex    Nerve H Lat   ms   Left Right Ref.  Tibial - [place] 36.1 35.5 ?35.0         EMG Summary Table    Spontaneous MUAP Recruitment  Muscle IA Fib PSW Fasc Other Amp Dur. Poly Pattern  L. Tibialis anterior Normal None None None _______ Normal Normal Normal Normal  L. Tibialis posterior Normal None None None _______ Normal Normal Normal Normal  L. Gastrocnemius (Medial head) Normal None None None _______ Normal Normal Normal Normal  L. Peroneus longus Normal None None None _______ Normal Normal Normal Normal  L. Vastus lateralis Normal None None None _______ Normal Normal Normal Normal  R. Tibialis anterior Normal None None None _______ Normal Normal Normal Normal  R. Tibialis posterior Normal None None None _______ Normal Normal Normal Normal  R. Peroneus longus Normal None None None _______ Normal Normal Normal Normal  R. Gastrocnemius (Medial head) Normal None None None _______ Normal Normal Normal Normal  R. Vastus lateralis Normal None None None _______ Normal Normal Normal Normal  R. Lumbar paraspinals (low) Normal None None None _______ Normal Normal Normal Normal  R. Lumbar paraspinals (mid) Normal None None None _______ Normal Normal Normal Normal  L.  Lumbar paraspinals (low) Normal None None None _______ Normal Normal Normal Normal  L. Lumbar paraspinals (mid) Normal None None None _______ Normal Normal Normal Normal
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
## 2020-11-25 ENCOUNTER — Telehealth: Payer: Self-pay | Admitting: Neurology

## 2020-11-25 LAB — TSH: TSH: 2.13 u[IU]/mL (ref 0.450–4.500)

## 2020-11-25 LAB — C-REACTIVE PROTEIN: CRP: 18 mg/L — ABNORMAL HIGH (ref 0–10)

## 2020-11-25 LAB — CK: Total CK: 27 U/L — ABNORMAL LOW (ref 32–182)

## 2020-11-25 LAB — VITAMIN B12: Vitamin B-12: 369 pg/mL (ref 232–1245)

## 2020-11-25 LAB — HGB A1C W/O EAG: Hgb A1c MFr Bld: 7.3 % — ABNORMAL HIGH (ref 4.8–5.6)

## 2020-11-25 LAB — ANA W/REFLEX IF POSITIVE: Anti Nuclear Antibody (ANA): NEGATIVE

## 2020-11-25 LAB — VITAMIN D 25 HYDROXY (VIT D DEFICIENCY, FRACTURES): Vit D, 25-Hydroxy: 39.4 ng/mL (ref 30.0–100.0)

## 2020-11-25 LAB — SEDIMENTATION RATE: Sed Rate: 42 mm/hr — ABNORMAL HIGH (ref 0–40)

## 2020-11-25 LAB — RPR: RPR Ser Ql: NONREACTIVE

## 2020-11-25 MED ORDER — PREDNISONE 5 MG PO TABS: 10.0000 mg | ORAL_TABLET | Freq: Every day | ORAL | 3 refills | Status: DC

## 2020-11-25 NOTE — Telephone Encounter (Signed)
Please call patient, laboratory evaluation does demonstrate elevated ESR 42, C-reactive protein 18, also mild elevated A1c 7.3, consistent with her diagnosis of diabetes  Elevated ESR C-reactive protein could indicate a systemic inflammatory process, such as polymyalgia rheumatica, especially in the setting of her diffuse body achy pain, upon deep palpitation  If she is still think she suffered significant pain, not well controlled by current medications, may consider low-dose of prednisone 10 mg daily followed by quick tapering, but prednisone treatment is often associated worsening diabetes

## 2020-11-25 NOTE — Telephone Encounter (Signed)
I spoke to the patient. She verbalized understanding of the lab findings. She is still having significant pain and would like to start a trial of prednisone.   Since the patient is being prescribed this medication, when would you like to see her back in the office?

## 2020-11-25 NOTE — Telephone Encounter (Signed)
I spoke to the patient. She will proceed with Prednisone. Follow up scheduled 12/09/20.

## 2020-11-25 NOTE — Telephone Encounter (Signed)
Meds ordered this encounter  Medications   predniSONE (DELTASONE) 5 MG tablet    Sig: Take 2 tablets (10 mg total) by mouth daily with breakfast.    Dispense:  60 tablet    Refill:  3     Give her a follow up with me in 2 weeks, no later than 3 weeks.

## 2020-11-25 NOTE — Addendum Note (Signed)
Addended by: Marcial Pacas on: 11/25/2020 04:50 PM   Modules accepted: Orders

## 2020-12-09 ENCOUNTER — Ambulatory Visit: Payer: Commercial Managed Care - PPO | Admitting: Neurology

## 2020-12-14 ENCOUNTER — Other Ambulatory Visit: Payer: Self-pay | Admitting: Nurse Practitioner

## 2020-12-14 ENCOUNTER — Other Ambulatory Visit: Payer: Self-pay | Admitting: Internal Medicine

## 2020-12-14 DIAGNOSIS — Z1231 Encounter for screening mammogram for malignant neoplasm of breast: Secondary | ICD-10-CM

## 2021-02-17 ENCOUNTER — Encounter: Payer: Self-pay | Admitting: Internal Medicine

## 2021-02-17 ENCOUNTER — Ambulatory Visit
Admission: RE | Admit: 2021-02-17 | Discharge: 2021-02-17 | Disposition: A | Discharge status: Home / Self Care | Payer: Commercial Managed Care - PPO | Source: Ambulatory Visit | Attending: Internal Medicine | Admitting: Internal Medicine

## 2021-02-17 DIAGNOSIS — Z1231 Encounter for screening mammogram for malignant neoplasm of breast: Secondary | ICD-10-CM

## 2021-03-01 ENCOUNTER — Ambulatory Visit: Payer: Commercial Managed Care - PPO | Admitting: Neurology

## 2021-03-01 ENCOUNTER — Encounter: Payer: Self-pay | Admitting: Neurology

## 2021-03-01 VITALS — BP 188/83 | HR 90 | Wt 273.0 lb

## 2021-03-01 DIAGNOSIS — M353 Polymyalgia rheumatica: Secondary | ICD-10-CM | POA: Insufficient documentation

## 2021-03-01 DIAGNOSIS — R52 Pain, unspecified: Secondary | ICD-10-CM
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
## 2021-03-01 NOTE — Patient Instructions (Addendum)
Prednisone 5mg    1 tab every morning x2 weeks 1/2 tab every morning x2 weeks   Then stop  [facility] [place], [place] [address] Directions  [phone]

## 2021-03-02 LAB — SEDIMENTATION RATE: Sed Rate: 36 mm/hr (ref 0–40)

## 2021-03-02 LAB — C-REACTIVE PROTEIN: CRP: 16 mg/L — ABNORMAL HIGH (ref 0–10)

## 2021-03-02 LAB — HEMOGLOBIN A1C
Est. average glucose Bld gHb Est-mCnc: 177 mg/dL
Hgb A1c MFr Bld: 7.8 % — ABNORMAL HIGH (ref 4.8–5.6)

## 2021-03-02 LAB — RHEUMATOID FACTOR: Rheumatoid fact SerPl-aCnc: <10 IU/mL (ref <14.0)

## 2021-04-15 ENCOUNTER — Ambulatory Visit (HOSPITAL_COMMUNITY)
Admission: EM | Admit: 2021-04-15 | Discharge: 2021-04-15 | Disposition: A | Discharge status: Home / Self Care | Payer: Medicare PPO | Attending: Family Medicine | Admitting: Family Medicine

## 2021-04-15 ENCOUNTER — Other Ambulatory Visit: Payer: Self-pay

## 2021-04-15 ENCOUNTER — Encounter (HOSPITAL_COMMUNITY): Payer: Self-pay

## 2021-04-15 ENCOUNTER — Telehealth: Payer: Self-pay | Admitting: Cardiology

## 2021-04-15 ENCOUNTER — Encounter: Payer: Self-pay | Admitting: Internal Medicine

## 2021-04-15 DIAGNOSIS — I10 Essential (primary) hypertension: Secondary | ICD-10-CM | POA: Diagnosis present

## 2021-04-15 LAB — CBC
HCT: 39.6 % (ref 36.0–46.0)
Hemoglobin: 12.9 g/dL (ref 12.0–15.0)
MCH: 27.7 pg (ref 26.0–34.0)
MCHC: 32.6 g/dL (ref 30.0–36.0)
MCV: 85.2 fL (ref 80.0–100.0)
Platelets: 382 10*3/uL (ref 150–400)
RBC: 4.65 MIL/uL (ref 3.87–5.11)
RDW: 14.8 % (ref 11.5–15.5)
WBC: 15.3 10*3/uL — ABNORMAL HIGH (ref 4.0–10.5)
nRBC: 0 % (ref 0.0–0.2)

## 2021-04-15 LAB — BASIC METABOLIC PANEL
Anion gap: 9 (ref 5–15)
BUN: 13 mg/dL (ref 8–23)
CO2: 29 mmol/L (ref 22–32)
Calcium: 9.5 mg/dL (ref 8.9–10.3)
Chloride: 100 mmol/L (ref 98–111)
Creatinine, Ser: 0.88 mg/dL (ref 0.44–1.00)
GFR, Estimated: >60 mL/min (ref >60)
Glucose, Bld: 129 mg/dL — ABNORMAL HIGH (ref 70–99)
Potassium: 3.5 mmol/L (ref 3.5–5.1)
Sodium: 138 mmol/L (ref 135–145)

## 2021-04-15 LAB — TSH: TSH: 1.149 u[IU]/mL (ref 0.350–4.500)

## 2021-04-15 MED ORDER — AMLODIPINE BESYLATE 5 MG PO TABS: 5.0000 mg | ORAL_TABLET | Freq: Every day | ORAL | 0 refills | Status: DC

## 2021-04-15 NOTE — Discharge Instructions (Addendum)
Your EKG was essentially normal ? ?Continue losartan 100 mg, 1/2 tablet daily ? ?Start taking amlodipine 5 mg, 1 daily for blood pressure. ? ?See your primary care office in the next week or 10 days.  See your cardiologist in the next weeks ?

## 2021-04-15 NOTE — Telephone Encounter (Signed)
Spoke with spouse who reports patient's BP has been 200/100 range for the past week. He also reports patient has chest pressure, left arm pain, back pain, and headache. Denies nausea/vomiting, blurred vision. Recommended that spouse call 911 and have patient taken to ED. Spouse asked if patient could be seen at clinic. I explained that the ED has the equipment, tests, and medication to treat the patient, and that it is safer to get her to the ED. He voiced understanding. ?

## 2021-04-15 NOTE — ED Triage Notes (Signed)
Pt presents with c/o hypertension for past 2 weeks has not been seen by PCP, also states she was having left shoulder pain and she has completed prednisone today but still having pain  ?

## 2021-04-15 NOTE — Telephone Encounter (Signed)
It has been almost 2 years since I last saw her. I think ED evaluation is appropriate given how high her BP is and symptoms ? ?Tiahna Cure [place] MD, [facility] ? ?

## 2021-04-15 NOTE — Telephone Encounter (Signed)
Pt c/o BP issue: STAT if pt c/o blurred vision, one-sided weakness or slurred speech ? ?1. What are your last 5 BP readings?  ?No readings available. ? ?2. Are you having any other symptoms (ex. Dizziness, headache, blurred vision, passed out)?  ?Chest tightness, left arm pain ? ?3. What is your BP issue?  ?Patient's husband states the patient's BP has been elevated over the past week, ranging 200/100 ? ?Pt c/o of Chest Pain: STAT if CP now or developed within 24 hours ? ?1. Are you having CP right now?  ?No pain. Chest tightness, but not currently ? ?2. Are you experiencing any other symptoms (ex. SOB, nausea, vomiting, sweating)?  ?Left arm pain  ? ?3. How long have you been experiencing CP?  ?About 1 week ? ?4. Is your CP continuous or coming and going?  ?Coming and going  ? ?5. Have you taken Nitroglycerin?  ?No  ?? ? ?
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
## 2021-05-02 ENCOUNTER — Ambulatory Visit: Payer: Medicare PPO | Admitting: Nurse Practitioner

## 2021-05-02 ENCOUNTER — Encounter: Payer: Self-pay | Admitting: Nurse Practitioner

## 2021-05-02 ENCOUNTER — Other Ambulatory Visit: Payer: Self-pay

## 2021-05-02 VITALS — BP 146/83 | HR 95 | Ht 63.0 in | Wt 278.0 lb

## 2021-05-02 DIAGNOSIS — R079 Chest pain, unspecified: Secondary | ICD-10-CM | POA: Diagnosis not present

## 2021-05-02 DIAGNOSIS — I10 Essential (primary) hypertension: Secondary | ICD-10-CM

## 2021-05-02 DIAGNOSIS — I2510 Atherosclerotic heart disease of native coronary artery without angina pectoris: Secondary | ICD-10-CM

## 2021-05-02 DIAGNOSIS — R0609 Other forms of dyspnea: Secondary | ICD-10-CM

## 2021-05-02 DIAGNOSIS — D72829 Elevated white blood cell count, unspecified: Secondary | ICD-10-CM

## 2021-05-02 DIAGNOSIS — Z79899 Other long term (current) drug therapy: Secondary | ICD-10-CM

## 2021-05-02 DIAGNOSIS — R238 Other skin changes: Secondary | ICD-10-CM

## 2021-05-02 DIAGNOSIS — M7989 Other specified soft tissue disorders: Secondary | ICD-10-CM

## 2021-05-02 DIAGNOSIS — R600 Localized edema: Secondary | ICD-10-CM

## 2021-05-02 DIAGNOSIS — E785 Hyperlipidemia, unspecified: Secondary | ICD-10-CM

## 2021-05-02 DIAGNOSIS — Z85528 Personal history of other malignant neoplasm of kidney: Secondary | ICD-10-CM

## 2021-05-02 DIAGNOSIS — D649 Anemia, unspecified: Secondary | ICD-10-CM

## 2021-05-02 DIAGNOSIS — E119 Type 2 diabetes mellitus without complications: Secondary | ICD-10-CM

## 2021-05-02 DIAGNOSIS — G4733 Obstructive sleep apnea (adult) (pediatric): Secondary | ICD-10-CM

## 2021-05-02 MED ORDER — CARVEDILOL 6.25 MG PO TABS: 6.2500 mg | ORAL_TABLET | Freq: Two times a day (BID) | ORAL | 3 refills | Status: DC

## 2021-05-02 NOTE — Patient Instructions (Signed)
Medication Instructions:  ?Increase Lasix 40 mg daily for the next 3 days and take Potassium 40 mg for 3 days.  ?Start Carvedilol 6.25 mg bid  ? ?*If you need a refill on your cardiac medications before your next appointment, please call your pharmacy* ? ? ?Lab Work: ?Your physician recommends that you complete labs today ?BMET ? ?If you have labs (blood work) drawn today and your tests are completely normal, you will receive your results only by: ?MyChart Message (if you have MyChart) OR ?A paper copy in the mail ?If you have any lab test that is abnormal or we need to change your treatment, we will call you to review the results. ? ? ?Testing/Procedures: ?Your physician has requested that you have an echocardiogram. Echocardiography is a painless test that uses sound waves to create images of your heart. It provides your doctor with information about the size and shape of your heart and how well your heart?s chambers and valves are working. This procedure takes approximately one hour. There are no restrictions for this procedure.  ? ?Your physician has requested that you have a lexiscan myoview. For further information please visit [email or web address]. Please follow instruction sheet, as given.  ? ? ? ? ?Follow-Up: ?At [facility], you and your health needs are our priority.  As part of our continuing mission to provide you with exceptional heart care, we have created designated Provider Care Teams.  These Care Teams include your primary Cardiologist (physician) and Advanced Practice Providers (APPs -  Physician Assistants and Nurse Practitioners) who all work together to provide you with the care you need, when you need it. ? ?We recommend signing up for the patient portal called "MyChart".  Sign up information is provided on this After Visit Summary.  MyChart is used to connect with patients for Virtual Visits (Telemedicine).  Patients are able to view lab/test results, encounter notes, upcoming appointments,  etc.  Non-urgent messages can be sent to your provider as well.   ?To learn more about what you can do with MyChart, go to [email or web address].   ? ?Your next appointment:   ?6 week(s) ? ?The format for your next appointment:   ?In Person ? ?Provider:   ?Peter Martinique, MD  or Diona Browner, NP      ? ? ? ? ?
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
## 2021-05-03 LAB — BASIC METABOLIC PANEL
BUN/Creatinine Ratio: 15 (ref 12–28)
BUN: 9 mg/dL (ref 8–27)
CO2: 23 mmol/L (ref 20–29)
Calcium: 9.5 mg/dL (ref 8.7–10.3)
Chloride: 100 mmol/L (ref 96–106)
Creatinine, Ser: 0.62 mg/dL (ref 0.57–1.00)
Glucose: 221 mg/dL — ABNORMAL HIGH (ref 70–99)
Potassium: 4.2 mmol/L (ref 3.5–5.2)
Sodium: 143 mmol/L (ref 134–144)
eGFR: 99 mL/min/{1.73_m2} (ref >59)

## 2021-05-04 ENCOUNTER — Telehealth: Payer: Self-pay

## 2021-05-04 NOTE — Telephone Encounter (Addendum)
Spoke with pt. Pt was notified of results and recommendations. ----- Message from Lenna Sciara, NP sent at 05/03/2021  3:49 PM EDT ----- ?Recent labs show stable kidney function and electrolytes.  Continue current medications and follow-up as planned.  Thank you. ?

## 2021-05-09 MED ORDER — AMLODIPINE BESYLATE 5 MG PO TABS: 5.0000 mg | ORAL_TABLET | Freq: Every day | ORAL | 3 refills | Status: DC

## 2021-05-11 ENCOUNTER — Inpatient Hospital Stay (HOSPITAL_COMMUNITY): Admission: RE | Admit: 2021-05-11 | Payer: Commercial Managed Care - PPO | Source: Ambulatory Visit

## 2021-05-12 ENCOUNTER — Encounter: Payer: Self-pay | Admitting: Internal Medicine

## 2021-05-12 ENCOUNTER — Ambulatory Visit (HOSPITAL_COMMUNITY): Payer: Medicare PPO | Attending: Cardiology

## 2021-05-12 DIAGNOSIS — R0609 Other forms of dyspnea: Secondary | ICD-10-CM | POA: Insufficient documentation

## 2021-05-12 LAB — ECHOCARDIOGRAM COMPLETE
Area-P 1/2: 3.37 cm2
S' Lateral: 3.2 cm

## 2021-05-13 ENCOUNTER — Telehealth: Payer: Self-pay

## 2021-05-13 NOTE — Telephone Encounter (Signed)
Spoke with pt. Pt was notified of echo results and will follow up as directed.  ?

## 2021-05-17 ENCOUNTER — Telehealth (HOSPITAL_COMMUNITY): Payer: Self-pay | Admitting: *Deleted

## 2021-05-17 NOTE — Telephone Encounter (Signed)
Close encounter 

## 2021-05-18 ENCOUNTER — Ambulatory Visit (HOSPITAL_COMMUNITY)
Admission: RE | Admit: 2021-05-18 | Discharge: 2021-05-18 | Disposition: A | Discharge status: Home / Self Care | Payer: Medicare PPO | Source: Ambulatory Visit | Attending: Cardiovascular Disease | Admitting: Cardiovascular Disease

## 2021-05-18 DIAGNOSIS — R0609 Other forms of dyspnea: Secondary | ICD-10-CM

## 2021-05-18 MED ORDER — TECHNETIUM TC 99M TETROFOSMIN IV KIT
31.0000 | PACK | Freq: Once | INTRAVENOUS | Status: AC | PRN
  Administered 2021-05-18: 31 via INTRAVENOUS
  Filled 2021-05-18: qty 31

## 2021-05-18 MED ORDER — REGADENOSON 0.4 MG/5ML IV SOLN
0.4000 mg | Freq: Once | INTRAVENOUS | Status: AC
  Administered 2021-05-18: 0.4 mg via INTRAVENOUS

## 2021-05-19 ENCOUNTER — Ambulatory Visit (HOSPITAL_COMMUNITY)
Admission: RE | Admit: 2021-05-19 | Discharge: 2021-05-19 | Disposition: A | Discharge status: Home / Self Care | Payer: Medicare PPO | Source: Ambulatory Visit | Attending: Cardiovascular Disease | Admitting: Cardiovascular Disease

## 2021-05-19 MED ORDER — TECHNETIUM TC 99M TETROFOSMIN IV KIT
30.5000 | PACK | Freq: Once | INTRAVENOUS | Status: AC | PRN
  Administered 2021-05-19: 30.5 via INTRAVENOUS

## 2021-05-20 ENCOUNTER — Telehealth: Payer: Self-pay

## 2021-05-20 LAB — MYOCARDIAL PERFUSION IMAGING
LV dias vol: 77 mL (ref 46–106)
LV sys vol: 25 mL
Nuc Stress EF: 67 %
Peak HR: 95 {beats}/min
Rest HR: 78 {beats}/min
Rest Nuclear Isotope Dose: 30.5 mCi
SDS: 0
SRS: 0
SSS: 0
ST Depression (mm): 0 mm
Stress Nuclear Isotope Dose: 31 mCi
TID: 0.82

## 2021-05-20 NOTE — Progress Notes (Signed)
Called patient

## 2021-05-20 NOTE — Telephone Encounter (Addendum)
Called patient regarding results. Patient had understanding of results.----- Message from Lenna Sciara, NP sent at 05/20/2021  7:52 AM EDT ----- ?Recent stress test was normal, showed no evidence of lack of blood flow to the heart muscle, heart pumping function was normal.  This is good news.  I this case, it is very unlikely that your symptoms are coming from your heart.  Continue to work on increasing her activity as tolerated, continue current medications and follow-up as planned.  Thank you.  ?

## 2021-05-23 ENCOUNTER — Other Ambulatory Visit: Payer: Self-pay | Admitting: Neurology

## 2021-06-06 ENCOUNTER — Encounter: Payer: Self-pay | Admitting: Internal Medicine

## 2021-06-13 ENCOUNTER — Ambulatory Visit (INDEPENDENT_AMBULATORY_CARE_PROVIDER_SITE_OTHER): Payer: Medicare PPO | Admitting: Nurse Practitioner

## 2021-06-13 ENCOUNTER — Encounter: Payer: Self-pay | Admitting: Nurse Practitioner

## 2021-06-13 VITALS — BP 125/79 | HR 82 | Ht 63.0 in | Wt 277.4 lb

## 2021-06-13 DIAGNOSIS — E785 Hyperlipidemia, unspecified: Secondary | ICD-10-CM

## 2021-06-13 DIAGNOSIS — I2510 Atherosclerotic heart disease of native coronary artery without angina pectoris: Secondary | ICD-10-CM | POA: Diagnosis not present

## 2021-06-13 DIAGNOSIS — I10 Essential (primary) hypertension: Secondary | ICD-10-CM | POA: Diagnosis not present

## 2021-06-13 DIAGNOSIS — Z6841 Body Mass Index (BMI) 40.0 and over, adult: Secondary | ICD-10-CM

## 2021-06-13 DIAGNOSIS — R0609 Other forms of dyspnea: Secondary | ICD-10-CM

## 2021-06-13 DIAGNOSIS — E119 Type 2 diabetes mellitus without complications: Secondary | ICD-10-CM

## 2021-06-13 DIAGNOSIS — R600 Localized edema: Secondary | ICD-10-CM

## 2021-06-13 DIAGNOSIS — D649 Anemia, unspecified: Secondary | ICD-10-CM

## 2021-06-13 DIAGNOSIS — Z85528 Personal history of other malignant neoplasm of kidney: Secondary | ICD-10-CM

## 2021-06-13 DIAGNOSIS — G4733 Obstructive sleep apnea (adult) (pediatric): Secondary | ICD-10-CM

## 2021-06-13 MED ORDER — FUROSEMIDE 20 MG PO TABS: 20.0000 mg | ORAL_TABLET | ORAL | 3 refills | Status: DC
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
## 2021-06-13 NOTE — Patient Instructions (Signed)
Medication Instructions:  ?Lasix 20 mg daily, take an additional 20 mg on days of swelling (3 lbs weight gain overnight or 5 lbs weight gain in 1 week) ? ?*If you need a refill on your cardiac medications before your next appointment, please call your pharmacy* ? ? ?Lab Work: ?NONE ordered at this time of appointment  ? ?If you have labs (blood work) drawn today and your tests are completely normal, you will receive your results only by: ?MyChart Message (if you have MyChart) OR ?A paper copy in the mail ?If you have any lab test that is abnormal or we need to change your treatment, we will call you to review the results. ? ? ?Testing/Procedures: ?NONE ordered at this time of appointment  ? ? ? ?Follow-Up: ?At [facility], you and your health needs are our priority.  As part of our continuing mission to provide you with exceptional heart care, we have created designated Provider Care Teams.  These Care Teams include your primary Cardiologist (physician) and Advanced Practice Providers (APPs -  Physician Assistants and Nurse Practitioners) who all work together to provide you with the care you need, when you need it. ? ?We recommend signing up for the patient portal called "MyChart".  Sign up information is provided on this After Visit Summary.  MyChart is used to connect with patients for Virtual Visits (Telemedicine).  Patients are able to view lab/test results, encounter notes, upcoming appointments, etc.  Non-urgent messages can be sent to your provider as well.   ?To learn more about what you can do with MyChart, go to [email or web address].   ? ?Your next appointment:   ?3 month(s) ? ?The format for your next appointment:   ?In Person ? ?Provider:   ?Peter Martinique, MD   ? ? ?Other Instructions ?Referred to Pulmonology  ? ?Important Information About Sugar ? ? ? ? ? ? ?

## 2021-06-21 ENCOUNTER — Other Ambulatory Visit: Payer: Self-pay | Admitting: Neurology

## 2021-07-13 ENCOUNTER — Encounter: Payer: Self-pay | Admitting: Internal Medicine

## 2021-07-13 ENCOUNTER — Encounter: Payer: Self-pay | Admitting: Pulmonary Disease

## 2021-07-13 ENCOUNTER — Ambulatory Visit: Payer: Medicare PPO | Admitting: Pulmonary Disease

## 2021-07-13 ENCOUNTER — Ambulatory Visit (INDEPENDENT_AMBULATORY_CARE_PROVIDER_SITE_OTHER): Payer: Medicare PPO

## 2021-07-13 VITALS — BP 132/74 | HR 70 | Temp 98.6°F | Ht 63.0 in | Wt 277.0 lb

## 2021-07-13 DIAGNOSIS — R0609 Other forms of dyspnea: Secondary | ICD-10-CM

## 2021-07-13 DIAGNOSIS — R053 Chronic cough: Secondary | ICD-10-CM

## 2021-07-13 MED ORDER — FLUTICASONE-SALMETEROL 500-50 MCG/ACT IN AEPB: 1.0000 | INHALATION_SPRAY | Freq: Two times a day (BID) | RESPIRATORY_TRACT | 3 refills | Status: DC
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
## 2021-07-13 NOTE — Patient Instructions (Signed)
Nice to meet you  To further evaluate your symptoms I recommend a checks x-ray which we can do today.  In addition, I recommend pulmonary function test.  Please schedule these for next available as it works with your availability.  To help with your symptoms, I recommend inhaler.  Use Advair 1 puff twice a day every day.  Rinse your mouth out after every use.  Return to clinic in 2 months or sooner as needed with Dr. Silas Flood.

## 2021-07-21 ENCOUNTER — Other Ambulatory Visit: Payer: Self-pay | Admitting: Neurology

## 2021-07-28 ENCOUNTER — Other Ambulatory Visit: Payer: Self-pay | Admitting: Nurse Practitioner

## 2021-08-19 ENCOUNTER — Ambulatory Visit: Payer: Medicare PPO | Admitting: Pulmonary Disease

## 2021-08-19 ENCOUNTER — Ambulatory Visit (INDEPENDENT_AMBULATORY_CARE_PROVIDER_SITE_OTHER): Payer: Medicare PPO | Admitting: Pulmonary Disease

## 2021-08-19 ENCOUNTER — Encounter: Payer: Self-pay | Admitting: Pulmonary Disease

## 2021-08-19 VITALS — BP 120/68 | HR 74 | Temp 98.7°F | Ht 63.0 in | Wt 263.0 lb

## 2021-08-19 DIAGNOSIS — R053 Chronic cough: Secondary | ICD-10-CM

## 2021-08-19 DIAGNOSIS — J4540 Moderate persistent asthma, uncomplicated: Secondary | ICD-10-CM | POA: Diagnosis not present

## 2021-08-19 DIAGNOSIS — R0609 Other forms of dyspnea: Secondary | ICD-10-CM

## 2021-08-19 LAB — PULMONARY FUNCTION TEST
DL/VA % pred: 120 %
DL/VA: 5.08 ml/min/mmHg/L
DLCO cor % pred: 101 %
DLCO cor: 19.54 ml/min/mmHg
DLCO unc % pred: 101 %
DLCO unc: 19.54 ml/min/mmHg
FEF 25-75 Post: 2.61 L/sec
FEF 25-75 Pre: 1.84 L/sec
FEF2575-%Change-Post: 42 %
FEF2575-%Pred-Post: 126 %
FEF2575-%Pred-Pre: 88 %
FEV1-%Change-Post: 11 %
FEV1-%Pred-Post: 74 %
FEV1-%Pred-Pre: 66 %
FEV1-Post: 1.73 L
FEV1-Pre: 1.55 L
FEV1FVC-%Change-Post: 1 %
FEV1FVC-%Pred-Pre: 110 %
FEV6-%Change-Post: 9 %
FEV6-%Pred-Post: 68 %
FEV6-%Pred-Pre: 62 %
FEV6-Post: 1.99 L
FEV6-Pre: 1.82 L
FEV6FVC-%Pred-Post: 104 %
FEV6FVC-%Pred-Pre: 104 %
FVC-%Change-Post: 9 %
FVC-%Pred-Post: 65 %
FVC-%Pred-Pre: 59 %
FVC-Post: 1.99 L
FVC-Pre: 1.82 L
Post FEV1/FVC ratio: 87 %
Post FEV6/FVC ratio: 100 %
Pre FEV1/FVC ratio: 85 %
Pre FEV6/FVC Ratio: 100 %
RV % pred: 55 %
RV: 1.13 L
TLC % pred: 72 %
TLC: 3.54 L

## 2021-08-19 MED ORDER — TRELEGY ELLIPTA 100-62.5-25 MCG/ACT IN AEPB: 200.0000 ug | INHALATION_SPRAY | Freq: Once | RESPIRATORY_TRACT | 0 refills | Status: AC

## 2021-08-19 NOTE — Progress Notes (Signed)
Full PFT performed today. °

## 2021-08-19 NOTE — Patient Instructions (Signed)
Nice to see you again  Your pulmonary function tests today were largely normal.  There are subtle signs of asthma.  Since you have improved with the Advair I think asthma is the correct diagnosis.  To further improve your symptoms, I think is worth trying at a different inhaler as a third medicine.  Use Trelegy 1 puff once a day.  This replaces Advair, stop Advair once you start Trelegy.  Rinse your mouth out after every use.  Send me a message in 2 to 3 weeks and let me know if the Trelegy is helping more than the Advair.  If so, I will send a prescription to [facility] which is the mail pharmacy.  It looks like there is a pretty significant discount on the medicine to receive a 20-monthsupply at a time via the mail pharmacy.  Return to clinic in 6 months or sooner as needed with Dr. HSilas Flood
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
## 2021-08-19 NOTE — Patient Instructions (Signed)
Full PFT performed today. °

## 2021-08-22 ENCOUNTER — Encounter: Payer: Self-pay | Admitting: Pulmonary Disease

## 2021-08-23 NOTE — Telephone Encounter (Signed)
Recommend that she stop the Trelegy. If the area of irritation does not resolve in next 2 days then would get her in to be seen at end of the week so we can evaluate

## 2021-08-23 NOTE — Telephone Encounter (Signed)
Will forward to Dr. Lamonte Sakai as Dr. Silas Flood is unavailable for the next several days.   Dr. Lamonte Sakai, please advise on pt's emails. Thanks.

## 2021-08-24 ENCOUNTER — Other Ambulatory Visit: Payer: Self-pay | Admitting: Neurology

## 2021-08-25 NOTE — Telephone Encounter (Signed)
Rx refilled.
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
## 2021-09-14 ENCOUNTER — Ambulatory Visit: Payer: Medicare PPO | Admitting: Cardiology

## 2021-09-14 ENCOUNTER — Encounter: Payer: Self-pay | Admitting: Cardiology

## 2021-09-14 VITALS — BP 132/78 | HR 82 | Resp 20 | Ht 63.0 in | Wt 260.0 lb

## 2021-09-14 DIAGNOSIS — E785 Hyperlipidemia, unspecified: Secondary | ICD-10-CM | POA: Diagnosis not present

## 2021-09-14 DIAGNOSIS — R0609 Other forms of dyspnea: Secondary | ICD-10-CM | POA: Diagnosis not present

## 2021-09-14 DIAGNOSIS — I10 Essential (primary) hypertension: Secondary | ICD-10-CM | POA: Diagnosis not present

## 2021-09-14 DIAGNOSIS — I2510 Atherosclerotic heart disease of native coronary artery without angina pectoris: Secondary | ICD-10-CM | POA: Diagnosis not present

## 2021-09-20 ENCOUNTER — Telehealth: Payer: Self-pay | Admitting: Pulmonary Disease

## 2021-09-20 DIAGNOSIS — G4733 Obstructive sleep apnea (adult) (pediatric): Secondary | ICD-10-CM

## 2021-09-20 NOTE — Telephone Encounter (Signed)
Please advise if she needs a referral for sleep consult?

## 2021-09-21 NOTE — Telephone Encounter (Signed)
Believe her OSA was diagnosed via her PCP. We can send referral to [facility] for consultation regarding Inspire.

## 2021-09-21 NOTE — Telephone Encounter (Signed)
Attempted to call pt's spouse Christia Reading but line went directly to VM. Unable to leave VM as mailbox is full. Will try to call back later.

## 2021-09-22 NOTE — Telephone Encounter (Signed)
Attempted to call pt's spouse but unable to reach. Unable to leave VM as mailbox is full. Due to multiple attempts trying to call pt and unable to reach, per protocol encounter will be closed.

## 2021-09-26 NOTE — Telephone Encounter (Signed)
I left a message per DPR that the referral has been placed. They were asked to call back with any questions.

## 2021-09-26 NOTE — Addendum Note (Signed)
Addended by: Dessie Coma on: 09/26/2021 11:38 AM   Modules accepted: Orders

## 2021-09-30 ENCOUNTER — Other Ambulatory Visit: Payer: Self-pay | Admitting: Nurse Practitioner

## 2021-10-07 ENCOUNTER — Encounter: Payer: Self-pay | Admitting: Pulmonary Disease

## 2021-10-07 ENCOUNTER — Telehealth: Payer: Self-pay | Admitting: *Deleted

## 2021-10-07 NOTE — Telephone Encounter (Signed)
Spoke with pt's husband (per DPR) and reviewed Tammy's advise. Pt's husband stated understanding. Nothing further needed at this time.

## 2021-10-07 NOTE — Telephone Encounter (Signed)
Primary Pulmonologist: Hunsucker Last office visit and with whom: Hunsucker 08/19/2021 What do we see them for (pulmonary problems): Asthma, DOE Last OV assessment/plan:   Assessment & Plan:    Dyspnea on exertion: Likely multifactorial.  High suspicion for deconditioning given decreased activity over last 3 to 4 years.  Concern for contribution from weight.  Also component of asthma given improvement in symptoms with ICS/LABA therapy.  See below for further discussion regarding asthma.  Chest imaging is clear with atelectasis.  PFTs performed 08/2021 largely normal with mild restriction if any present related to habitus.   Chronic cough: Largely dry.  Worse in morning and evenings.  Associate dyspnea addition, high suspicion for asthma.  Marked improvement with ICS/LABA therapy.   Asthma: Clinical diagnosis based on cough, dyspnea, borderline bronchodilator response without any other significant abnormality on PFTs.  Improved with ICS/LABA Advair high-dose.  Given ongoing symptoms, escalate to triple inhaled therapy via Trelegy today.  Samples provided.  If improving will send in 3-monthsupply to [facility] seems like there is significant discount with 331-monthupply sent to mail pharmacy.     Return in about 6 months (around 02/19/2022).     MaLanier ClamMD 08/19/2021            Patient Instructions by HuLanier ClamMD at 08/19/2021 12:00 PM  Author: HuLanier ClamMD Author Type: Physician Filed: 08/19/2021 12:30 PM  Note Status: Signed Cosign: Cosign Not Required Encounter Date: 08/19/2021  Editor: HuLanier ClamMD (Physician)               Nice to see you again  Your pulmonary function tests today were largely normal.  There are subtle signs of asthma.  Since you have improved with the Advair I think asthma is the correct diagnosis.   To further improve your symptoms, I think is worth trying at a different inhaler as a third medicine.   Use  Trelegy 1 puff once a day.  This replaces Advair, stop Advair once you start Trelegy.  Rinse your mouth out after every use.   Send me a message in 2 to 3 weeks and let me know if the Trelegy is helping more than the Advair.  If so, I will [facility].  It looks like there is a pretty significant discount on the medicine to receive a 3-81-monthpply at a time via the mail pharmacy.   Return to clinic in 6 months or sooner as needed with Dr. HunSilas Flood    Orthostatic Vitals Recorded in This Encounter   08/19/2021  1207     Patient Position: Sitting  BP Location: Right Arm  Cuff Size: Large   Instructions    Return in about 6 months (around 02/19/2022).  Nice to see you again  Your pulmonary function tests today were largely normal.  There are subtle signs of asthma.  Since you have improved with the Advair I think asthma is the correct diagnosis.   To further improve your symptoms, I think is worth trying at a different inhaler as a third medicine.   Use Trelegy 1 puff once a day.  This replaces Advair, stop Advair once you start Trelegy.  Rinse your mouth out after every use.   Send me a message in 2 to 3 weeks and let me know if the Trelegy is helping more than the Advair.  If so, I will send  a prescription to [facility] which is the mail pharmacy.  It looks like there is a pretty significant discount on the medicine to receive a 9-monthsupply at a time via the mail pharmacy.   Return to clinic in 6 months or sooner as needed with Dr. HSilas Flood     Was appointment offered to patient (explain)?  She has a recall in for December of 2024   Reason for call: Mychart message:  The last two weeks I have started having to clear my thoart alot.  It seems to start around lunch time.  I am using the Wixela 500/50.  The other inhaler we tried made my lips and mouth blister up. CGaltand spoke with patient's husband,  TCoumba Kellison([facility], he states that she has a dry cough and has been using her albuterol nebulizer three times per day for the past 2 days.  She gets some relief from the congestion.  She wants to know if it is ok to use the neb solution 3 times per day. If so, her prescription will been to be changed for her to have enough solution.  She is using her Wixela inhaler as prscribed and has increased her lasix d/u 3 lb wt gain.  Tammy, Please advise.  Thank you.

## 2021-10-07 NOTE — Telephone Encounter (Signed)
Sometimes the dry powder can cause throat irritation please make sure that she is brushing rinsing gargling and been drinking water to see if this helps to offset the dry powder irritation.  If still unable to tolerate will need to call back and Dr. Silas Flood will have to decide if an alternative inhaler is indicated  Also if symptoms are happening after eating it could be reflux make sure she is taking her pantoprazole  Please contact office for sooner follow up if symptoms do not improve or worsen or seek emergency care

## 2021-11-02 ENCOUNTER — Telehealth: Payer: Self-pay | Admitting: Pulmonary Disease

## 2021-11-02 MED ORDER — FLUTICASONE-SALMETEROL 500-50 MCG/ACT IN AEPB: 1.0000 | INHALATION_SPRAY | Freq: Two times a day (BID) | RESPIRATORY_TRACT | 5 refills | Status: DC

## 2021-11-02 NOTE — Telephone Encounter (Signed)
Called and verified medication and pharmacy with patient. Nothing further needed

## 2021-11-02 NOTE — Telephone Encounter (Signed)
Patient called to request a refill for her Wixela medication.  She would like it sent to the [facility] in [place].  Please advise.

## 2021-11-21 ENCOUNTER — Encounter: Payer: Self-pay | Admitting: Pulmonary Disease

## 2021-11-21 NOTE — Telephone Encounter (Signed)
Mychart message sent by pt: [name] "Cindi"  P [facility] (supporting Lanier Clam, MD) 12 minutes ago (9:48 AM)    Today my Dentist noticed white spots on my soft palate.  He says he has never seen it before and wanted me to check with you and see if the Wixela may cause it.  I do rinse my mouth out after using the [facility] as you suggested. Thanks [employer] to provider of the day with Dr. Silas Flood being out of the office today. Dr. Shearon Stalls, please advise.

## 2021-11-21 NOTE — Telephone Encounter (Signed)
Probably mild thrush. Agree wixela related. If not having any symptoms can try gargling with alcohol based mouthwash to help get rid of it. Make sure she is gargling and not just rinsing. If symptoms are causing pain or bad taste in mouth we can try switching inhalers or consider treatment.

## 2021-11-24 DIAGNOSIS — G4733 Obstructive sleep apnea (adult) (pediatric): Secondary | ICD-10-CM | POA: Insufficient documentation

## 2022-01-02 ENCOUNTER — Other Ambulatory Visit: Payer: Self-pay

## 2022-01-02 ENCOUNTER — Emergency Department (HOSPITAL_COMMUNITY)
Admission: EM | Admit: 2022-01-02 | Discharge: 2022-01-03 | Disposition: A | Discharge status: Home / Self Care | Payer: Medicare PPO | Attending: Emergency Medicine | Admitting: Emergency Medicine

## 2022-01-02 ENCOUNTER — Encounter (HOSPITAL_COMMUNITY): Payer: Self-pay | Admitting: *Deleted

## 2022-01-02 DIAGNOSIS — J029 Acute pharyngitis, unspecified: Secondary | ICD-10-CM | POA: Diagnosis present

## 2022-01-02 DIAGNOSIS — Z91010 Allergy to peanuts: Secondary | ICD-10-CM | POA: Diagnosis not present

## 2022-01-02 HISTORY — DX: Encounter for other specified aftercare: Z51.89

## 2022-01-02 HISTORY — DX: Unspecified asthma, uncomplicated: J45.909

## 2022-01-02 HISTORY — DX: Unspecified transfusion reaction, initial encounter: T80.92XA

## 2022-01-02 NOTE — ED Triage Notes (Signed)
Pt with left ear pain and hurts to swallow.  Recent cold per husband. Denies any fevers.  Pt with dizziness.

## 2022-01-03 ENCOUNTER — Emergency Department (HOSPITAL_COMMUNITY): Payer: Medicare PPO

## 2022-01-03 LAB — CBC WITH DIFFERENTIAL/PLATELET
Abs Immature Granulocytes: 0.06 10*3/uL (ref 0.00–0.07)
Basophils Absolute: 0.1 10*3/uL (ref 0.0–0.1)
Basophils Relative: 0 %
Eosinophils Absolute: 0.5 10*3/uL (ref 0.0–0.5)
Eosinophils Relative: 4 %
HCT: 36.2 % (ref 36.0–46.0)
Hemoglobin: 12.2 g/dL (ref 12.0–15.0)
Immature Granulocytes: 1 %
Lymphocytes Relative: 21 %
Lymphs Abs: 2.6 10*3/uL (ref 0.7–4.0)
MCH: 28.4 pg (ref 26.0–34.0)
MCHC: 33.7 g/dL (ref 30.0–36.0)
MCV: 84.2 fL (ref 80.0–100.0)
Monocytes Absolute: 1.1 10*3/uL — ABNORMAL HIGH (ref 0.1–1.0)
Monocytes Relative: 9 %
Neutro Abs: 8.4 10*3/uL — ABNORMAL HIGH (ref 1.7–7.7)
Neutrophils Relative %: 65 %
Platelets: 273 10*3/uL (ref 150–400)
RBC: 4.3 MIL/uL (ref 3.87–5.11)
RDW: 14.8 % (ref 11.5–15.5)
WBC: 12.7 10*3/uL — ABNORMAL HIGH (ref 4.0–10.5)
nRBC: 0 % (ref 0.0–0.2)

## 2022-01-03 LAB — BASIC METABOLIC PANEL
Anion gap: 10 (ref 5–15)
BUN: 14 mg/dL (ref 8–23)
CO2: 24 mmol/L (ref 22–32)
Calcium: 9.6 mg/dL (ref 8.9–10.3)
Chloride: 103 mmol/L (ref 98–111)
Creatinine, Ser: 0.9 mg/dL (ref 0.44–1.00)
GFR, Estimated: >60 mL/min (ref >60)
Glucose, Bld: 161 mg/dL — ABNORMAL HIGH (ref 70–99)
Potassium: 3.4 mmol/L — ABNORMAL LOW (ref 3.5–5.1)
Sodium: 137 mmol/L (ref 135–145)

## 2022-01-03 MED ORDER — AMOXICILLIN-POT CLAVULANATE 875-125 MG PO TABS
1.0000 | ORAL_TABLET | Freq: Once | ORAL | Status: AC
  Administered 2022-01-03: 1 via ORAL
  Filled 2022-01-03: qty 1

## 2022-01-03 MED ORDER — IOHEXOL 300 MG/ML  SOLN
75.0000 mL | Freq: Once | INTRAMUSCULAR | Status: AC | PRN
  Administered 2022-01-03: 75 mL via INTRAVENOUS

## 2022-01-03 MED ORDER — PREDNISONE 10 MG (21) PO TBPK: ORAL_TABLET | ORAL | 0 refills | Status: DC

## 2022-01-03 MED ORDER — PREDNISONE 50 MG PO TABS
60.0000 mg | ORAL_TABLET | Freq: Once | ORAL | Status: AC
  Administered 2022-01-03: 60 mg via ORAL
  Filled 2022-01-03: qty 1

## 2022-01-03 MED ORDER — KETOROLAC TROMETHAMINE 30 MG/ML IJ SOLN
15.0000 mg | Freq: Once | INTRAMUSCULAR | Status: AC
  Administered 2022-01-03: 15 mg via INTRAVENOUS
  Filled 2022-01-03: qty 1

## 2022-01-03 MED ORDER — AMOXICILLIN-POT CLAVULANATE 875-125 MG PO TABS: 1.0000 | ORAL_TABLET | Freq: Two times a day (BID) | ORAL | 0 refills | Status: DC

## 2022-01-03 NOTE — ED Provider Notes (Signed)
[facility] EMERGENCY DEPARTMENT  Provider Note  CSN: [id number] Arrival date & time: 01/02/22 2250  History Chief Complaint  Patient presents with   Otalgia    [name] is a 66 y.o. female brought by husband for L ear pain she reports onset around 6pm, he states has been off and on for a couple weeks. She has had URI symptoms recently. She is holding her L face/neck. She reports pain with swallowing but is able to get food and liquids to go down 'eventually'. She denies any fever or drainage from ear tonight.    Home Medications Prior to Admission medications   Medication Sig Start Date End Date Taking? Authorizing Provider  amoxicillin-clavulanate (AUGMENTIN) 875-125 MG tablet Take 1 tablet by mouth every 12 (twelve) hours. 01/03/22  Yes Truddie Hidden, MD  predniSONE (STERAPRED UNI-PAK 21 TAB) 10 MG (21) TBPK tablet '10mg'$  Tabs, 6 day taper. Use as directed 01/03/22  Yes Truddie Hidden, MD  allopurinol (ZYLOPRIM) 300 MG tablet TAKE 1 TABLET BY MOUTH EVERY DAY Patient taking differently: Take 300 mg by mouth daily. 07/31/18   Bo Merino, MD  ALPRAZolam Duanne Moron) 0.25 MG tablet Take 0.25 mg by mouth 3 (three) times daily as needed. 05/24/20   [provider]  amLODipine (NORVASC) 5 MG tablet Take 1 tablet (5 mg total) by mouth daily. 05/09/21   Martinique, Peter M, MD  carvedilol (COREG) 6.25 MG tablet TAKE 1 TABLET BY MOUTH 2 TIMES DAILY WITH A MEAL. 07/28/21   Lenna Sciara, NP  Cholecalciferol (VITAMIN D3) 25 MCG (1000 UT) CAPS Take 1,000 Units by mouth daily.    [provider]  DULoxetine (CYMBALTA) 60 MG capsule TAKE 1 CAPSULE BY MOUTH EVERY DAY 08/25/21   Marcial Pacas, MD  fluticasone-salmeterol (ADVAIR) 500-50 MCG/ACT AEPB Inhale 1 puff into the lungs in the morning and at bedtime. 11/02/21   Hunsucker, Bonna Gains, MD  furosemide (LASIX) 20 MG tablet TAKE 1 TABLET BY MOUTH DAILY AND AN EXTRA TABLET OF DAYS OF WEIGHT GAIN OF 3 LBS OVERNIGHT OR 5 LBS IN 1  WEEK. 09/30/21   [place], Peter M, MD  KLOR-CON M20 20 MEQ tablet Take 20 mEq by mouth daily. 05/10/20   [provider]  loratadine (CLARITIN) 10 MG tablet Take 10 mg by mouth daily.    [provider]  losartan (COZAAR) 100 MG tablet Take 50 mg by mouth daily. 03/30/20   [provider]  meloxicam (MOBIC) 7.5 MG tablet Take 7.5 mg by mouth 3 (three) times daily as needed. 03/26/20   [provider]  metFORMIN (GLUCOPHAGE) 1000 MG tablet Take 1,000 mg by mouth 2 (two) times daily with a meal.    [provider]  methocarbamol (ROBAXIN) 500 MG tablet Take 500 mg by mouth daily. 04/11/20   [provider]  pantoprazole (PROTONIX) 40 MG tablet Take 40 mg by mouth 2 (two) times daily. 10/13/15   [provider]  rosuvastatin (CRESTOR) 10 MG tablet Take 5 mg by mouth daily. 04/29/20   [provider]  sertraline (ZOLOFT) 100 MG tablet Take 200 mg by mouth at bedtime.    [provider]     Allergies    Azathioprine, Gabapentin, Penicillins, Pregabalin, Aspirin, Lisinopril, Aloe, Fluoxetine, Hydromorphone, Lactose, Lactose intolerance (gi), Latex, Methotrexate derivatives, Methotrexate sodium, Morphine and related, Morphine sulfate, Neomycin, Other, and Prozac [fluoxetine hcl]   Review of Systems   Review of Systems Please see HPI for pertinent positives and  negatives  Physical Exam BP (!) 164/127 (BP Location: Right Wrist)   Pulse 81   Temp 98.3 F (36.8 C) (Oral)   Resp 20   Ht '5\' 3"'$  (1.6 m)   Wt 115.7 kg   SpO2 97%   BMI 45.17 kg/m   Physical Exam Vitals and nursing note reviewed.  Constitutional:      Appearance: Normal appearance.  HENT:     Head: Normocephalic and atraumatic.     Comments: Exquisitely tender to palpation of the entire L face/neck area, no definite mass or adenopathy but exam is limited by patient withdrawing to any touch    Right Ear: Tympanic membrane and ear canal normal.     Left  Ear: Tympanic membrane and ear canal normal.     Nose: Nose normal.     Mouth/Throat:     Mouth: Mucous membranes are moist.  Eyes:     Extraocular Movements: Extraocular movements intact.     Conjunctiva/sclera: Conjunctivae normal.  Cardiovascular:     Rate and Rhythm: Normal rate.  Pulmonary:     Effort: Pulmonary effort is normal.     Breath sounds: Normal breath sounds.  Abdominal:     General: Abdomen is flat.     Palpations: Abdomen is soft.     Tenderness: There is no abdominal tenderness.  Musculoskeletal:        General: No swelling. Normal range of motion.     Cervical back: Neck supple.  Skin:    General: Skin is warm and dry.  Neurological:     General: No focal deficit present.     Mental Status: She is alert.  Psychiatric:        Mood and Affect: Mood normal.     ED Results / Procedures / Treatments   EKG None  Procedures Procedures  Medications Ordered in the ED Medications  amoxicillin-clavulanate (AUGMENTIN) 875-125 MG per tablet 1 tablet (has no administration in time range)  predniSONE (DELTASONE) tablet 60 mg (has no administration in time range)  iohexol (OMNIPAQUE) 300 MG/ML solution 75 mL (75 mLs Intravenous Contrast Given 01/03/22 0127)  ketorolac (TORADOL) 30 MG/ML injection 15 mg (15 mg Intravenous Given 01/03/22 0153)    Initial Impression and Plan  Patient here with L ear pain, TM is normal. Concern for mastoiditis or other deep tissue infection however exam is limited by tenderness. Will check basic labs and send for CT to further evaluate.   ED Course   Clinical Course as of 01/03/22 0248  Tue Jan 03, 2022  0054 CBC with mild leukocytosis.  [CS]  0115 BMP unremarkable.  [CS]  0246 Patient's pain improved with Toradol. Able to examine her pharynx better now. She has some edema of her L tonsillar fossa, although she reports prior tonsillectomy. Will give a course of Abx and steroids for her pharyngitis. Recommend PCP follow up. RTED  for any other concerns.  [CS]    Clinical Course User Index [CS] Truddie Hidden, MD     MDM Rules/Calculators/A&P Medical Decision Making Problems Addressed: Pharyngitis, unspecified etiology: acute illness or injury  Amount and/or Complexity of Data Reviewed Labs: ordered. Decision-making details documented in ED Course. Radiology: ordered and independent interpretation performed. Decision-making details documented in ED Course.  Risk Prescription drug management.    Final Clinical Impression(s) / ED Diagnoses Final diagnoses:  Pharyngitis, unspecified etiology    Rx / DC Orders ED Discharge Orders          Ordered  amoxicillin-clavulanate (AUGMENTIN) 875-125 MG tablet  Every 12 hours        01/03/22 0247    predniSONE (STERAPRED UNI-PAK 21 TAB) 10 MG (21) TBPK tablet        01/03/22 0247             Truddie Hidden, MD 01/03/22 [phone]

## 2022-01-13 DIAGNOSIS — I129 Hypertensive chronic kidney disease with stage 1 through stage 4 chronic kidney disease, or unspecified chronic kidney disease: Secondary | ICD-10-CM | POA: Diagnosis not present

## 2022-01-13 DIAGNOSIS — H9202 Otalgia, left ear: Secondary | ICD-10-CM | POA: Diagnosis not present

## 2022-01-13 DIAGNOSIS — J029 Acute pharyngitis, unspecified: Secondary | ICD-10-CM | POA: Diagnosis not present

## 2022-01-13 DIAGNOSIS — E1129 Type 2 diabetes mellitus with other diabetic kidney complication: Secondary | ICD-10-CM | POA: Diagnosis not present

## 2022-01-16 ENCOUNTER — Other Ambulatory Visit: Payer: Self-pay | Admitting: Internal Medicine

## 2022-01-16 DIAGNOSIS — Z1231 Encounter for screening mammogram for malignant neoplasm of breast: Secondary | ICD-10-CM

## 2022-01-23 ENCOUNTER — Other Ambulatory Visit: Payer: Self-pay | Admitting: Nurse Practitioner

## 2022-02-01 DIAGNOSIS — H903 Sensorineural hearing loss, bilateral: Secondary | ICD-10-CM | POA: Diagnosis not present

## 2022-02-21 DIAGNOSIS — M26622 Arthralgia of left temporomandibular joint: Secondary | ICD-10-CM | POA: Diagnosis not present

## 2022-02-21 DIAGNOSIS — H903 Sensorineural hearing loss, bilateral: Secondary | ICD-10-CM | POA: Insufficient documentation

## 2022-02-21 DIAGNOSIS — H9202 Otalgia, left ear: Secondary | ICD-10-CM | POA: Diagnosis not present

## 2022-02-24 ENCOUNTER — Other Ambulatory Visit: Payer: Self-pay | Admitting: Neurology

## 2022-03-03 ENCOUNTER — Encounter: Payer: Self-pay | Admitting: Pulmonary Disease

## 2022-03-03 ENCOUNTER — Ambulatory Visit: Payer: Medicare PPO | Admitting: Pulmonary Disease

## 2022-03-03 VITALS — BP 128/70 | HR 79 | Ht 63.0 in | Wt 257.6 lb

## 2022-03-03 DIAGNOSIS — J4540 Moderate persistent asthma, uncomplicated: Secondary | ICD-10-CM

## 2022-03-03 DIAGNOSIS — E7800 Pure hypercholesterolemia, unspecified: Secondary | ICD-10-CM | POA: Diagnosis not present

## 2022-03-03 DIAGNOSIS — M8580 Other specified disorders of bone density and structure, unspecified site: Secondary | ICD-10-CM | POA: Diagnosis not present

## 2022-03-03 DIAGNOSIS — E1129 Type 2 diabetes mellitus with other diabetic kidney complication: Secondary | ICD-10-CM | POA: Diagnosis not present

## 2022-03-03 DIAGNOSIS — F411 Generalized anxiety disorder: Secondary | ICD-10-CM | POA: Diagnosis not present

## 2022-03-03 DIAGNOSIS — R7989 Other specified abnormal findings of blood chemistry: Secondary | ICD-10-CM | POA: Diagnosis not present

## 2022-03-03 MED ORDER — SPIRIVA RESPIMAT 1.25 MCG/ACT IN AERS: 2.0000 | INHALATION_SPRAY | Freq: Every day | RESPIRATORY_TRACT | 0 refills | Status: DC

## 2022-03-03 NOTE — Addendum Note (Signed)
Addended by: Monna Fam L on: 03/03/2022 02:23 PM   Modules accepted: Orders

## 2022-03-03 NOTE — Patient Instructions (Addendum)
Nice to see you again  [employer], I am glad it helped  Add Spiriva 2 puffs once a day.  See if this helps during the day with shortness of breath but also with a cough in the afternoons, early afternoon after lunch  Return to clinic in 6 months or sooner if needed with Dr. Silas Flood
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
## 2022-03-07 DIAGNOSIS — Z1211 Encounter for screening for malignant neoplasm of colon: Secondary | ICD-10-CM | POA: Diagnosis not present

## 2022-03-10 ENCOUNTER — Ambulatory Visit
Admission: RE | Admit: 2022-03-10 | Discharge: 2022-03-10 | Disposition: A | Discharge status: Home / Self Care | Payer: Medicare PPO | Source: Ambulatory Visit | Attending: Internal Medicine | Admitting: Internal Medicine

## 2022-03-10 DIAGNOSIS — I129 Hypertensive chronic kidney disease with stage 1 through stage 4 chronic kidney disease, or unspecified chronic kidney disease: Secondary | ICD-10-CM | POA: Diagnosis not present

## 2022-03-10 DIAGNOSIS — Z1231 Encounter for screening mammogram for malignant neoplasm of breast: Secondary | ICD-10-CM | POA: Diagnosis not present

## 2022-03-10 DIAGNOSIS — K219 Gastro-esophageal reflux disease without esophagitis: Secondary | ICD-10-CM | POA: Diagnosis not present

## 2022-03-10 DIAGNOSIS — F411 Generalized anxiety disorder: Secondary | ICD-10-CM | POA: Diagnosis not present

## 2022-03-10 DIAGNOSIS — E1129 Type 2 diabetes mellitus with other diabetic kidney complication: Secondary | ICD-10-CM | POA: Diagnosis not present

## 2022-03-10 DIAGNOSIS — M1A09X0 Idiopathic chronic gout, multiple sites, without tophus (tophi): Secondary | ICD-10-CM | POA: Diagnosis not present

## 2022-03-10 DIAGNOSIS — E1122 Type 2 diabetes mellitus with diabetic chronic kidney disease: Secondary | ICD-10-CM | POA: Diagnosis not present

## 2022-03-10 DIAGNOSIS — F331 Major depressive disorder, recurrent, moderate: Secondary | ICD-10-CM | POA: Diagnosis not present

## 2022-03-10 DIAGNOSIS — R82998 Other abnormal findings in urine: Secondary | ICD-10-CM | POA: Diagnosis not present

## 2022-03-10 DIAGNOSIS — G894 Chronic pain syndrome: Secondary | ICD-10-CM | POA: Diagnosis not present

## 2022-03-10 DIAGNOSIS — Z0000 Encounter for general adult medical examination without abnormal findings: Secondary | ICD-10-CM | POA: Diagnosis not present

## 2022-03-10 DIAGNOSIS — E7800 Pure hypercholesterolemia, unspecified: Secondary | ICD-10-CM | POA: Diagnosis not present

## 2022-03-22 ENCOUNTER — Other Ambulatory Visit: Payer: Self-pay | Admitting: Neurology

## 2022-03-27 ENCOUNTER — Encounter: Payer: Self-pay | Admitting: Pulmonary Disease

## 2022-03-27 ENCOUNTER — Other Ambulatory Visit: Payer: Self-pay | Admitting: Pulmonary Disease

## 2022-03-27 MED ORDER — SPIRIVA RESPIMAT 1.25 MCG/ACT IN AERS: 2.0000 | INHALATION_SPRAY | Freq: Every day | RESPIRATORY_TRACT | 5 refills | Status: DC

## 2022-05-03 ENCOUNTER — Other Ambulatory Visit: Payer: Self-pay | Admitting: Cardiology

## 2022-05-21 ENCOUNTER — Other Ambulatory Visit: Payer: Self-pay | Admitting: Pulmonary Disease

## 2022-07-23 ENCOUNTER — Other Ambulatory Visit: Payer: Self-pay | Admitting: Cardiology

## 2022-07-24 DIAGNOSIS — H16201 Unspecified keratoconjunctivitis, right eye: Secondary | ICD-10-CM | POA: Diagnosis not present

## 2022-07-31 DIAGNOSIS — H16201 Unspecified keratoconjunctivitis, right eye: Secondary | ICD-10-CM | POA: Diagnosis not present

## 2022-07-31 DIAGNOSIS — H16223 Keratoconjunctivitis sicca, not specified as Sjogren's, bilateral: Secondary | ICD-10-CM | POA: Diagnosis not present

## 2022-09-01 DIAGNOSIS — I129 Hypertensive chronic kidney disease with stage 1 through stage 4 chronic kidney disease, or unspecified chronic kidney disease: Secondary | ICD-10-CM | POA: Diagnosis not present

## 2022-09-01 DIAGNOSIS — E1129 Type 2 diabetes mellitus with other diabetic kidney complication: Secondary | ICD-10-CM | POA: Diagnosis not present

## 2022-09-01 DIAGNOSIS — E1122 Type 2 diabetes mellitus with diabetic chronic kidney disease: Secondary | ICD-10-CM | POA: Diagnosis not present

## 2022-09-01 DIAGNOSIS — F411 Generalized anxiety disorder: Secondary | ICD-10-CM | POA: Diagnosis not present

## 2022-09-01 DIAGNOSIS — Z85528 Personal history of other malignant neoplasm of kidney: Secondary | ICD-10-CM | POA: Diagnosis not present

## 2022-09-01 DIAGNOSIS — F331 Major depressive disorder, recurrent, moderate: Secondary | ICD-10-CM | POA: Diagnosis not present

## 2022-09-01 DIAGNOSIS — Z86012 Personal history of benign carcinoid tumor: Secondary | ICD-10-CM | POA: Diagnosis not present

## 2022-09-20 DIAGNOSIS — G4733 Obstructive sleep apnea (adult) (pediatric): Secondary | ICD-10-CM | POA: Diagnosis not present

## 2022-09-24 ENCOUNTER — Other Ambulatory Visit: Payer: Self-pay | Admitting: Neurology

## 2022-09-26 ENCOUNTER — Other Ambulatory Visit: Payer: Self-pay | Admitting: Neurology

## 2022-09-26 MED ORDER — DULOXETINE HCL 60 MG PO CPEP: 60.0000 mg | ORAL_CAPSULE | Freq: Every day | ORAL | 0 refills | Status: DC

## 2022-09-26 NOTE — Telephone Encounter (Signed)
Pt has scheduled her 1 yr f/u and is on wait list.   Pt is requesting a refill for DULoxetine (CYMBALTA) 60 MG capsule .  Pharmacy: [facility] [phone]

## 2022-09-26 NOTE — Telephone Encounter (Signed)
Dr. Terrace Arabia,  Is this interaction acceptable?   High Drug-Drug: DULoxetine and sertralineAdditive serotonergic effects may occur during coadministration of duloxetine and selective serotonin reuptake inhibitors (SSRIs), and the risk of developing serotonin syndrome may be increased. Details Override reason     DULoxetine (CYMBALTA) 60 MG capsule Prescription. Reordered. Long-term. sertraline (ZOLOFT) 100 MG tabletPatient reported medication. Active. Discontinue Medium Duplicate Therapy: sertraline, DULoxetineSSRIS AND SNRIS. No Abuse/Dependency Potential. Details Override reason     DULoxetine (CYMBALTA) 60 MG capsule, Daily Prescription. Reordered. Long-term. sertraline (ZOLOFT) 100 MG tablet, Daily at bedtimePatient reported medication. Active.

## 2022-10-28 ENCOUNTER — Other Ambulatory Visit: Payer: Self-pay | Admitting: Cardiology

## 2022-11-02 DIAGNOSIS — M79644 Pain in right finger(s): Secondary | ICD-10-CM | POA: Diagnosis not present

## 2022-11-10 ENCOUNTER — Encounter: Payer: Self-pay | Admitting: Internal Medicine
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
## 2022-11-13 ENCOUNTER — Encounter: Payer: Self-pay | Admitting: Family Medicine

## 2022-11-13 ENCOUNTER — Ambulatory Visit: Payer: Medicare PPO | Admitting: Family Medicine

## 2022-11-13 VITALS — BP 134/78 | HR 80 | Temp 98.2°F | Ht 61.0 in | Wt 261.4 lb

## 2022-11-13 DIAGNOSIS — I10 Essential (primary) hypertension: Secondary | ICD-10-CM | POA: Diagnosis not present

## 2022-11-13 DIAGNOSIS — F419 Anxiety disorder, unspecified: Secondary | ICD-10-CM | POA: Diagnosis not present

## 2022-11-13 DIAGNOSIS — E559 Vitamin D deficiency, unspecified: Secondary | ICD-10-CM | POA: Insufficient documentation

## 2022-11-13 DIAGNOSIS — E785 Hyperlipidemia, unspecified: Secondary | ICD-10-CM | POA: Diagnosis not present

## 2022-11-13 DIAGNOSIS — F32A Depression, unspecified: Secondary | ICD-10-CM

## 2022-11-13 DIAGNOSIS — Z23 Encounter for immunization: Secondary | ICD-10-CM

## 2022-11-13 DIAGNOSIS — K219 Gastro-esophageal reflux disease without esophagitis: Secondary | ICD-10-CM | POA: Insufficient documentation

## 2022-11-13 DIAGNOSIS — E119 Type 2 diabetes mellitus without complications: Secondary | ICD-10-CM | POA: Diagnosis not present

## 2022-11-13 DIAGNOSIS — Z8739 Personal history of other diseases of the musculoskeletal system and connective tissue: Secondary | ICD-10-CM | POA: Diagnosis not present

## 2022-11-13 DIAGNOSIS — Z7689 Persons encountering health services in other specified circumstances: Secondary | ICD-10-CM

## 2022-11-13 DIAGNOSIS — Z7984 Long term (current) use of oral hypoglycemic drugs: Secondary | ICD-10-CM

## 2022-11-13 LAB — CBC WITH DIFFERENTIAL/PLATELET
Basophils Absolute: 0.1 10*3/uL (ref 0.0–0.1)
Basophils Relative: 0.6 % (ref 0.0–3.0)
Eosinophils Absolute: 0.3 10*3/uL (ref 0.0–0.7)
Eosinophils Relative: 3.4 % (ref 0.0–5.0)
HCT: 37.9 % (ref 36.0–46.0)
Hemoglobin: 12.2 g/dL (ref 12.0–15.0)
Lymphocytes Relative: 37.1 % (ref 12.0–46.0)
Lymphs Abs: 3.2 10*3/uL (ref 0.7–4.0)
MCHC: 32.1 g/dL (ref 30.0–36.0)
MCV: 85.1 fL (ref 78.0–100.0)
Monocytes Absolute: 0.6 10*3/uL (ref 0.1–1.0)
Monocytes Relative: 6.7 % (ref 3.0–12.0)
Neutro Abs: 4.5 10*3/uL (ref 1.4–7.7)
Neutrophils Relative %: 52.2 % (ref 43.0–77.0)
Platelets: 299 10*3/uL (ref 150.0–400.0)
RBC: 4.45 Mil/uL (ref 3.87–5.11)
RDW: 15.6 % — ABNORMAL HIGH (ref 11.5–15.5)
WBC: 8.7 10*3/uL (ref 4.0–10.5)

## 2022-11-13 LAB — COMPREHENSIVE METABOLIC PANEL
ALT: 27 U/L (ref 0–35)
AST: 27 U/L (ref 0–37)
Albumin: 4 g/dL (ref 3.5–5.2)
Alkaline Phosphatase: 110 U/L (ref 39–117)
BUN: 12 mg/dL (ref 6–23)
CO2: 29 meq/L (ref 19–32)
Calcium: 9.7 mg/dL (ref 8.4–10.5)
Chloride: 103 meq/L (ref 96–112)
Creatinine, Ser: 0.78 mg/dL (ref 0.40–1.20)
GFR: 79.04 mL/min (ref >60.00)
Glucose, Bld: 91 mg/dL (ref 70–99)
Potassium: 4.2 meq/L (ref 3.5–5.1)
Sodium: 139 meq/L (ref 135–145)
Total Bilirubin: 0.4 mg/dL (ref 0.2–1.2)
Total Protein: 7.3 g/dL (ref 6.0–8.3)

## 2022-11-13 LAB — LIPID PANEL
Cholesterol: 146 mg/dL (ref 0–200)
HDL: 51 mg/dL (ref >39.00)
LDL Cholesterol: 67 mg/dL (ref 0–99)
NonHDL: 94.72
Total CHOL/HDL Ratio: 3
Triglycerides: 138 mg/dL (ref 0.0–149.0)
VLDL: 27.6 mg/dL (ref 0.0–40.0)

## 2022-11-13 LAB — HEMOGLOBIN A1C: Hgb A1c MFr Bld: 6.7 % — ABNORMAL HIGH (ref 4.6–6.5)

## 2022-11-13 LAB — MICROALBUMIN / CREATININE URINE RATIO
Creatinine,U: 50.5 mg/dL
Microalb Creat Ratio: 1.7 mg/g (ref 0.0–30.0)
Microalb, Ur: 0.9 mg/dL (ref 0.0–1.9)

## 2022-11-13 LAB — VITAMIN D 25 HYDROXY (VIT D DEFICIENCY, FRACTURES): VITD: 28.15 ng/mL — ABNORMAL LOW (ref 30.00–100.00)

## 2022-11-13 NOTE — Assessment & Plan Note (Signed)
Stable.  Continue  Pantoprazole 40mg daily

## 2022-11-13 NOTE — Assessment & Plan Note (Addendum)
Stable. Continue all blood pressure medications and needs to follow up with cardiology. Last appointment was 09/14/2021.

## 2022-11-13 NOTE — Assessment & Plan Note (Signed)
Controlled. Continue with Sertraline 100mg  daily. Effective. Denies SI and HI. Patient reports she has been taking Alprazolam 0.25mg  about once a month, but it was prescribed differently. PDMP reviewed, only prescribed once 10/18/2021 for 90 tablets with Dr. Rosanne Sack, [facility]. Patient has not completed a UDS or signed a controlled substance agreement. Will need to review records from [facility].

## 2022-11-13 NOTE — Assessment & Plan Note (Signed)
Unknown of stability of lipids due to not having most recent lipid panel results. Continue with Rosuvastatin  5mg  daily. Ordered lipid panel and CMP. Patient is not fasting and choose to go forward with lab draw.

## 2022-11-13 NOTE — Assessment & Plan Note (Signed)
Unknown if controlled since her last A1c is not available. Continue with Metformin 1,000 BID. Ordered A1c, microalbumin/creatinine urine ratio, CMP, and CBC. Foot exam completed. Ophthalmology exam-reported it was completed within the last year with [facility], will request records.

## 2022-11-13 NOTE — Patient Instructions (Addendum)
-  It was a pleasure to meet you today and look forward to taking care you.  -Continue all prescribed medications. -Ordered labs today. Office will call with lab results and you will see them on MyChart. -Influenza vaccine given today. Recommend  to go to your local pharmacy to obtain covid booster, Tdap, and RSV.  -Follow up in 6 months for a physical and needs an Annual Wellness Exam with [facility] over the phone.

## 2022-11-13 NOTE — Assessment & Plan Note (Signed)
Stable. Continue with Allopurinol 300mg  daily.

## 2022-11-13 NOTE — Assessment & Plan Note (Signed)
Patient is taking a supplement, but has a history of vitamin D deficiency.  Unsure of last level. Ordered Vitamin D level today.

## 2022-11-14 ENCOUNTER — Other Ambulatory Visit: Payer: Self-pay

## 2022-11-14 ENCOUNTER — Telehealth: Payer: Self-pay

## 2022-11-14 DIAGNOSIS — E559 Vitamin D deficiency, unspecified: Secondary | ICD-10-CM

## 2022-11-14 NOTE — Telephone Encounter (Signed)
-----   Message from Zandra Abts sent at 11/13/2022  5:40 PM EDT ----- Your A1c is stable, at goal of below 7, recommend to continue Metformin. Vitamin D is slightly low, recommend to increase your vitamin D supplement to 2,000IU daily, which should be 2 tablets at day. Recheck vitamin D in 3 months. (Dx: vitamin D deficiency) All other labs are stable. Continue with all prescribed medications.

## 2022-11-15 ENCOUNTER — Telehealth: Payer: Self-pay | Admitting: Cardiology

## 2022-11-15 ENCOUNTER — Ambulatory Visit: Payer: Medicare PPO

## 2022-11-15 DIAGNOSIS — Z0000 Encounter for general adult medical examination without abnormal findings: Secondary | ICD-10-CM

## 2022-11-15 NOTE — Telephone Encounter (Signed)
Called pt and pt stated that they had already gotten medication refilled.
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
## 2022-11-15 NOTE — Patient Instructions (Signed)
[name] , Thank you for taking time to come for your Medicare Wellness Visit. I appreciate your ongoing commitment to your health goals. Please review the following plan we discussed and let me know if I can assist you in the future.   Screening recommendations/referrals: Colonoscopy: up to date Mammogram: up to date Bone Density: Education provided Recommended yearly ophthalmology/optometry visit for glaucoma screening and checkup Recommended yearly dental visit for hygiene and checkup  Vaccinations: Influenza vaccine: up to date Pneumococcal vaccine: up to date Tdap vaccine: Education provided Shingles vaccine: up to date      Preventive Care 65 Years and Older, Female Preventive care refers to lifestyle choices and visits with your health care provider that can promote health and wellness. What does preventive care include? A yearly physical exam. This is also called an annual well check. Dental exams once or twice a year. Routine eye exams. Ask your health care provider how often you should have your eyes checked. Personal lifestyle choices, including: Daily care of your teeth and gums. Regular physical activity. Eating a healthy diet. Avoiding tobacco and drug use. Limiting alcohol use. Practicing safe sex. Taking low-dose aspirin every day. Taking vitamin and mineral supplements as recommended by your health care provider. What happens during an annual well check? The services and screenings done by your health care provider during your annual well check will depend on your age, overall health, lifestyle risk factors, and family history of disease. Counseling  Your health care provider may ask you questions about your: Alcohol use. Tobacco use. Drug use. Emotional well-being. Home and relationship well-being. Sexual activity. Eating habits. History of falls. Memory and ability to understand (cognition). Work and work Astronomer. Reproductive health. Screening   You may have the following tests or measurements: Height, weight, and BMI. Blood pressure. Lipid and cholesterol levels. These may be checked every 5 years, or more frequently if you are over 97 years old. Skin check. Lung cancer screening. You may have this screening every year starting at age 72 if you have a 30-pack-year history of smoking and currently smoke or have quit within the past 15 years. Fecal occult blood test (FOBT) of the stool. You may have this test every year starting at age 27. Flexible sigmoidoscopy or colonoscopy. You may have a sigmoidoscopy every 5 years or a colonoscopy every 10 years starting at age 86. Hepatitis C blood test. Hepatitis B blood test. Sexually transmitted disease (STD) testing. Diabetes screening. This is done by checking your blood sugar (glucose) after you have not eaten for a while (fasting). You may have this done every 1-3 years. Bone density scan. This is done to screen for osteoporosis. You may have this done starting at age 81. Mammogram. This may be done every 1-2 years. Talk to your health care provider about how often you should have regular mammograms. Talk with your health care provider about your test results, treatment options, and if necessary, the need for more tests. Vaccines  Your health care provider may recommend certain vaccines, such as: Influenza vaccine. This is recommended every year. Tetanus, diphtheria, and acellular pertussis (Tdap, Td) vaccine. You may need a Td booster every 10 years. Zoster vaccine. You may need this after age 87. Pneumococcal 13-valent conjugate (PCV13) vaccine. One dose is recommended after age 38. Pneumococcal polysaccharide (PPSV23) vaccine. One dose is recommended after age 30. Talk to your health care provider about which screenings and vaccines you need and how often you need them. This information is  not intended to replace advice given to you by your health care provider. Make sure you discuss  any questions you have with your health care provider. Document Released: 02/26/2015 Document Revised: 10/20/2015 Document Reviewed: 12/01/2014 Elsevier Interactive Patient Education  2017 [employer].  Fall Prevention in the Home Falls can cause injuries. They can happen to people of all ages. There are many things you can do to make your home safe and to help prevent falls. What can I do on the outside of my home? Regularly fix the edges of walkways and driveways and fix any cracks. Remove anything that might make you trip as you walk through a door, such as a raised step or threshold. Trim any bushes or trees on the path to your home. Use bright outdoor lighting. Clear any walking paths of anything that might make someone trip, such as rocks or tools. Regularly check to see if handrails are loose or broken. Make sure that both sides of any steps have handrails. Any raised decks and porches should have guardrails on the edges. Have any leaves, snow, or ice cleared regularly. Use sand or salt on walking paths during winter. Clean up any spills in your garage right away. This includes oil or grease spills. What can I do in the bathroom? Use night lights. Install grab bars by the toilet and in the tub and shower. Do not use towel bars as grab bars. Use non-skid mats or decals in the tub or shower. If you need to sit down in the shower, use a plastic, non-slip stool. Keep the floor dry. Clean up any water that spills on the floor as soon as it happens. Remove soap buildup in the tub or shower regularly. Attach bath mats securely with double-sided non-slip rug tape. Do not have throw rugs and other things on the floor that can make you trip. What can I do in the bedroom? Use night lights. Make sure that you have a light by your bed that is easy to reach. Do not use any sheets or blankets that are too big for your bed. They should not hang down onto the floor. Have a firm chair that has  side arms. You can use this for support while you get dressed. Do not have throw rugs and other things on the floor that can make you trip. What can I do in the kitchen? Clean up any spills right away. Avoid walking on wet floors. Keep items that you use a lot in easy-to-reach places. If you need to reach something above you, use a strong step stool that has a grab bar. Keep electrical cords out of the way. Do not use floor polish or wax that makes floors slippery. If you must use wax, use non-skid floor wax. Do not have throw rugs and other things on the floor that can make you trip. What can I do with my stairs? Do not leave any items on the stairs. Make sure that there are handrails on both sides of the stairs and use them. Fix handrails that are broken or loose. Make sure that handrails are as long as the stairways. Check any carpeting to make sure that it is firmly attached to the stairs. Fix any carpet that is loose or worn. Avoid having throw rugs at the top or bottom of the stairs. If you do have throw rugs, attach them to the floor with carpet tape. Make sure that you have a light switch at the top of the  stairs and the bottom of the stairs. If you do not have them, ask someone to add them for you. What else can I do to help prevent falls? Wear shoes that: Do not have high heels. Have rubber bottoms. Are comfortable and fit you well. Are closed at the toe. Do not wear sandals. If you use a stepladder: Make sure that it is fully opened. Do not climb a closed stepladder. Make sure that both sides of the stepladder are locked into place. Ask someone to hold it for you, if possible. Clearly mark and make sure that you can see: Any grab bars or handrails. First and last steps. Where the edge of each step is. Use tools that help you move around (mobility aids) if they are needed. These include: Canes. Walkers. Scooters. Crutches. Turn on the lights when you go into a dark area.  Replace any light bulbs as soon as they burn out. Set up your furniture so you have a clear path. Avoid moving your furniture around. If any of your floors are uneven, fix them. If there are any pets around you, be aware of where they are. Review your medicines with your doctor. Some medicines can make you feel dizzy. This can increase your chance of falling. Ask your doctor what other things that you can do to help prevent falls. This information is not intended to replace advice given to you by your health care provider. Make sure you discuss any questions you have with your health care provider. Document Released: 11/26/2008 Document Revised: 07/08/2015 Document Reviewed: 03/06/2014 Elsevier Interactive Patient Education  2017 [employer].

## 2022-11-15 NOTE — Telephone Encounter (Signed)
*  STAT* If patient is at the pharmacy, call can be transferred to refill team.   1. Which medications need to be refilled? (please list name of each medication and dose if known) losartan (COZAAR) 100 MG tablet   2. Which pharmacy/location (including street and city if local pharmacy) is medication to be sent to?  [id number] - [place], [place] - [address]      3. Do they need a 30 day or 90 day supply? 90 day

## 2022-11-21 ENCOUNTER — Other Ambulatory Visit: Payer: Self-pay | Admitting: Cardiology

## 2022-11-23 DIAGNOSIS — M25531 Pain in right wrist: Secondary | ICD-10-CM | POA: Diagnosis not present

## 2022-11-24 ENCOUNTER — Other Ambulatory Visit: Payer: Self-pay | Admitting: *Deleted

## 2022-11-24 ENCOUNTER — Telehealth: Payer: Self-pay | Admitting: Cardiology

## 2022-11-24 ENCOUNTER — Telehealth: Payer: Self-pay | Admitting: Family Medicine

## 2022-11-24 MED ORDER — LOSARTAN POTASSIUM 100 MG PO TABS: 50.0000 mg | ORAL_TABLET | Freq: Every day | ORAL | 0 refills | Status: DC

## 2022-11-24 NOTE — Telephone Encounter (Signed)
*  STAT* If patient is at the pharmacy, call can be transferred to refill team.   1. Which medications need to be refilled? (please list name of each medication and dose if known)   losartan (COZAAR) 100 MG tablet     4. Which pharmacy/location (including street and city if local pharmacy) is medication to be sent to? [id number] - [place], [place] - [address]     5. Do they need a 30 day or 90 day supply? 90

## 2022-11-24 NOTE — Telephone Encounter (Signed)
Prescription Request  11/24/2022  LOV: 11/13/2022  What is the name of the medication or equipment? allopurinol (ZYLOPRIM) 300 MG tablet  previously filled by another provider, pt will be running out soon  Have you contacted your pharmacy to request a refill? No   Which pharmacy would you like this sent to?   [id number] - [place], [place] - [address] [place]  [id number] Phone: [phone] Fax: [phone]    Patient notified that their request is being sent to the clinical staff for review and that they should receive a response within 2 business days.   Please advise at [facility] [phone]

## 2022-11-26 ENCOUNTER — Encounter: Payer: Self-pay | Admitting: Family Medicine

## 2022-11-28 ENCOUNTER — Other Ambulatory Visit: Payer: Self-pay | Admitting: Cardiology

## 2022-11-28 MED ORDER — ALLOPURINOL 300 MG PO TABS: 300.0000 mg | ORAL_TABLET | Freq: Every day | ORAL | 2 refills | Status: DC

## 2022-11-28 NOTE — Telephone Encounter (Signed)
Refill sent to pharmacy.   

## 2022-12-03 ENCOUNTER — Other Ambulatory Visit: Payer: Self-pay | Admitting: Pulmonary Disease

## 2022-12-05 ENCOUNTER — Encounter: Payer: Self-pay | Admitting: Internal Medicine

## 2022-12-07 DIAGNOSIS — M13841 Other specified arthritis, right hand: Secondary | ICD-10-CM | POA: Diagnosis not present

## 2022-12-12 ENCOUNTER — Ambulatory Visit: Payer: Medicare PPO | Admitting: Neurology

## 2022-12-12 ENCOUNTER — Encounter: Payer: Self-pay | Admitting: Neurology

## 2022-12-12 VITALS — BP 135/83 | HR 76 | Ht 61.0 in | Wt 263.0 lb

## 2022-12-12 DIAGNOSIS — R52 Pain, unspecified: Secondary | ICD-10-CM

## 2022-12-12 MED ORDER — DULOXETINE HCL 60 MG PO CPEP: 60.0000 mg | ORAL_CAPSULE | Freq: Every day | ORAL | 3 refills | Status: DC
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
## 2022-12-13 ENCOUNTER — Other Ambulatory Visit: Payer: Self-pay | Admitting: Cardiology

## 2022-12-17 ENCOUNTER — Other Ambulatory Visit: Payer: Self-pay | Admitting: Medical Genetics

## 2022-12-17 DIAGNOSIS — Z006 Encounter for examination for normal comparison and control in clinical research program: Secondary | ICD-10-CM

## 2022-12-26 ENCOUNTER — Encounter: Payer: Self-pay | Admitting: Family Medicine

## 2022-12-27 NOTE — Telephone Encounter (Signed)
Pt's Spouse called to say Pt only has 2-3 pills left, and he really needs her to keep up with this medication.  Please send refill, ASAP!!  [id number] - [place], [place] - [address] Phone: [phone]  Fax: [phone]

## 2022-12-28 MED ORDER — SERTRALINE HCL 100 MG PO TABS: 200.0000 mg | ORAL_TABLET | Freq: Every day | ORAL | 1 refills | Status: DC

## 2023-01-01 DIAGNOSIS — M1811 Unilateral primary osteoarthritis of first carpometacarpal joint, right hand: Secondary | ICD-10-CM | POA: Diagnosis not present
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
## 2023-01-02 ENCOUNTER — Encounter: Payer: Self-pay | Admitting: Cardiology

## 2023-01-02 ENCOUNTER — Ambulatory Visit: Payer: Medicare PPO | Attending: Cardiology | Admitting: Cardiology

## 2023-01-02 VITALS — BP 126/64 | HR 79 | Ht 61.0 in | Wt 267.0 lb

## 2023-01-02 DIAGNOSIS — I10 Essential (primary) hypertension: Secondary | ICD-10-CM

## 2023-01-02 DIAGNOSIS — I2510 Atherosclerotic heart disease of native coronary artery without angina pectoris: Secondary | ICD-10-CM | POA: Diagnosis not present

## 2023-01-02 DIAGNOSIS — R0609 Other forms of dyspnea: Secondary | ICD-10-CM | POA: Diagnosis not present

## 2023-01-02 DIAGNOSIS — G4733 Obstructive sleep apnea (adult) (pediatric): Secondary | ICD-10-CM | POA: Diagnosis not present

## 2023-01-02 NOTE — Patient Instructions (Signed)
 Medication Instructions:  Continue same medications *If you need a refill on your cardiac medications before your next appointment, please call your pharmacy*   Lab Work: None ordered   Testing/Procedures: None ordered   Follow-Up: At [facility], you and your health needs are our priority.  As part of our continuing mission to provide you with exceptional heart care, we have created designated Provider Care Teams.  These Care Teams include your primary Cardiologist (physician) and Advanced Practice Providers (APPs -  Physician Assistants and Nurse Practitioners) who all work together to provide you with the care you need, when you need it.  We recommend signing up for the patient portal called "MyChart".  Sign up information is provided on this After Visit Summary.  MyChart is used to connect with patients for Virtual Visits (Telemedicine).  Patients are able to view lab/test results, encounter notes, upcoming appointments, etc.  Non-urgent messages can be sent to your provider as well.   To learn more about what you can do with MyChart, go to [email or web address].    Your next appointment:  1 year  Call in July to schedule Nov appointment    Provider:  Dr.Jordan

## 2023-01-05 ENCOUNTER — Telehealth: Payer: Medicare PPO | Admitting: Family Medicine

## 2023-01-10 ENCOUNTER — Other Ambulatory Visit: Payer: Self-pay | Admitting: Cardiology

## 2023-01-14 ENCOUNTER — Other Ambulatory Visit: Payer: Self-pay | Admitting: Family Medicine

## 2023-01-15 MED ORDER — ROSUVASTATIN CALCIUM 10 MG PO TABS: 5.0000 mg | ORAL_TABLET | Freq: Every day | ORAL | 0 refills | Status: DC

## 2023-01-15 MED ORDER — METFORMIN HCL 1000 MG PO TABS: 1000.0000 mg | ORAL_TABLET | Freq: Two times a day (BID) | ORAL | 0 refills | Status: DC

## 2023-01-18 ENCOUNTER — Other Ambulatory Visit: Payer: Self-pay | Admitting: Cardiology

## 2023-01-19 DIAGNOSIS — M13841 Other specified arthritis, right hand: Secondary | ICD-10-CM | POA: Diagnosis not present

## 2023-01-21 ENCOUNTER — Other Ambulatory Visit: Payer: Self-pay | Admitting: Cardiology

## 2023-02-02 ENCOUNTER — Encounter: Payer: Self-pay | Admitting: Family Medicine

## 2023-02-05 ENCOUNTER — Other Ambulatory Visit: Payer: Self-pay | Admitting: Family Medicine

## 2023-02-05 MED ORDER — KLOR-CON M20 20 MEQ PO TBCR: 20.0000 meq | EXTENDED_RELEASE_TABLET | Freq: Every day | ORAL | 1 refills | Status: DC

## 2023-02-05 NOTE — Telephone Encounter (Unsigned)
Copied from CRM [id number]. Topic: Clinical - Medication Refill >> Feb 05, 2023  1:54 PM Efraim Kaufmann C wrote: Most Recent Primary Care Visit:  Provider: Laurey Arrow  Department: [facility]  Visit Type: MEDICARE AWV, SEQUENTIAL  Date: 11/15/2022  Medication: KLOR-CON M20 20 MEQ tablet  Has the patient contacted their pharmacy? Yes (Agent: If no, request that the patient contact the pharmacy for the refill. If patient does not wish to contact the pharmacy document the reason why and proceed with request.) (Agent: If yes, when and what did the pharmacy advise?)  Is this the correct pharmacy for this prescription? Yes If no, delete pharmacy and type the correct one.  This is the patient's preferred pharmacy:  [id number] - [place], [place] - [address] [place] [address] Phone: [phone] Fax: [phone]   Has the prescription been filled recently? No  Is the patient out of the medication? No  Has the patient been seen for an appointment in the last year OR does the patient have an upcoming appointment? Yes  Can we respond through MyChart? Yes  Agent: Please be advised that Rx refills may take up to 3 business days. We ask that you follow-up with your pharmacy.

## 2023-02-15 ENCOUNTER — Other Ambulatory Visit: Payer: Medicare PPO

## 2023-02-15 DIAGNOSIS — E559 Vitamin D deficiency, unspecified: Secondary | ICD-10-CM | POA: Diagnosis not present

## 2023-02-15 LAB — VITAMIN D 25 HYDROXY (VIT D DEFICIENCY, FRACTURES): VITD: 30.15 ng/mL (ref 30.00–100.00)

## 2023-02-20 DIAGNOSIS — M79641 Pain in right hand: Secondary | ICD-10-CM | POA: Diagnosis not present

## 2023-02-20 DIAGNOSIS — M13841 Other specified arthritis, right hand: Secondary | ICD-10-CM | POA: Diagnosis not present

## 2023-02-22 ENCOUNTER — Encounter: Payer: Self-pay | Admitting: Family Medicine

## 2023-02-26 ENCOUNTER — Other Ambulatory Visit: Payer: Self-pay | Admitting: Cardiology

## 2023-02-26 MED ORDER — AMLODIPINE BESYLATE 5 MG PO TABS: 5.0000 mg | ORAL_TABLET | Freq: Every day | ORAL | 3 refills | Status: DC

## 2023-02-26 NOTE — Telephone Encounter (Signed)
 [facility]ill [facility] to [facility] per patient's request.

## 2023-02-27 DIAGNOSIS — M79641 Pain in right hand: Secondary | ICD-10-CM | POA: Diagnosis not present

## 2023-03-02 ENCOUNTER — Other Ambulatory Visit: Payer: Self-pay | Admitting: Family

## 2023-03-02 ENCOUNTER — Other Ambulatory Visit: Payer: Self-pay | Admitting: Family Medicine

## 2023-03-06 DIAGNOSIS — M79641 Pain in right hand: Secondary | ICD-10-CM | POA: Diagnosis not present

## 2023-03-12 ENCOUNTER — Encounter: Payer: Self-pay | Admitting: Family Medicine

## 2023-03-12 ENCOUNTER — Other Ambulatory Visit: Payer: Self-pay | Admitting: Family Medicine

## 2023-03-12 DIAGNOSIS — Z1231 Encounter for screening mammogram for malignant neoplasm of breast: Secondary | ICD-10-CM

## 2023-03-13 DIAGNOSIS — M79641 Pain in right hand: Secondary | ICD-10-CM | POA: Diagnosis not present

## 2023-03-19 ENCOUNTER — Ambulatory Visit: Payer: Medicare PPO | Admitting: Pulmonary Disease

## 2023-03-19 ENCOUNTER — Encounter: Payer: Self-pay | Admitting: Family Medicine

## 2023-03-19 ENCOUNTER — Encounter: Payer: Self-pay | Admitting: Pulmonary Disease

## 2023-03-19 VITALS — BP 124/72 | HR 98 | Ht 62.0 in | Wt 260.8 lb

## 2023-03-19 DIAGNOSIS — R053 Chronic cough: Secondary | ICD-10-CM | POA: Diagnosis not present

## 2023-03-19 DIAGNOSIS — R0600 Dyspnea, unspecified: Secondary | ICD-10-CM | POA: Diagnosis not present

## 2023-03-19 DIAGNOSIS — J45909 Unspecified asthma, uncomplicated: Secondary | ICD-10-CM | POA: Diagnosis not present

## 2023-03-19 MED ORDER — AIRSUPRA 90-80 MCG/ACT IN AERO: 2.0000 | INHALATION_SPRAY | RESPIRATORY_TRACT | 11 refills | Status: DC | PRN
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
## 2023-03-19 NOTE — Patient Instructions (Signed)
Nice to see you again  Continue Advair as you are  I prescribed Airsupra, use 2 puffs midday.  See if this gets you through the middle of the day it helps your cough throughout the day.  Rinse your mouth out after using.  If Paulene Floor is too expensive or does not effective please send me a message and let me know.  In that case we will go to Plan B  Plan B-stop Advair and use Breztri 2 puffs in the morning and 2 puffs in the evening, rinse your mouth out with water after every use.  Return to clinic in 3 months or sooner as needed with Dr. Judeth Horn

## 2023-03-20 ENCOUNTER — Telehealth: Payer: Self-pay

## 2023-03-20 ENCOUNTER — Other Ambulatory Visit (HOSPITAL_COMMUNITY): Payer: Self-pay

## 2023-03-20 ENCOUNTER — Encounter: Payer: Self-pay | Admitting: Internal Medicine

## 2023-03-20 NOTE — Telephone Encounter (Signed)
 Pharmacy Patient Advocate Encounter   Received notification from CoverMyMeds that prior authorization for Airsupra  90-80MCG/ACT aerosol is required/requested.   Insurance verification completed.   The patient is insured through [place] .   Per test claim: PA required; PA submitted to above mentioned insurance via CoverMyMeds Key/confirmation #/EOC [id number] Status is pending

## 2023-03-22 ENCOUNTER — Encounter: Payer: Self-pay | Admitting: Pulmonary Disease

## 2023-03-23 DIAGNOSIS — M13841 Other specified arthritis, right hand: Secondary | ICD-10-CM | POA: Diagnosis not present

## 2023-03-23 DIAGNOSIS — M79641 Pain in right hand: Secondary | ICD-10-CM | POA: Diagnosis not present

## 2023-03-23 NOTE — Telephone Encounter (Signed)
 Mychart sent by pt:  [name] (supporting Donnice JONELLE Beals, MD)Yesterday (11:49 AM)    Our insurance said it was denied and to have you could fill out a form saying the I need the drug as the other two are not working long enough and they would ok it.   Hate to be a pain but,   Thanks for all you do.    Pt is referring to the Airsupra  inhaler. Routing to prior auth for them to review this to determine if a prior auth might be required.

## 2023-03-23 NOTE — Telephone Encounter (Signed)
 Dr. Marygrace Snellen, please see below message and advise. Would you like to appeal?

## 2023-03-23 NOTE — Telephone Encounter (Signed)
 Pharmacy Patient Advocate Encounter  Received notification from [employer] that Prior Authorization for Airsupra  90-80MCG/ACT aerosol has been DENIED.  Full denial letter will be uploaded to the media tab. See denial reason below.  Medial criteria not met.  The unmet criteria are: You have tried or cannot use at least one short-acting beta-2-receptor agonist (SABA) (for example: Ventolin  HFA, albuterol  HFA, etc.).   PA #/Case ID/Reference #: [id number]

## 2023-03-24 MED ORDER — ALBUTEROL SULFATE HFA 108 (90 BASE) MCG/ACT IN AERS: 2.0000 | INHALATION_SPRAY | Freq: Every day | RESPIRATORY_TRACT | 6 refills | Status: AC

## 2023-03-24 NOTE — Telephone Encounter (Signed)
 Prescribed albuterol  - can try midday if fails then will appeal.

## 2023-03-26 ENCOUNTER — Other Ambulatory Visit: Payer: Self-pay | Admitting: Family Medicine

## 2023-03-26 NOTE — Telephone Encounter (Signed)
 Please refer to encounter from 03-20-2023 regarding Airsupra  prior authorization and it's denial.

## 2023-03-26 NOTE — Telephone Encounter (Signed)
 Pt is aware and voiced her understanding.  Nothing further needed.

## 2023-03-27 ENCOUNTER — Ambulatory Visit
Admission: RE | Admit: 2023-03-27 | Discharge: 2023-03-27 | Disposition: A | Discharge status: Home / Self Care | Payer: Medicare PPO | Source: Ambulatory Visit | Attending: Family Medicine | Admitting: Family Medicine

## 2023-03-27 DIAGNOSIS — Z1231 Encounter for screening mammogram for malignant neoplasm of breast: Secondary | ICD-10-CM

## 2023-03-29 ENCOUNTER — Encounter: Payer: Self-pay | Admitting: Family Medicine

## 2023-04-12 ENCOUNTER — Other Ambulatory Visit: Payer: Self-pay | Admitting: Family Medicine

## 2023-04-12 MED ORDER — KLOR-CON M20 20 MEQ PO TBCR: 20.0000 meq | EXTENDED_RELEASE_TABLET | Freq: Every day | ORAL | 1 refills | Status: DC

## 2023-04-12 NOTE — Telephone Encounter (Signed)
 Copied from CRM [id number]. Topic: Clinical - Medication Refill >> Apr 12, 2023 10:27 AM Fredrich Romans wrote: Most Recent Primary Care Visit:  Provider: [facility]  Department: [facility]  Visit Type: LAB  Date: 02/15/2023  Medication: KLOR-CON M20 20 MEQ tablet  Has the patient contacted their pharmacy? Yes (Agent: If no, request that the patient contact the pharmacy for the refill. If patient does not wish to contact the pharmacy document the reason why and proceed with request.) (Agent: If yes, when and what did the pharmacy advise?)  Is this the correct pharmacy for this prescription? Yes If no, delete pharmacy and type the correct one.  This is the patient's preferred pharmacy:  [id number] - [place], [place] - [address] [place] [address] Phone: [phone] Fax: [phone]   Has the prescription been filled recently? Yes  Is the patient out of the medication? Yes  Has the patient been seen for an appointment in the last year OR does the patient have an upcoming appointment? Yes  Can we respond through MyChart? Yes  Agent: Please be advised that Rx refills may take up to 3 business days. We ask that you follow-up with your pharmacy.

## 2023-04-12 NOTE — Telephone Encounter (Signed)
 Sent to [facility] by mistake

## 2023-04-15 ENCOUNTER — Other Ambulatory Visit: Payer: Self-pay | Admitting: Family Medicine

## 2023-04-16 ENCOUNTER — Other Ambulatory Visit: Payer: Self-pay | Admitting: Family Medicine

## 2023-04-18 ENCOUNTER — Other Ambulatory Visit: Payer: Self-pay | Admitting: Pulmonary Disease

## 2023-04-30 DIAGNOSIS — S52502A Unspecified fracture of the lower end of left radius, initial encounter for closed fracture: Secondary | ICD-10-CM | POA: Diagnosis not present

## 2023-05-03 DIAGNOSIS — K219 Gastro-esophageal reflux disease without esophagitis: Secondary | ICD-10-CM | POA: Diagnosis not present

## 2023-05-03 DIAGNOSIS — K909 Intestinal malabsorption, unspecified: Secondary | ICD-10-CM | POA: Diagnosis not present

## 2023-05-03 DIAGNOSIS — Z860101 Personal history of adenomatous and serrated colon polyps: Secondary | ICD-10-CM | POA: Diagnosis not present

## 2023-05-04 ENCOUNTER — Encounter: Payer: Self-pay | Admitting: Internal Medicine

## 2023-05-04 DIAGNOSIS — M1811 Unilateral primary osteoarthritis of first carpometacarpal joint, right hand: Secondary | ICD-10-CM | POA: Diagnosis not present

## 2023-05-11 ENCOUNTER — Ambulatory Visit (INDEPENDENT_AMBULATORY_CARE_PROVIDER_SITE_OTHER): Payer: Medicare PPO | Admitting: Family Medicine

## 2023-05-11 VITALS — BP 138/80 | HR 79 | Temp 98.4°F | Ht 62.0 in | Wt 262.0 lb

## 2023-05-11 DIAGNOSIS — R599 Enlarged lymph nodes, unspecified: Secondary | ICD-10-CM | POA: Diagnosis not present

## 2023-05-11 DIAGNOSIS — I10 Essential (primary) hypertension: Secondary | ICD-10-CM | POA: Diagnosis not present

## 2023-05-11 DIAGNOSIS — D649 Anemia, unspecified: Secondary | ICD-10-CM

## 2023-05-11 DIAGNOSIS — Z0001 Encounter for general adult medical examination with abnormal findings: Secondary | ICD-10-CM | POA: Diagnosis not present

## 2023-05-11 DIAGNOSIS — Z7984 Long term (current) use of oral hypoglycemic drugs: Secondary | ICD-10-CM

## 2023-05-11 DIAGNOSIS — R748 Abnormal levels of other serum enzymes: Secondary | ICD-10-CM | POA: Diagnosis not present

## 2023-05-11 DIAGNOSIS — D638 Anemia in other chronic diseases classified elsewhere: Secondary | ICD-10-CM | POA: Diagnosis not present

## 2023-05-11 DIAGNOSIS — R160 Hepatomegaly, not elsewhere classified: Secondary | ICD-10-CM

## 2023-05-11 DIAGNOSIS — E785 Hyperlipidemia, unspecified: Secondary | ICD-10-CM

## 2023-05-11 DIAGNOSIS — E559 Vitamin D deficiency, unspecified: Secondary | ICD-10-CM

## 2023-05-11 DIAGNOSIS — K119 Disease of salivary gland, unspecified: Secondary | ICD-10-CM

## 2023-05-11 DIAGNOSIS — E119 Type 2 diabetes mellitus without complications: Secondary | ICD-10-CM

## 2023-05-11 DIAGNOSIS — K7460 Unspecified cirrhosis of liver: Secondary | ICD-10-CM

## 2023-05-11 DIAGNOSIS — Z0000 Encounter for general adult medical examination without abnormal findings: Secondary | ICD-10-CM
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
## 2023-05-11 NOTE — Patient Instructions (Addendum)
-  It was great to see you today and I hope you enjoy your upcoming camping trip.  -Physical exam completed today.  -Ordered CT soft tissue neck with contrast for swelling close to the left ear and for the abnormal findings from a previous CT scan performed in November 2023. Please call back to the office or send a MyChart message if you do not hear back from  -Ordered labs. Please make an appointment to have labs drawn when fasting.  -Follow up in 6 months for chronic management.

## 2023-05-14 ENCOUNTER — Other Ambulatory Visit (INDEPENDENT_AMBULATORY_CARE_PROVIDER_SITE_OTHER)

## 2023-05-14 DIAGNOSIS — E785 Hyperlipidemia, unspecified: Secondary | ICD-10-CM | POA: Diagnosis not present

## 2023-05-14 DIAGNOSIS — E559 Vitamin D deficiency, unspecified: Secondary | ICD-10-CM

## 2023-05-14 DIAGNOSIS — D638 Anemia in other chronic diseases classified elsewhere: Secondary | ICD-10-CM | POA: Diagnosis not present

## 2023-05-14 DIAGNOSIS — E119 Type 2 diabetes mellitus without complications: Secondary | ICD-10-CM

## 2023-05-14 DIAGNOSIS — R599 Enlarged lymph nodes, unspecified: Secondary | ICD-10-CM | POA: Diagnosis not present

## 2023-05-14 DIAGNOSIS — I10 Essential (primary) hypertension: Secondary | ICD-10-CM

## 2023-05-14 LAB — LIPID PANEL
Cholesterol: 130 mg/dL (ref 0–200)
HDL: 47.7 mg/dL (ref >39.00)
LDL Cholesterol: 55 mg/dL (ref 0–99)
NonHDL: 82.19
Total CHOL/HDL Ratio: 3
Triglycerides: 138 mg/dL (ref 0.0–149.0)
VLDL: 27.6 mg/dL (ref 0.0–40.0)

## 2023-05-14 LAB — COMPREHENSIVE METABOLIC PANEL WITH GFR
ALT: 22 U/L (ref 0–35)
AST: 20 U/L (ref 0–37)
Albumin: 4 g/dL (ref 3.5–5.2)
Alkaline Phosphatase: 128 U/L — ABNORMAL HIGH (ref 39–117)
BUN: 16 mg/dL (ref 6–23)
CO2: 26 meq/L (ref 19–32)
Calcium: 9.4 mg/dL (ref 8.4–10.5)
Chloride: 105 meq/L (ref 96–112)
Creatinine, Ser: 0.95 mg/dL (ref 0.40–1.20)
GFR: 62.17 mL/min (ref >60.00)
Glucose, Bld: 116 mg/dL — ABNORMAL HIGH (ref 70–99)
Potassium: 3.9 meq/L (ref 3.5–5.1)
Sodium: 140 meq/L (ref 135–145)
Total Bilirubin: 0.3 mg/dL (ref 0.2–1.2)
Total Protein: 7.3 g/dL (ref 6.0–8.3)

## 2023-05-14 LAB — CBC WITH DIFFERENTIAL/PLATELET
Basophils Absolute: 0.1 10*3/uL (ref 0.0–0.1)
Basophils Relative: 0.9 % (ref 0.0–3.0)
Eosinophils Absolute: 0.5 10*3/uL (ref 0.0–0.7)
Eosinophils Relative: 5.5 % — ABNORMAL HIGH (ref 0.0–5.0)
HCT: 34.8 % — ABNORMAL LOW (ref 36.0–46.0)
Hemoglobin: 11.8 g/dL — ABNORMAL LOW (ref 12.0–15.0)
Lymphocytes Relative: 33.3 % (ref 12.0–46.0)
Lymphs Abs: 2.9 10*3/uL (ref 0.7–4.0)
MCHC: 33.9 g/dL (ref 30.0–36.0)
MCV: 83.7 fl (ref 78.0–100.0)
Monocytes Absolute: 0.5 10*3/uL (ref 0.1–1.0)
Monocytes Relative: 5.9 % (ref 3.0–12.0)
Neutro Abs: 4.8 10*3/uL (ref 1.4–7.7)
Neutrophils Relative %: 54.4 % (ref 43.0–77.0)
Platelets: 276 10*3/uL (ref 150.0–400.0)
RBC: 4.16 Mil/uL (ref 3.87–5.11)
RDW: 15.4 % (ref 11.5–15.5)
WBC: 8.8 10*3/uL (ref 4.0–10.5)

## 2023-05-14 LAB — VITAMIN D 25 HYDROXY (VIT D DEFICIENCY, FRACTURES): VITD: 38.39 ng/mL (ref 30.00–100.00)

## 2023-05-14 LAB — TSH: TSH: 2.82 u[IU]/mL (ref 0.35–5.50)

## 2023-05-14 LAB — HEMOGLOBIN A1C: Hgb A1c MFr Bld: 6.6 % — ABNORMAL HIGH (ref 4.6–6.5)

## 2023-05-15 ENCOUNTER — Encounter: Payer: Self-pay | Admitting: Family Medicine

## 2023-05-15 ENCOUNTER — Ambulatory Visit (INDEPENDENT_AMBULATORY_CARE_PROVIDER_SITE_OTHER)

## 2023-05-15 DIAGNOSIS — R748 Abnormal levels of other serum enzymes: Secondary | ICD-10-CM | POA: Diagnosis not present

## 2023-05-15 DIAGNOSIS — D638 Anemia in other chronic diseases classified elsewhere: Secondary | ICD-10-CM

## 2023-05-15 DIAGNOSIS — D649 Anemia, unspecified: Secondary | ICD-10-CM | POA: Diagnosis not present

## 2023-05-15 DIAGNOSIS — R718 Other abnormality of red blood cells: Secondary | ICD-10-CM | POA: Diagnosis not present

## 2023-05-15 LAB — GAMMA GT: GGT: 163 U/L — ABNORMAL HIGH (ref 7–51)

## 2023-05-15 NOTE — Addendum Note (Signed)
 Addended by: Kenna Gilbert B on: 05/15/2023 11:01 AM   Modules accepted: Orders

## 2023-05-16 ENCOUNTER — Encounter: Payer: Self-pay | Admitting: Family Medicine

## 2023-05-16 LAB — IRON,TIBC AND FERRITIN PANEL
%SAT: 11 % — ABNORMAL LOW (ref 16–45)
Ferritin: 46 ng/mL (ref 16–288)
Iron: 38 ug/dL — ABNORMAL LOW (ref 45–160)
TIBC: 357 ug/dL (ref 250–450)

## 2023-05-16 NOTE — Addendum Note (Signed)
 Addended by: Kenna Gilbert B on: 05/16/2023 11:05 AM   Modules accepted: Orders

## 2023-05-16 NOTE — Addendum Note (Signed)
 Addended by: Kenna Gilbert B on: 05/16/2023 11:03 AM   Modules accepted: Orders

## 2023-05-29 ENCOUNTER — Other Ambulatory Visit: Payer: Self-pay | Admitting: Family Medicine

## 2023-06-02 ENCOUNTER — Ambulatory Visit (HOSPITAL_BASED_OUTPATIENT_CLINIC_OR_DEPARTMENT_OTHER)
Admission: RE | Admit: 2023-06-02 | Discharge: 2023-06-02 | Disposition: A | Discharge status: Home / Self Care | Source: Ambulatory Visit | Attending: Family Medicine | Admitting: Family Medicine

## 2023-06-02 DIAGNOSIS — R599 Enlarged lymph nodes, unspecified: Secondary | ICD-10-CM

## 2023-06-02 DIAGNOSIS — R7989 Other specified abnormal findings of blood chemistry: Secondary | ICD-10-CM | POA: Diagnosis not present

## 2023-06-02 DIAGNOSIS — M47819 Spondylosis without myelopathy or radiculopathy, site unspecified: Secondary | ICD-10-CM | POA: Diagnosis not present

## 2023-06-02 DIAGNOSIS — Z9049 Acquired absence of other specified parts of digestive tract: Secondary | ICD-10-CM | POA: Diagnosis not present

## 2023-06-02 DIAGNOSIS — K118 Other diseases of salivary glands: Secondary | ICD-10-CM | POA: Diagnosis not present

## 2023-06-02 DIAGNOSIS — R160 Hepatomegaly, not elsewhere classified: Secondary | ICD-10-CM | POA: Diagnosis not present

## 2023-06-02 DIAGNOSIS — K7689 Other specified diseases of liver: Secondary | ICD-10-CM | POA: Diagnosis not present

## 2023-06-02 DIAGNOSIS — R748 Abnormal levels of other serum enzymes: Secondary | ICD-10-CM | POA: Insufficient documentation

## 2023-06-02 MED ORDER — IOHEXOL 300 MG/ML  SOLN
75.0000 mL | Freq: Once | INTRAMUSCULAR | Status: AC | PRN
  Administered 2023-06-02: 75 mL via INTRAVENOUS

## 2023-06-04 ENCOUNTER — Encounter: Payer: Self-pay | Admitting: Family Medicine

## 2023-06-04 NOTE — Addendum Note (Signed)
 Addended by: Teodoro Feller B on: 06/04/2023 09:26 AM   Modules accepted: Orders

## 2023-06-05 ENCOUNTER — Other Ambulatory Visit: Payer: Self-pay | Admitting: Family Medicine

## 2023-06-05 DIAGNOSIS — G4733 Obstructive sleep apnea (adult) (pediatric): Secondary | ICD-10-CM | POA: Diagnosis not present

## 2023-06-08 ENCOUNTER — Encounter: Payer: Self-pay | Admitting: Family Medicine

## 2023-06-08 DIAGNOSIS — M19041 Primary osteoarthritis, right hand: Secondary | ICD-10-CM | POA: Diagnosis not present

## 2023-06-08 DIAGNOSIS — M25532 Pain in left wrist: Secondary | ICD-10-CM | POA: Diagnosis not present

## 2023-06-08 NOTE — Addendum Note (Signed)
 Addended by: Teodoro Feller B on: 06/08/2023 04:31 PM   Modules accepted: Orders

## 2023-06-21 ENCOUNTER — Other Ambulatory Visit: Payer: Self-pay

## 2023-06-21 DIAGNOSIS — Z9189 Other specified personal risk factors, not elsewhere classified: Secondary | ICD-10-CM

## 2023-06-21 DIAGNOSIS — Z8781 Personal history of (healed) traumatic fracture: Secondary | ICD-10-CM

## 2023-06-22 DIAGNOSIS — M25521 Pain in right elbow: Secondary | ICD-10-CM | POA: Diagnosis not present

## 2023-06-22 DIAGNOSIS — M79644 Pain in right finger(s): Secondary | ICD-10-CM | POA: Diagnosis not present

## 2023-06-22 DIAGNOSIS — M13841 Other specified arthritis, right hand: Secondary | ICD-10-CM | POA: Diagnosis not present

## 2023-06-22 DIAGNOSIS — M19041 Primary osteoarthritis, right hand: Secondary | ICD-10-CM | POA: Diagnosis not present

## 2023-06-26 DIAGNOSIS — D120 Benign neoplasm of cecum: Secondary | ICD-10-CM | POA: Diagnosis not present

## 2023-06-26 DIAGNOSIS — D123 Benign neoplasm of transverse colon: Secondary | ICD-10-CM | POA: Diagnosis not present

## 2023-06-26 DIAGNOSIS — Z860101 Personal history of adenomatous and serrated colon polyps: Secondary | ICD-10-CM | POA: Diagnosis not present

## 2023-06-26 DIAGNOSIS — D124 Benign neoplasm of descending colon: Secondary | ICD-10-CM | POA: Diagnosis not present

## 2023-06-26 DIAGNOSIS — Z09 Encounter for follow-up examination after completed treatment for conditions other than malignant neoplasm: Secondary | ICD-10-CM | POA: Diagnosis not present

## 2023-06-26 DIAGNOSIS — D122 Benign neoplasm of ascending colon: Secondary | ICD-10-CM | POA: Diagnosis not present

## 2023-06-27 ENCOUNTER — Encounter (INDEPENDENT_AMBULATORY_CARE_PROVIDER_SITE_OTHER): Payer: Self-pay | Admitting: Otolaryngology

## 2023-06-27 ENCOUNTER — Ambulatory Visit (INDEPENDENT_AMBULATORY_CARE_PROVIDER_SITE_OTHER): Admitting: Otolaryngology

## 2023-06-27 VITALS — BP 136/75 | HR 81 | Ht 63.0 in | Wt 260.0 lb

## 2023-06-27 DIAGNOSIS — K118 Other diseases of salivary glands: Secondary | ICD-10-CM

## 2023-06-27 DIAGNOSIS — J392 Other diseases of pharynx: Secondary | ICD-10-CM
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
## 2023-06-27 NOTE — Progress Notes (Unsigned)
 Roxie Cord, MD  [facility] Approved for US  guided FNA of left parotid nodule.  HKM       Previous Messages    ----- Message ----- From: Derrell Flight Sent: 06/27/2023  10:07 AM EDT To: Rosetta Cons, MD; Derrell Flight; * Subject: US  FNA SALIVARY GLAND/PAROTID GLAND            Procedure :US  FNA SALIVARY GLAND/PAROTID GLAND  Reason :deep lobe parotid/neck lesion; request FNA, not core Dx: Parotid mass  History :CT SOFT TISSUE NECK W CONTRAST,US  Abdomen Limited RUQ (LIVER/GB),MM 3D SCREENING MAMMOGRAM BILATERAL BREAST  Provider:Patel, Reina Cara, MD  Provider contact ; [phone]

## 2023-06-27 NOTE — Patient Instructions (Signed)
 I have ordered an imaging study for you to complete prior to your next visit. Please call [facility] Scheduling at [phone] to schedule your imaging if you have not received a call within 24 hours. If you are unable to complete your imaging study prior to your next scheduled visit please call our office to let us know.

## 2023-06-28 DIAGNOSIS — D122 Benign neoplasm of ascending colon: Secondary | ICD-10-CM | POA: Diagnosis not present

## 2023-06-28 DIAGNOSIS — D124 Benign neoplasm of descending colon: Secondary | ICD-10-CM | POA: Diagnosis not present

## 2023-06-28 DIAGNOSIS — D120 Benign neoplasm of cecum: Secondary | ICD-10-CM | POA: Diagnosis not present

## 2023-06-28 DIAGNOSIS — D123 Benign neoplasm of transverse colon: Secondary | ICD-10-CM | POA: Diagnosis not present

## 2023-07-11 ENCOUNTER — Ambulatory Visit (HOSPITAL_BASED_OUTPATIENT_CLINIC_OR_DEPARTMENT_OTHER)
Admission: RE | Admit: 2023-07-11 | Discharge: 2023-07-11 | Disposition: A | Discharge status: Home / Self Care | Source: Ambulatory Visit | Attending: Family Medicine | Admitting: Family Medicine

## 2023-07-11 DIAGNOSIS — M8589 Other specified disorders of bone density and structure, multiple sites: Secondary | ICD-10-CM | POA: Diagnosis not present

## 2023-07-11 DIAGNOSIS — M85851 Other specified disorders of bone density and structure, right thigh: Secondary | ICD-10-CM | POA: Diagnosis not present

## 2023-07-11 DIAGNOSIS — Z780 Asymptomatic menopausal state: Secondary | ICD-10-CM | POA: Diagnosis not present

## 2023-07-11 DIAGNOSIS — Z13820 Encounter for screening for osteoporosis: Secondary | ICD-10-CM | POA: Insufficient documentation

## 2023-07-11 DIAGNOSIS — M85852 Other specified disorders of bone density and structure, left thigh: Secondary | ICD-10-CM | POA: Diagnosis not present

## 2023-07-11 DIAGNOSIS — Z8781 Personal history of (healed) traumatic fracture: Secondary | ICD-10-CM | POA: Diagnosis not present

## 2023-07-11 DIAGNOSIS — Z9189 Other specified personal risk factors, not elsewhere classified: Secondary | ICD-10-CM | POA: Insufficient documentation

## 2023-07-12 ENCOUNTER — Other Ambulatory Visit: Payer: Self-pay | Admitting: Family Medicine

## 2023-07-12 ENCOUNTER — Other Ambulatory Visit: Payer: Self-pay | Admitting: Cardiology

## 2023-07-12 ENCOUNTER — Ambulatory Visit: Payer: Self-pay | Admitting: Family Medicine

## 2023-07-16 ENCOUNTER — Telehealth: Payer: Self-pay | Admitting: *Deleted

## 2023-07-16 NOTE — Telephone Encounter (Signed)
 Copied from CRM [id number]. Topic: Clinical - Lab/Test Results >> Jul 16, 2023 12:18 PM Jenice Mitts wrote: Reason for CRM: April from [facility] calling to see if the report from the patients bone density can be faxed over  ATTN: April  Fax#  [phone]

## 2023-07-16 NOTE — Telephone Encounter (Signed)
Dexa faxed

## 2023-07-20 ENCOUNTER — Other Ambulatory Visit: Payer: Self-pay | Admitting: Radiology

## 2023-07-20 DIAGNOSIS — K118 Other diseases of salivary glands: Secondary | ICD-10-CM

## 2023-07-22 ENCOUNTER — Other Ambulatory Visit: Payer: Self-pay | Admitting: Cardiology

## 2023-07-23 ENCOUNTER — Ambulatory Visit (HOSPITAL_COMMUNITY)
Admission: RE | Admit: 2023-07-23 | Discharge: 2023-07-23 | Disposition: A | Discharge status: Home / Self Care | Source: Ambulatory Visit | Attending: Otolaryngology | Admitting: Otolaryngology

## 2023-07-23 ENCOUNTER — Ambulatory Visit (HOSPITAL_COMMUNITY)
Admission: RE | Admit: 2023-07-23 | Discharge: 2023-07-23 | Disposition: A | Discharge status: Home / Self Care | Source: Ambulatory Visit | Attending: Otolaryngology

## 2023-07-23 DIAGNOSIS — K118 Other diseases of salivary glands: Secondary | ICD-10-CM | POA: Diagnosis not present

## 2023-07-23 DIAGNOSIS — R897 Abnormal histological findings in specimens from other organs, systems and tissues: Secondary | ICD-10-CM | POA: Diagnosis not present

## 2023-07-23 DIAGNOSIS — R896 Abnormal cytological findings in specimens from other organs, systems and tissues: Secondary | ICD-10-CM | POA: Diagnosis not present

## 2023-07-23 MED ORDER — LIDOCAINE HCL 1 % IJ SOLN
INTRAMUSCULAR | Status: AC
  Filled 2023-07-23: qty 20

## 2023-07-23 NOTE — Procedures (Signed)
 Vascular and Interventional Radiology Procedure Note  Patient: LILLA CALLEJO DOB: 19-Aug-1955 Medical Record Number: [id number] Note Date/Time: 07/23/23 2:06 PM   Performing Physician: Art Largo, MD Assistant(s): None  Diagnosis: L parotid mass.   Procedure: LEFT PAROTID MASS FNA BIOPSY  Anesthesia: Local Anesthetic Complications: None Estimated Blood Loss: Minimal Specimens: Sent for Pathology  Findings:  Successful Ultrasound-guided biopsy of L parotid mass. A total of 3 samples were obtained. Hemostasis of the tract was achieved using Manual Pressure.  Plan: Bed rest for 0 hours.  See detailed procedure note with images in PACS. The patient tolerated the procedure well without incident or complication and was returned to Recovery in stable condition.    Art Largo, MD [facility]   Pager. [phone] Clinic. [phone]

## 2023-07-25 LAB — CYTOLOGY - NON PAP

## 2023-07-31 DIAGNOSIS — M19041 Primary osteoarthritis, right hand: Secondary | ICD-10-CM | POA: Diagnosis not present

## 2023-07-31 DIAGNOSIS — M79644 Pain in right finger(s): Secondary | ICD-10-CM | POA: Diagnosis not present

## 2023-07-31 DIAGNOSIS — M25521 Pain in right elbow: Secondary | ICD-10-CM | POA: Diagnosis not present

## 2023-08-13 ENCOUNTER — Ambulatory Visit (INDEPENDENT_AMBULATORY_CARE_PROVIDER_SITE_OTHER): Admitting: Otolaryngology

## 2023-08-13 ENCOUNTER — Encounter (INDEPENDENT_AMBULATORY_CARE_PROVIDER_SITE_OTHER): Payer: Self-pay | Admitting: Otolaryngology

## 2023-08-13 VITALS — BP 157/76 | HR 84 | Ht 63.0 in | Wt 260.0 lb

## 2023-08-13 DIAGNOSIS — K118 Other diseases of salivary glands: Secondary | ICD-10-CM

## 2023-08-13 DIAGNOSIS — J392 Other diseases of pharynx: Secondary | ICD-10-CM | POA: Diagnosis not present
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
## 2023-08-22 ENCOUNTER — Other Ambulatory Visit: Payer: Self-pay | Admitting: Family Medicine

## 2023-08-22 MED ORDER — ALLOPURINOL 300 MG PO TABS: 300.0000 mg | ORAL_TABLET | Freq: Every day | ORAL | 0 refills | Status: DC

## 2023-08-22 NOTE — Telephone Encounter (Signed)
 Copied from CRM [id number]. Topic: Clinical - Medication Refill >> Aug 22, 2023 12:57 PM Suzen RAMAN wrote: Medication: allopurinol  (ZYLOPRIM ) 300 MG tablet 90 day supply  Has the patient contacted their pharmacy? Yes  This is the patient's preferred pharmacy:  [id number] - [place], [place] - [address] [place] [address] Phone: [phone] Fax: [phone]  Is this the correct pharmacy for this prescription? Yes If no, delete pharmacy and type the correct one.   Has the prescription been filled recently? No  Is the patient out of the medication? No  Has the patient been seen for an appointment in the last year OR does the patient have an upcoming appointment? Yes  Can we respond through MyChart? Yes  Agent: Please be advised that Rx refills may take up to 3 business days. We ask that you follow-up with your pharmacy.

## 2023-08-27 DIAGNOSIS — L820 Inflamed seborrheic keratosis: Secondary | ICD-10-CM | POA: Diagnosis not present

## 2023-08-27 DIAGNOSIS — L918 Other hypertrophic disorders of the skin: Secondary | ICD-10-CM | POA: Diagnosis not present

## 2023-08-27 DIAGNOSIS — L821 Other seborrheic keratosis: Secondary | ICD-10-CM | POA: Diagnosis not present

## 2023-08-27 DIAGNOSIS — D2262 Melanocytic nevi of left upper limb, including shoulder: Secondary | ICD-10-CM | POA: Diagnosis not present

## 2023-08-27 DIAGNOSIS — I8312 Varicose veins of left lower extremity with inflammation: Secondary | ICD-10-CM | POA: Diagnosis not present

## 2023-08-27 DIAGNOSIS — L814 Other melanin hyperpigmentation: Secondary | ICD-10-CM | POA: Diagnosis not present

## 2023-08-27 DIAGNOSIS — I8311 Varicose veins of right lower extremity with inflammation: Secondary | ICD-10-CM | POA: Diagnosis not present

## 2023-08-27 DIAGNOSIS — L905 Scar conditions and fibrosis of skin: Secondary | ICD-10-CM | POA: Diagnosis not present

## 2023-08-27 DIAGNOSIS — L738 Other specified follicular disorders: Secondary | ICD-10-CM | POA: Diagnosis not present

## 2023-09-17 ENCOUNTER — Other Ambulatory Visit: Payer: Self-pay | Admitting: Nurse Practitioner

## 2023-09-17 DIAGNOSIS — K754 Autoimmune hepatitis: Secondary | ICD-10-CM

## 2023-09-17 DIAGNOSIS — K7581 Nonalcoholic steatohepatitis (NASH): Secondary | ICD-10-CM

## 2023-09-17 DIAGNOSIS — R748 Abnormal levels of other serum enzymes: Secondary | ICD-10-CM

## 2023-09-25 ENCOUNTER — Ambulatory Visit
Admission: RE | Admit: 2023-09-25 | Discharge: 2023-09-25 | Disposition: A | Discharge status: Home / Self Care | Source: Ambulatory Visit | Attending: Nurse Practitioner | Admitting: Nurse Practitioner

## 2023-09-25 DIAGNOSIS — K760 Fatty (change of) liver, not elsewhere classified: Secondary | ICD-10-CM | POA: Diagnosis not present

## 2023-09-25 DIAGNOSIS — K754 Autoimmune hepatitis: Secondary | ICD-10-CM

## 2023-09-25 DIAGNOSIS — K7581 Nonalcoholic steatohepatitis (NASH): Secondary | ICD-10-CM

## 2023-09-25 DIAGNOSIS — R748 Abnormal levels of other serum enzymes: Secondary | ICD-10-CM

## 2023-10-09 ENCOUNTER — Other Ambulatory Visit: Payer: Self-pay | Admitting: Family Medicine

## 2023-10-09 ENCOUNTER — Other Ambulatory Visit: Payer: Self-pay | Admitting: Cardiology

## 2023-10-24 ENCOUNTER — Ambulatory Visit: Admitting: Pulmonary Disease

## 2023-10-24 ENCOUNTER — Encounter: Payer: Self-pay | Admitting: Pulmonary Disease

## 2023-10-24 VITALS — BP 120/72 | HR 88 | Ht 63.0 in | Wt 268.0 lb

## 2023-10-24 DIAGNOSIS — R053 Chronic cough: Secondary | ICD-10-CM | POA: Diagnosis not present

## 2023-10-24 DIAGNOSIS — R0609 Other forms of dyspnea: Secondary | ICD-10-CM | POA: Diagnosis not present

## 2023-10-24 DIAGNOSIS — J45909 Unspecified asthma, uncomplicated: Secondary | ICD-10-CM | POA: Diagnosis not present

## 2023-10-24 DIAGNOSIS — J4540 Moderate persistent asthma, uncomplicated: Secondary | ICD-10-CM

## 2023-10-24 NOTE — Patient Instructions (Signed)
 Good to see you again  No change in the medication  Return to clinic in 6 months or sooner as needed with Dr. Annella
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
## 2023-11-02 DIAGNOSIS — Z961 Presence of intraocular lens: Secondary | ICD-10-CM | POA: Diagnosis not present

## 2023-11-02 DIAGNOSIS — H524 Presbyopia: Secondary | ICD-10-CM | POA: Diagnosis not present

## 2023-11-02 DIAGNOSIS — H04123 Dry eye syndrome of bilateral lacrimal glands: Secondary | ICD-10-CM | POA: Diagnosis not present

## 2023-11-02 DIAGNOSIS — E119 Type 2 diabetes mellitus without complications: Secondary | ICD-10-CM | POA: Diagnosis not present

## 2023-11-02 LAB — HM DIABETES EYE EXAM

## 2023-11-12 ENCOUNTER — Encounter: Payer: Self-pay | Admitting: Family Medicine

## 2023-11-12 ENCOUNTER — Ambulatory Visit: Admitting: Family Medicine

## 2023-11-12 VITALS — BP 132/84 | HR 75 | Temp 98.1°F | Ht 63.0 in | Wt 266.0 lb

## 2023-11-12 DIAGNOSIS — N2889 Other specified disorders of kidney and ureter: Secondary | ICD-10-CM

## 2023-11-12 DIAGNOSIS — K219 Gastro-esophageal reflux disease without esophagitis: Secondary | ICD-10-CM

## 2023-11-12 DIAGNOSIS — E559 Vitamin D deficiency, unspecified: Secondary | ICD-10-CM

## 2023-11-12 DIAGNOSIS — F32A Depression, unspecified: Secondary | ICD-10-CM | POA: Diagnosis not present

## 2023-11-12 DIAGNOSIS — E119 Type 2 diabetes mellitus without complications: Secondary | ICD-10-CM

## 2023-11-12 DIAGNOSIS — F419 Anxiety disorder, unspecified: Secondary | ICD-10-CM | POA: Diagnosis not present

## 2023-11-12 DIAGNOSIS — Z7984 Long term (current) use of oral hypoglycemic drugs: Secondary | ICD-10-CM

## 2023-11-12 DIAGNOSIS — Z1159 Encounter for screening for other viral diseases: Secondary | ICD-10-CM

## 2023-11-12 DIAGNOSIS — E785 Hyperlipidemia, unspecified: Secondary | ICD-10-CM | POA: Diagnosis not present

## 2023-11-12 DIAGNOSIS — Z8739 Personal history of other diseases of the musculoskeletal system and connective tissue: Secondary | ICD-10-CM | POA: Diagnosis not present

## 2023-11-12 LAB — HEMOGLOBIN A1C: Hgb A1c MFr Bld: 7.1 % — ABNORMAL HIGH (ref 4.6–6.5)

## 2023-11-12 LAB — MICROALBUMIN / CREATININE URINE RATIO
Creatinine,U: 33.1 mg/dL
Microalb Creat Ratio: UNDETERMINED mg/g (ref 0.0–30.0)
Microalb, Ur: <0.7 mg/dL

## 2023-11-12 LAB — VITAMIN D 25 HYDROXY (VIT D DEFICIENCY, FRACTURES): VITD: 35.86 ng/mL (ref 30.00–100.00)

## 2023-11-12 NOTE — Progress Notes (Unsigned)
 Established Patient Office Visit   Subjective:  Patient ID: [name], female    DOB: 10-20-1955  Age: 68 y.o. MRN: [id number]  Chief Complaint  Patient presents with   Medical Management of Chronic Issues    6 month follow up     HPI Gout: Chronic. Patient has been taking Allopurinol  300mg  daily. Denies a recent gout flare up.    Anxiety: Chronic. Patient has been taking Alprazolam  0.25mg  about once a month, but it is prescribed 3 times daily as needed. Only prescription and refill was 10/18/2021 with Dr. Massie Rosalva Sewer for 90 tablet. She reports retiring has helped her anxiety. She reports she took one the other day with her mother passing in hospice and her husband has a family member dying. Depression: Chronic. Patient is taking Sertraline  200mg  daily. Effective. Denies any SI or HI.    Potassium Supplement: She reports she has a borderline low potassium level. Prescribed Klor-Con  20mEq daily. Suspect this is from taking Lasix  daily. Cardiology is managing Lasix .    Diabetes, Type 2: Chronic. Patient is taking Metformin  1,000 BID. She reports she monitors her blood sugars once a week. Ranging around 110 right after eating breakfast. Reports intermittent increase in being thirsty. Denies increase in urination or hunger.    GERD: Chronic. Patient is taking Pantoprazole  40mg  BID. Effective. Rarely not controlled, but usually from eating something spicy.    Hyperlipidemia: Chronic. Patient is taking Rosuvastatin  5mg  daily. Denies any unusual muscle or joint pain, abd pain, nausea, or vomiting.    Cardiology, Dr. Peter Swaziland with [facility] is managing her HTN. She is taking Losartan , Carvedilol , and Amlodipine  for HTN. Last appt was on 01/02/2023 with a one year follow up.   Patient had a ultrasound of abd complete with elastograpy due to NASH, elevated LFTs on 09/25/2023. 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 Recommend a CT or MRI to further evaluate. She has a history of renal cell carcinoma back in 2006 with a partial left nephrectomy.   Patient has an upcoming parotid gland excision surgery with ENT, Dr. Tobie on 10/13.At her physical back in March, she complained of her neck puffing up a bit and hand a CT neck scan. Continues to have left parotid nodule (vs lymph node) it appears, stable in size over the past few years. FNA with atypical cells. She opted for surgery.  ROS See HPI above     Objective:   BP 132/84   Pulse 75   Temp 98.1 F (36.7 C) (Oral)   Ht 5' 3 (1.6 m)   Wt 266 lb (120.7 kg)   SpO2 93%   BMI 47.12 kg/m  {Vitals History (Optional):23777}  Physical Exam Vitals reviewed.  Constitutional:      General: She is not in acute distress.    Appearance: Normal appearance. She is obese. She is not ill-appearing, toxic-appearing or diaphoretic.  HENT:     Head: Normocephalic and atraumatic.  Eyes:     General:        Right eye: No discharge.        Left eye: No discharge.     Conjunctiva/sclera: Conjunctivae normal.  Cardiovascular:     Rate and Rhythm: Normal rate and regular rhythm.     Pulses:          Dorsalis pedis pulses are 2+ on the right side and 2+ on the  left side.     Heart sounds: Normal heart sounds. No murmur heard.    No friction rub. No gallop.  Pulmonary:     Effort: Pulmonary effort is normal. No respiratory distress.     Breath sounds: Normal breath sounds.  Musculoskeletal:        General: Normal range of motion.  Feet:     Right foot:     Protective Sensation: 10 sites tested.  10 sites sensed.     Skin integrity: Skin integrity normal.     Left foot:     Protective Sensation: 10 sites tested.  10 sites sensed.     Skin integrity: Skin integrity normal.  Skin:    General: Skin is warm and dry.  Neurological:     General: No focal deficit present.     Mental Status: She is alert and  oriented to person, place, and time. Mental status is at baseline.  Psychiatric:        Mood and Affect: Mood normal.        Behavior: Behavior normal.        Thought Content: Thought content normal.        Judgment: Judgment normal.      Assessment & Plan:  History of gout  Anxiety and depression  Type 2 diabetes mellitus without complication, without long-term current use of insulin (HCC) -     Microalbumin / creatinine urine ratio -     Hemoglobin A1c  Gastroesophageal reflux disease without esophagitis  Hyperlipidemia, unspecified hyperlipidemia type  Vitamin D  deficiency -     VITAMIN D  25 Hydroxy (Vit-D Deficiency, Fractures)  Left kidney mass -     MR ABDOMEN W CONTRAST; Future  Need for hepatitis C screening test -     Hepatitis C antibody  1.Review health maintenance: -Covid booster: Will obtain at pharmacy  -Hep C:Ordered  -Influenza vaccine: Will obtain at pharmacy  -AWV: 12/11/2023 scheduled   Return in about 6 months (around 05/11/2024) for chronic management.   Amberlee Garvey, NP

## 2023-11-12 NOTE — Patient Instructions (Signed)
-  It was great to see you!  -Continue all medications. -Ordered labs and urine. Office will call with results and will be available via MyChart. -Ordered MRI abd to evaluate the mass in the left kidney. Please call the office or send a MyChart message if you do not receive a phone call about appointment in 1 week. -If you receive any vaccines at your local pharmacy, please ask pharmacy to send a copy of vaccine to the office so we may update your health maintenance guidelines.  -Follow up with cardiology in November for your one year follow up. Call their office to schedule an appointment.  -Follow up in 6 months.

## 2023-11-13 LAB — HEPATITIS C ANTIBODY: Hepatitis C Ab: NONREACTIVE

## 2023-11-14 ENCOUNTER — Ambulatory Visit: Payer: Self-pay | Admitting: Family Medicine

## 2023-11-14 NOTE — Assessment & Plan Note (Addendum)
 Stable. Continue with Metformin  1,000 BID. Ordered A1c and microalbumin/creatinine urine ratio. BMP was stable back in August. Foot exam completed. Ophthalmology exam-reported it was completed within the last year with [facility], will request records. Patient is on a statin and ARB.

## 2023-11-14 NOTE — Assessment & Plan Note (Addendum)
 Controlled. Continue with Sertraline  200mg  daily. Effective. Scored 4 on PHQ-9 and GAD-7. Denies SI and HI. Patient reports she has been taking Alprazolam  0.25mg  about once a month, but it was prescribed differently. PDMP reviewed, only prescribed once 10/18/2021 for 90 tablets with Dr. Massie Rosalva Sewer, [facility]. Patient has not completed a UDS or signed a controlled substance agreement.

## 2023-11-14 NOTE — Assessment & Plan Note (Signed)
 Stable. Continue all blood pressure medications and needs to follow up with cardiology. Last appointment was 01/02/2023.[name]

## 2023-11-14 NOTE — Assessment & Plan Note (Signed)
Stable. Continue with Allopurinol 300mg  daily.

## 2023-11-14 NOTE — Assessment & Plan Note (Signed)
 Stable.  Continue  Pantoprazole 40mg  daily

## 2023-11-14 NOTE — Assessment & Plan Note (Signed)
 Patient is taking a supplement, but has a history of vitamin D  deficiency.  Ordered Vitamin D  level today.

## 2023-11-14 NOTE — Assessment & Plan Note (Signed)
 Continue with Rosuvastatin   5mg  daily. Did not draw a lipid because patient reported she had ate cereal before appointment. Recommend to be fasting at cardiology in November, by some chance if cardiology didn't obtain, be fasting at next appointment.

## 2023-11-17 ENCOUNTER — Other Ambulatory Visit: Payer: Self-pay | Admitting: Family Medicine

## 2023-11-21 NOTE — Progress Notes (Signed)
 Surgical Instructions   Your procedure is scheduled on Monday, October 13th. Report to [facility] Main Entrance A at 11:00 A.M., then check in with the Admitting office. Any questions or running late day of surgery: call [phone]  Questions prior to your surgery date: call [phone], Monday-Friday, 8am-4pm. If you experience any cold or flu symptoms such as cough, fever, chills, shortness of breath, etc. between now and your scheduled surgery, please notify us  at the above number.     Remember:  Do not eat after midnight the night before your surgery   You may drink clear liquids until 10:00 the morning of your surgery.   Clear liquids allowed are: Water, Non-Citrus Juices (without pulp), Carbonated Beverages, Clear Tea (no milk, honey, etc.), Black Coffee Only (NO MILK, CREAM OR POWDERED CREAMER of any kind), and Gatorade.    Take these medicines the morning of surgery with A SIP OF WATER  allopurinol  (ZYLOPRIM )  amLODipine  (NORVASC )  carvedilol  (COREG )  loratadine (CLARITIN)  pantoprazole  (PROTONIX )  rosuvastatin  (CRESTOR )    May take these medicines IF NEEDED: albuterol  (VENTOLIN  HFA)  ALPRAZolam  (XANAX )   One week prior to surgery, STOP taking any Aspirin (unless otherwise instructed by your surgeon) Aleve, Naproxen, Ibuprofen, Motrin, Advil, Goody's, BC's, all herbal medications, fish oil, and non-prescription vitamins.  WHAT DO I DO ABOUT MY DIABETES MEDICATION?   Do not take oral diabetes medicines metFORMIN  (GLUCOPHAGE )  the morning of surgery.   HOW TO MANAGE YOUR DIABETES BEFORE AND AFTER SURGERY  Why is it important to control my blood sugar before and after surgery? Improving blood sugar levels before and after surgery helps healing and can limit problems. A way of improving blood sugar control is eating a healthy diet by:  Eating less sugar and carbohydrates  Increasing activity/exercise  Talking with your doctor about reaching your blood sugar  goals High blood sugars (greater than 180 mg/dL) can raise your risk of infections and slow your recovery, so you will need to focus on controlling your diabetes during the weeks before surgery. Make sure that the doctor who takes care of your diabetes knows about your planned surgery including the date and location.  How do I manage my blood sugar before surgery? Check your blood sugar at least 4 times a day, starting 2 days before surgery, to make sure that the level is not too high or low.  Check your blood sugar the morning of your surgery when you wake up and every 2 hours until you get to the Short Stay unit.  If your blood sugar is less than 70 mg/dL, you will need to treat for low blood sugar: Do not take insulin. Treat a low blood sugar (less than 70 mg/dL) with  cup of clear juice (cranberry or apple), 4 glucose tablets, OR glucose gel. Recheck blood sugar in 15 minutes after treatment (to make sure it is greater than 70 mg/dL). If your blood sugar is not greater than 70 mg/dL on recheck, call [phone] for further instructions. Report your blood sugar to the short stay nurse when you get to Short Stay.  If you are admitted to the hospital after surgery: Your blood sugar will be checked by the staff and you will probably be given insulin after surgery (instead of oral diabetes medicines) to make sure you have good blood sugar levels. The goal for blood sugar control after surgery is 80-180 mg/dL.  Do NOT Smoke (Tobacco/Vaping) for 24 hours prior to your procedure.  If you use a CPAP at night, you may bring your mask/headgear for your overnight stay.   You will be asked to remove any contacts, glasses, piercing's, hearing aid's, dentures/partials prior to surgery. Please bring cases for these items if needed.    Patients discharged the day of surgery will not be allowed to drive home, and someone needs to stay with them for 24 hours.  SURGICAL WAITING ROOM  VISITATION Patients may have no more than 2 support people in the waiting area - these visitors may rotate.   Pre-op nurse will coordinate an appropriate time for 1 ADULT support person, who may not rotate, to accompany patient in pre-op.  Children under the age of 42 must have an adult with them who is not the patient and must remain in the main waiting area with an adult.  If the patient needs to stay at the hospital during part of their recovery, the visitor guidelines for inpatient rooms apply.  Please refer to the [facility] website for the visitor guidelines for any additional information.   If you received a COVID test during your pre-op visit  it is requested that you wear a mask when out in public, stay away from anyone that may not be feeling well and notify your surgeon if you develop symptoms. If you have been in contact with anyone that has tested positive in the last 10 days please notify you surgeon.      Pre-operative CHG Bathing Instructions   You can play a key role in reducing the risk of infection after surgery. Your skin needs to be as free of germs as possible. You can reduce the number of germs on your skin by washing with CHG (chlorhexidine  gluconate) soap before surgery. CHG is an antiseptic soap that kills germs and continues to kill germs even after washing.   DO NOT use if you have an allergy to chlorhexidine /CHG or antibacterial soaps. If your skin becomes reddened or irritated, stop using the CHG and notify one of our RNs at [phone].              TAKE A SHOWER THE NIGHT BEFORE SURGERY   Please keep in mind the following:  DO NOT shave, including legs and underarms, 48 hours prior to surgery.   You may shave your face before/day of surgery.  Place clean sheets on your bed the night before surgery Use a clean washcloth (not used since being washed) for shower. DO NOT sleep with pet's night before surgery.  CHG Shower Instructions:  Wash your face and  private area with normal soap. If you choose to wash your hair, wash first with your normal shampoo.  After you use shampoo/soap, rinse your hair and body thoroughly to remove shampoo/soap residue.  Turn the water OFF and apply half the bottle of CHG soap to a CLEAN washcloth.  Apply CHG soap ONLY FROM YOUR NECK DOWN TO YOUR TOES (washing for 3-5 minutes)  DO NOT use CHG soap on face, private areas, open wounds, or sores.  Pay special attention to the area where your surgery is being performed.  If you are having back surgery, having someone wash your back for you may be helpful. Wait 2 minutes after CHG soap is applied, then you may rinse off the CHG soap.  Pat dry with a clean towel  Put on clean pajamas    Additional instructions for the day  of surgery: If you choose, you may shower the morning of surgery with an antibacterial soap.  DO NOT APPLY any lotions, deodorants, cologne, or perfumes.   Do not wear jewelry or makeup Do not wear nail polish, gel polish, artificial nails, or any other type of covering on natural nails (fingers and toes) Do not bring valuables to the hospital. [facility] is not responsible for valuables/personal belongings. Put on clean/comfortable clothes.  Please brush your teeth.  Ask your nurse before applying any prescription medications to the skin.

## 2023-11-22 ENCOUNTER — Encounter (HOSPITAL_COMMUNITY)
Admission: RE | Admit: 2023-11-22 | Discharge: 2023-11-22 | Disposition: A | Discharge status: Home / Self Care | Source: Ambulatory Visit | Attending: Otolaryngology | Admitting: Otolaryngology

## 2023-11-22 ENCOUNTER — Other Ambulatory Visit: Payer: Self-pay

## 2023-11-22 ENCOUNTER — Encounter (HOSPITAL_COMMUNITY): Payer: Self-pay

## 2023-11-22 VITALS — BP 127/65 | HR 73 | Temp 98.4°F | Resp 18 | Ht 63.0 in | Wt 268.3 lb

## 2023-11-22 DIAGNOSIS — G4733 Obstructive sleep apnea (adult) (pediatric): Secondary | ICD-10-CM | POA: Insufficient documentation

## 2023-11-22 DIAGNOSIS — Z7984 Long term (current) use of oral hypoglycemic drugs: Secondary | ICD-10-CM | POA: Insufficient documentation

## 2023-11-22 DIAGNOSIS — K754 Autoimmune hepatitis: Secondary | ICD-10-CM | POA: Insufficient documentation

## 2023-11-22 DIAGNOSIS — I10 Essential (primary) hypertension: Secondary | ICD-10-CM | POA: Diagnosis not present

## 2023-11-22 DIAGNOSIS — Z01818 Encounter for other preprocedural examination: Secondary | ICD-10-CM

## 2023-11-22 DIAGNOSIS — M069 Rheumatoid arthritis, unspecified: Secondary | ICD-10-CM | POA: Diagnosis not present

## 2023-11-22 DIAGNOSIS — Z01812 Encounter for preprocedural laboratory examination: Secondary | ICD-10-CM | POA: Diagnosis not present

## 2023-11-22 DIAGNOSIS — D61818 Other pancytopenia: Secondary | ICD-10-CM | POA: Insufficient documentation

## 2023-11-22 DIAGNOSIS — K118 Other diseases of salivary glands: Secondary | ICD-10-CM | POA: Insufficient documentation

## 2023-11-22 DIAGNOSIS — Z79899 Other long term (current) drug therapy: Secondary | ICD-10-CM | POA: Insufficient documentation

## 2023-11-22 DIAGNOSIS — M109 Gout, unspecified: Secondary | ICD-10-CM | POA: Diagnosis not present

## 2023-11-22 DIAGNOSIS — J45909 Unspecified asthma, uncomplicated: Secondary | ICD-10-CM | POA: Diagnosis not present

## 2023-11-22 DIAGNOSIS — E119 Type 2 diabetes mellitus without complications: Secondary | ICD-10-CM | POA: Insufficient documentation

## 2023-11-22 DIAGNOSIS — K219 Gastro-esophageal reflux disease without esophagitis: Secondary | ICD-10-CM | POA: Diagnosis not present

## 2023-11-22 DIAGNOSIS — Z85528 Personal history of other malignant neoplasm of kidney: Secondary | ICD-10-CM | POA: Diagnosis not present

## 2023-11-22 DIAGNOSIS — Z9889 Other specified postprocedural states: Secondary | ICD-10-CM | POA: Insufficient documentation

## 2023-11-22 DIAGNOSIS — Z85020 Personal history of malignant carcinoid tumor of stomach: Secondary | ICD-10-CM | POA: Diagnosis not present

## 2023-11-22 LAB — BASIC METABOLIC PANEL WITH GFR
Anion gap: 11 (ref 5–15)
BUN: 12 mg/dL (ref 8–23)
CO2: 25 mmol/L (ref 22–32)
Calcium: 9.2 mg/dL (ref 8.9–10.3)
Chloride: 103 mmol/L (ref 98–111)
Creatinine, Ser: 0.88 mg/dL (ref 0.44–1.00)
GFR, Estimated: >60 mL/min (ref >60)
Glucose, Bld: 137 mg/dL — ABNORMAL HIGH (ref 70–99)
Potassium: 3.8 mmol/L (ref 3.5–5.1)
Sodium: 139 mmol/L (ref 135–145)

## 2023-11-22 LAB — CBC
HCT: 36.9 % (ref 36.0–46.0)
Hemoglobin: 12 g/dL (ref 12.0–15.0)
MCH: 28.2 pg (ref 26.0–34.0)
MCHC: 32.5 g/dL (ref 30.0–36.0)
MCV: 86.6 fL (ref 80.0–100.0)
Platelets: 274 K/uL (ref 150–400)
RBC: 4.26 MIL/uL (ref 3.87–5.11)
RDW: 14.9 % (ref 11.5–15.5)
WBC: 8.5 K/uL (ref 4.0–10.5)
nRBC: 0 % (ref 0.0–0.2)

## 2023-11-22 LAB — GLUCOSE, CAPILLARY: Glucose-Capillary: 135 mg/dL — ABNORMAL HIGH (ref 70–99)

## 2023-11-22 NOTE — Progress Notes (Signed)
 PCP - Philippe Slade NP Cardiologist - Peter [place]  PPM/ICD - Denies Device Orders - n/a Rep Notified - n/a  Chest x-ray - N/A  EKG - 01-02-23 Stress Test - 05-19-21 ECHO - 05-12-21 Cardiac Cath - Denies  Sleep Study - Yes,  CPAP - wears nightly  Fasting Blood Sugar - 120's Checks Blood Sugar checks once a week  Last dose of GLP1 agonist-  Denies GLP1 instructions: n/a  Blood Thinner Instructions: Denies Aspirin Instructions: Denies  ERAS Protcol - Clears until 1000 PRE-SURGERY Ensure or G2- none  COVID TEST- n/a   Anesthesia review: Yes, DM2, HTN, Asthma, OSA w/cpap  Patient denies shortness of breath, fever, cough and chest pain at PAT appointment. Patient denies any respiratory issues at this time.    All instructions explained to the patient, with a verbal understanding of the material. Patient agrees to go over the instructions while at home for a better understanding. Patient also instructed to self quarantine after being tested for COVID-19. The opportunity to ask questions was provided.

## 2023-11-22 NOTE — Progress Notes (Signed)
 This RN requested preop surgical orders
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
## 2023-11-23 NOTE — Anesthesia Preprocedure Evaluation (Addendum)
 Anesthesia Evaluation  Patient identified by MRN, date of birth, ID band Patient awake    Reviewed: Allergy & Precautions, NPO status , Patient's Chart, lab work & pertinent test results, reviewed documented beta blocker date and time   History of Anesthesia Complications Negative for: history of anesthetic complications  Airway Mallampati: III  TM Distance: >3 FB Neck ROM: Full    Dental  (+) Dental Advisory Given, Caps   Pulmonary asthma , sleep apnea and Continuous Positive Airway Pressure Ventilation    Pulmonary exam normal        Cardiovascular hypertension, Pt. on medications and Pt. on home beta blockers Normal cardiovascular exam     Neuro/Psych  PSYCHIATRIC DISORDERS Anxiety Depression     Hx TBI Hearing aid(s)     GI/Hepatic ,GERD  Medicated and Controlled,,(+) Hepatitis -, Autoimmune Carcinoid tumor of stomach    Endo/Other  diabetes, Type 2, Oral Hypoglycemic Agents  Class 3 obesity  Renal/GU  Hx RCC      Musculoskeletal  (+) Arthritis , Rheumatoid disorders,   PMR    Abdominal  (+) + obese  Peds  Hematology negative hematology ROS (+)   Anesthesia Other Findings Chronic pain syndrome   Reproductive/Obstetrics                              Anesthesia Physical Anesthesia Plan  ASA: 3  Anesthesia Plan: General   Post-op Pain Management: Tylenol  PO (pre-op)* and Ketamine  IV*   Induction: Intravenous  PONV Risk Score and Plan: 3 and Treatment may vary due to age or medical condition, Ondansetron , TIVA and Midazolam   Airway Management Planned: Oral ETT and Video Laryngoscope Planned  Additional Equipment: None  Intra-op Plan:   Post-operative Plan: Extubation in OR  Informed Consent: I have reviewed the patients History and Physical, chart, labs and discussed the procedure including the risks, benefits and alternatives for the proposed anesthesia with the  patient or authorized representative who has indicated his/her understanding and acceptance.     Dental advisory given  Plan Discussed with: CRNA and Anesthesiologist  Anesthesia Plan Comments: (PAT note written 11/23/2023 by Allison Zelenak, PA-C.  )         Anesthesia Quick Evaluation

## 2023-11-26 ENCOUNTER — Inpatient Hospital Stay (HOSPITAL_COMMUNITY)

## 2023-11-26 ENCOUNTER — Inpatient Hospital Stay (HOSPITAL_COMMUNITY): Payer: Self-pay | Admitting: Medical

## 2023-11-26 ENCOUNTER — Encounter (HOSPITAL_COMMUNITY)
Admission: RE | Disposition: A | Discharge status: Home / Self Care | Payer: Self-pay | Source: Home / Self Care | Attending: Otolaryngology

## 2023-11-26 ENCOUNTER — Other Ambulatory Visit: Payer: Self-pay

## 2023-11-26 ENCOUNTER — Inpatient Hospital Stay (HOSPITAL_COMMUNITY)
Admission: RE | Admit: 2023-11-26 | Discharge: 2023-11-27 | DRG: 144 | Disposition: A | Discharge status: Home / Self Care | Attending: Otolaryngology | Admitting: Otolaryngology

## 2023-11-26 ENCOUNTER — Encounter (HOSPITAL_COMMUNITY): Payer: Self-pay

## 2023-11-26 DIAGNOSIS — K0889 Other specified disorders of teeth and supporting structures: Secondary | ICD-10-CM | POA: Diagnosis present

## 2023-11-26 DIAGNOSIS — R590 Localized enlarged lymph nodes: Secondary | ICD-10-CM | POA: Diagnosis not present

## 2023-11-26 DIAGNOSIS — I10 Essential (primary) hypertension: Secondary | ICD-10-CM | POA: Diagnosis present

## 2023-11-26 DIAGNOSIS — E559 Vitamin D deficiency, unspecified: Secondary | ICD-10-CM | POA: Diagnosis present

## 2023-11-26 DIAGNOSIS — E119 Type 2 diabetes mellitus without complications: Secondary | ICD-10-CM | POA: Diagnosis not present

## 2023-11-26 DIAGNOSIS — Z885 Allergy status to narcotic agent status: Secondary | ICD-10-CM

## 2023-11-26 DIAGNOSIS — F419 Anxiety disorder, unspecified: Secondary | ICD-10-CM | POA: Diagnosis present

## 2023-11-26 DIAGNOSIS — G894 Chronic pain syndrome: Secondary | ICD-10-CM | POA: Diagnosis present

## 2023-11-26 DIAGNOSIS — Z6841 Body Mass Index (BMI) 40.0 and over, adult: Secondary | ICD-10-CM

## 2023-11-26 DIAGNOSIS — G4733 Obstructive sleep apnea (adult) (pediatric): Secondary | ICD-10-CM | POA: Diagnosis not present

## 2023-11-26 DIAGNOSIS — D110 Benign neoplasm of parotid gland: Secondary | ICD-10-CM | POA: Diagnosis not present

## 2023-11-26 DIAGNOSIS — M064 Inflammatory polyarthropathy: Secondary | ICD-10-CM | POA: Diagnosis present

## 2023-11-26 DIAGNOSIS — Z91011 Allergy to milk products, unspecified: Secondary | ICD-10-CM

## 2023-11-26 DIAGNOSIS — Z85020 Personal history of malignant carcinoid tumor of stomach: Secondary | ICD-10-CM | POA: Diagnosis not present

## 2023-11-26 DIAGNOSIS — Z9841 Cataract extraction status, right eye: Secondary | ICD-10-CM

## 2023-11-26 DIAGNOSIS — Z91048 Other nonmedicinal substance allergy status: Secondary | ICD-10-CM

## 2023-11-26 DIAGNOSIS — J45909 Unspecified asthma, uncomplicated: Secondary | ICD-10-CM | POA: Diagnosis present

## 2023-11-26 DIAGNOSIS — Z7951 Long term (current) use of inhaled steroids: Secondary | ICD-10-CM

## 2023-11-26 DIAGNOSIS — Z974 Presence of external hearing-aid: Secondary | ICD-10-CM | POA: Diagnosis not present

## 2023-11-26 DIAGNOSIS — F32A Depression, unspecified: Secondary | ICD-10-CM | POA: Diagnosis present

## 2023-11-26 DIAGNOSIS — R591 Generalized enlarged lymph nodes: Secondary | ICD-10-CM | POA: Diagnosis not present

## 2023-11-26 DIAGNOSIS — Z79899 Other long term (current) drug therapy: Secondary | ICD-10-CM

## 2023-11-26 DIAGNOSIS — Z903 Acquired absence of stomach [part of]: Secondary | ICD-10-CM | POA: Diagnosis not present

## 2023-11-26 DIAGNOSIS — Z888 Allergy status to other drugs, medicaments and biological substances status: Secondary | ICD-10-CM

## 2023-11-26 DIAGNOSIS — Z90722 Acquired absence of ovaries, bilateral: Secondary | ICD-10-CM

## 2023-11-26 DIAGNOSIS — E66813 Obesity, class 3: Secondary | ICD-10-CM | POA: Diagnosis not present

## 2023-11-26 DIAGNOSIS — Z9089 Acquired absence of other organs: Secondary | ICD-10-CM

## 2023-11-26 DIAGNOSIS — M069 Rheumatoid arthritis, unspecified: Secondary | ICD-10-CM | POA: Diagnosis present

## 2023-11-26 DIAGNOSIS — Z905 Acquired absence of kidney: Secondary | ICD-10-CM

## 2023-11-26 DIAGNOSIS — K118 Other diseases of salivary glands: Secondary | ICD-10-CM | POA: Diagnosis not present

## 2023-11-26 DIAGNOSIS — Z01818 Encounter for other preprocedural examination: Secondary | ICD-10-CM

## 2023-11-26 DIAGNOSIS — K754 Autoimmune hepatitis: Secondary | ICD-10-CM | POA: Diagnosis present

## 2023-11-26 DIAGNOSIS — Z881 Allergy status to other antibiotic agents status: Secondary | ICD-10-CM

## 2023-11-26 DIAGNOSIS — Z91040 Latex allergy status: Secondary | ICD-10-CM

## 2023-11-26 DIAGNOSIS — Z860101 Personal history of adenomatous and serrated colon polyps: Secondary | ICD-10-CM | POA: Diagnosis not present

## 2023-11-26 DIAGNOSIS — Z9889 Other specified postprocedural states: Secondary | ICD-10-CM

## 2023-11-26 DIAGNOSIS — K219 Gastro-esophageal reflux disease without esophagitis: Secondary | ICD-10-CM | POA: Diagnosis present

## 2023-11-26 DIAGNOSIS — Z9049 Acquired absence of other specified parts of digestive tract: Secondary | ICD-10-CM

## 2023-11-26 DIAGNOSIS — M109 Gout, unspecified: Secondary | ICD-10-CM | POA: Diagnosis present

## 2023-11-26 DIAGNOSIS — Z9842 Cataract extraction status, left eye: Secondary | ICD-10-CM

## 2023-11-26 DIAGNOSIS — Z85528 Personal history of other malignant neoplasm of kidney: Secondary | ICD-10-CM

## 2023-11-26 DIAGNOSIS — M353 Polymyalgia rheumatica: Secondary | ICD-10-CM | POA: Diagnosis present

## 2023-11-26 DIAGNOSIS — Z880 Allergy status to penicillin: Secondary | ICD-10-CM

## 2023-11-26 DIAGNOSIS — G4730 Sleep apnea, unspecified: Secondary | ICD-10-CM | POA: Diagnosis present

## 2023-11-26 DIAGNOSIS — Z886 Allergy status to analgesic agent status: Secondary | ICD-10-CM

## 2023-11-26 DIAGNOSIS — Z7984 Long term (current) use of oral hypoglycemic drugs: Secondary | ICD-10-CM

## 2023-11-26 DIAGNOSIS — Z87820 Personal history of traumatic brain injury: Secondary | ICD-10-CM

## 2023-11-26 DIAGNOSIS — Z90710 Acquired absence of both cervix and uterus: Secondary | ICD-10-CM

## 2023-11-26 DIAGNOSIS — Z9851 Tubal ligation status: Secondary | ICD-10-CM

## 2023-11-26 HISTORY — PX: PAROTIDECTOMY: SHX2163

## 2023-11-26 HISTORY — PX: LYMPH NODE BIOPSY: SHX201

## 2023-11-26 LAB — GLUCOSE, CAPILLARY
Glucose-Capillary: 118 mg/dL — ABNORMAL HIGH (ref 70–99)
Glucose-Capillary: 166 mg/dL — ABNORMAL HIGH (ref 70–99)
Glucose-Capillary: 165 mg/dL — ABNORMAL HIGH (ref 70–99)

## 2023-11-26 SURGERY — EXCISION, PAROTID GLAND
Anesthesia: General | Laterality: Left

## 2023-11-26 MED ORDER — INSULIN ASPART 100 UNIT/ML IJ SOLN: 0.0000 [IU] | Freq: Three times a day (TID) | INTRAMUSCULAR | Status: DC

## 2023-11-26 MED ORDER — BACITRACIN ZINC 500 UNIT/GM EX OINT
1.0000 | TOPICAL_OINTMENT | Freq: Two times a day (BID) | CUTANEOUS | Status: DC
  Administered 2023-11-26 – 2023-11-27 (×2): 1 via TOPICAL

## 2023-11-26 MED ORDER — SUCCINYLCHOLINE CHLORIDE 200 MG/10ML IV SOSY
PREFILLED_SYRINGE | INTRAVENOUS | Status: AC
  Filled 2023-11-26: qty 10

## 2023-11-26 MED ORDER — ACETAMINOPHEN 500 MG PO TABS
1000.0000 mg | ORAL_TABLET | Freq: Once | ORAL | Status: AC
  Administered 2023-11-26: 1000 mg via ORAL
  Filled 2023-11-26: qty 2

## 2023-11-26 MED ORDER — MIDAZOLAM HCL 2 MG/2ML IJ SOLN
INTRAMUSCULAR | Status: DC | PRN
  Administered 2023-11-26: 2 mg via INTRAVENOUS

## 2023-11-26 MED ORDER — PROPOFOL 10 MG/ML IV BOLUS
INTRAVENOUS | Status: AC
  Filled 2023-11-26: qty 20

## 2023-11-26 MED ORDER — FENTANYL CITRATE (PF) 250 MCG/5ML IJ SOLN
INTRAMUSCULAR | Status: DC | PRN
  Administered 2023-11-26: 50 ug via INTRAVENOUS
  Administered 2023-11-26: 100 ug via INTRAVENOUS
  Administered 2023-11-26 (×3): 50 ug via INTRAVENOUS

## 2023-11-26 MED ORDER — PHENYLEPHRINE 80 MCG/ML (10ML) SYRINGE FOR IV PUSH (FOR BLOOD PRESSURE SUPPORT)
PREFILLED_SYRINGE | INTRAVENOUS | Status: AC
Start: 2023-11-26 — End: 2023-11-26
  Filled 2023-11-26: qty 10

## 2023-11-26 MED ORDER — CARVEDILOL 6.25 MG PO TABS
6.2500 mg | ORAL_TABLET | Freq: Two times a day (BID) | ORAL | Status: DC
  Administered 2023-11-26 – 2023-11-27 (×2): 6.25 mg via ORAL
  Filled 2023-11-26 (×2): qty 1

## 2023-11-26 MED ORDER — LACTATED RINGERS IV SOLN: INTRAVENOUS | Status: DC

## 2023-11-26 MED ORDER — AMLODIPINE BESYLATE 5 MG PO TABS
5.0000 mg | ORAL_TABLET | Freq: Every day | ORAL | Status: DC
  Administered 2023-11-27: 5 mg via ORAL
  Filled 2023-11-26: qty 1

## 2023-11-26 MED ORDER — CEFAZOLIN SODIUM-DEXTROSE 2-3 GM-%(50ML) IV SOLR
INTRAVENOUS | Status: DC | PRN
  Administered 2023-11-26 (×2): 3 g via INTRAVENOUS

## 2023-11-26 MED ORDER — ENOXAPARIN SODIUM 40 MG/0.4ML IJ SOSY
40.0000 mg | PREFILLED_SYRINGE | INTRAMUSCULAR | Status: DC
  Filled 2023-11-26: qty 0.4

## 2023-11-26 MED ORDER — OXYCODONE HCL 5 MG/5ML PO SOLN
5.0000 mg | ORAL | Status: DC | PRN
  Administered 2023-11-26: 5 mg via ORAL
  Filled 2023-11-26: qty 5

## 2023-11-26 MED ORDER — OXYCODONE HCL 5 MG PO TABS: 5.0000 mg | ORAL_TABLET | Freq: Once | ORAL | Status: DC | PRN

## 2023-11-26 MED ORDER — 0.9 % SODIUM CHLORIDE (POUR BTL) OPTIME
TOPICAL | Status: DC | PRN
  Administered 2023-11-26: 1000 mL

## 2023-11-26 MED ORDER — PANTOPRAZOLE SODIUM 40 MG PO TBEC
40.0000 mg | DELAYED_RELEASE_TABLET | Freq: Two times a day (BID) | ORAL | Status: DC
  Administered 2023-11-26 – 2023-11-27 (×2): 40 mg via ORAL
  Filled 2023-11-26 (×2): qty 1

## 2023-11-26 MED ORDER — LIDOCAINE-EPINEPHRINE 1 %-1:100000 IJ SOLN
INTRAMUSCULAR | Status: DC | PRN
  Administered 2023-11-26: 10 mL

## 2023-11-26 MED ORDER — SERTRALINE HCL 100 MG PO TABS
200.0000 mg | ORAL_TABLET | Freq: Every day | ORAL | Status: DC
  Administered 2023-11-26: 200 mg via ORAL
  Filled 2023-11-26: qty 2

## 2023-11-26 MED ORDER — CHLORHEXIDINE GLUCONATE 0.12 % MT SOLN
15.0000 mL | Freq: Once | OROMUCOSAL | Status: AC
  Administered 2023-11-26: 15 mL via OROMUCOSAL
  Filled 2023-11-26: qty 15

## 2023-11-26 MED ORDER — ONDANSETRON HCL 4 MG PO TABS: 4.0000 mg | ORAL_TABLET | ORAL | Status: DC | PRN

## 2023-11-26 MED ORDER — ALLOPURINOL 300 MG PO TABS
300.0000 mg | ORAL_TABLET | Freq: Every day | ORAL | Status: DC
  Administered 2023-11-27: 300 mg via ORAL
  Filled 2023-11-26: qty 1

## 2023-11-26 MED ORDER — LIDOCAINE-EPINEPHRINE 1 %-1:100000 IJ SOLN
INTRAMUSCULAR | Status: AC
  Filled 2023-11-26: qty 1

## 2023-11-26 MED ORDER — FENTANYL CITRATE (PF) 250 MCG/5ML IJ SOLN
INTRAMUSCULAR | Status: AC
  Filled 2023-11-26: qty 5

## 2023-11-26 MED ORDER — PHENYLEPHRINE HCL-NACL 20-0.9 MG/250ML-% IV SOLN
INTRAVENOUS | Status: DC | PRN
Start: 2023-11-26 — End: 2023-11-26
  Administered 2023-11-26: 50 ug/min via INTRAVENOUS

## 2023-11-26 MED ORDER — CEFAZOLIN SODIUM-DEXTROSE 2-4 GM/100ML-% IV SOLN
INTRAVENOUS | Status: AC
  Filled 2023-11-26: qty 100

## 2023-11-26 MED ORDER — CEFAZOLIN SODIUM 1 G IJ SOLR
INTRAMUSCULAR | Status: AC
Start: 2023-11-26 — End: 2023-11-26
  Filled 2023-11-26: qty 10

## 2023-11-26 MED ORDER — LIDOCAINE 2% (20 MG/ML) 5 ML SYRINGE
INTRAMUSCULAR | Status: DC | PRN
  Administered 2023-11-26: 50 mg via INTRAVENOUS

## 2023-11-26 MED ORDER — ONDANSETRON HCL 4 MG/2ML IJ SOLN: 4.0000 mg | Freq: Once | INTRAMUSCULAR | Status: DC | PRN

## 2023-11-26 MED ORDER — HEMOSTATIC AGENTS (NO CHARGE) OPTIME
TOPICAL | Status: DC | PRN
  Administered 2023-11-26: 1 via TOPICAL

## 2023-11-26 MED ORDER — ACETAMINOPHEN 160 MG/5ML PO SOLN
650.0000 mg | ORAL | Status: DC
  Administered 2023-11-26 – 2023-11-27 (×3): 650 mg via ORAL
  Filled 2023-11-26 (×4): qty 20.3

## 2023-11-26 MED ORDER — ONDANSETRON HCL 4 MG/2ML IJ SOLN
INTRAMUSCULAR | Status: AC
  Filled 2023-11-26: qty 2

## 2023-11-26 MED ORDER — ONDANSETRON HCL 4 MG/2ML IJ SOLN
INTRAMUSCULAR | Status: AC
Start: 2023-11-26 — End: 2023-11-26
  Filled 2023-11-26: qty 2

## 2023-11-26 MED ORDER — MIDAZOLAM HCL 2 MG/2ML IJ SOLN
INTRAMUSCULAR | Status: AC
  Filled 2023-11-26: qty 2

## 2023-11-26 MED ORDER — OXYCODONE HCL 5 MG/5ML PO SOLN: 5.0000 mg | Freq: Once | ORAL | Status: DC | PRN

## 2023-11-26 MED ORDER — ALBUMIN HUMAN 5 % IV SOLN: INTRAVENOUS | Status: DC | PRN

## 2023-11-26 MED ORDER — KETAMINE HCL 50 MG/5ML IJ SOSY
PREFILLED_SYRINGE | INTRAMUSCULAR | Status: DC | PRN
Start: 2023-11-26 — End: 2023-11-26
  Administered 2023-11-26: 30 mg via INTRAVENOUS
  Administered 2023-11-26: 20 mg via INTRAVENOUS

## 2023-11-26 MED ORDER — KETAMINE HCL 50 MG/5ML IJ SOSY
PREFILLED_SYRINGE | INTRAMUSCULAR | Status: AC
  Filled 2023-11-26: qty 5

## 2023-11-26 MED ORDER — ONDANSETRON HCL 4 MG/2ML IJ SOLN
4.0000 mg | INTRAMUSCULAR | Status: DC | PRN
Start: 2023-11-26 — End: 2023-11-27

## 2023-11-26 MED ORDER — SENNA 8.6 MG PO TABS
1.0000 | ORAL_TABLET | Freq: Two times a day (BID) | ORAL | Status: DC
  Filled 2023-11-26 (×2): qty 1

## 2023-11-26 MED ORDER — ORAL CARE MOUTH RINSE
15.0000 mL | Freq: Once | OROMUCOSAL | Status: AC
Start: 2023-11-26 — End: 2023-11-26

## 2023-11-26 MED ORDER — INSULIN ASPART 100 UNIT/ML IJ SOLN: 0.0000 [IU] | INTRAMUSCULAR | Status: DC | PRN

## 2023-11-26 MED ORDER — LOSARTAN POTASSIUM 50 MG PO TABS
50.0000 mg | ORAL_TABLET | Freq: Every day | ORAL | Status: DC
  Administered 2023-11-26 – 2023-11-27 (×2): 50 mg via ORAL
  Filled 2023-11-26 (×2): qty 1

## 2023-11-26 MED ORDER — PROPOFOL 10 MG/ML IV BOLUS
INTRAVENOUS | Status: DC | PRN
  Administered 2023-11-26: 200 mg via INTRAVENOUS
  Administered 2023-11-26: 100 mg via INTRAVENOUS

## 2023-11-26 MED ORDER — SUCCINYLCHOLINE CHLORIDE 200 MG/10ML IV SOSY
PREFILLED_SYRINGE | INTRAVENOUS | Status: DC | PRN
Start: 2023-11-26 — End: 2023-11-26
  Administered 2023-11-26: 200 mg via INTRAVENOUS

## 2023-11-26 MED ORDER — BACITRACIN ZINC 500 UNIT/GM EX OINT
TOPICAL_OINTMENT | CUTANEOUS | Status: AC
  Filled 2023-11-26: qty 28.35

## 2023-11-26 MED ORDER — LIDOCAINE 2% (20 MG/ML) 5 ML SYRINGE
INTRAMUSCULAR | Status: AC
  Filled 2023-11-26: qty 5

## 2023-11-26 MED ORDER — DOCUSATE SODIUM 100 MG PO CAPS
100.0000 mg | ORAL_CAPSULE | Freq: Two times a day (BID) | ORAL | Status: DC
  Administered 2023-11-27: 100 mg via ORAL
  Filled 2023-11-26 (×2): qty 1

## 2023-11-26 MED ORDER — CEFAZOLIN SODIUM 1 G IJ SOLR
INTRAMUSCULAR | Status: AC
  Filled 2023-11-26: qty 30

## 2023-11-26 MED ORDER — DULOXETINE HCL 60 MG PO CPEP
60.0000 mg | ORAL_CAPSULE | Freq: Every day | ORAL | Status: DC
  Administered 2023-11-26: 60 mg via ORAL
  Filled 2023-11-26: qty 1

## 2023-11-26 MED ORDER — DEXAMETHASONE SOD PHOSPHATE PF 10 MG/ML IJ SOLN
INTRAMUSCULAR | Status: DC | PRN
  Administered 2023-11-26: 10 mg via INTRAVENOUS

## 2023-11-26 MED ORDER — FENTANYL CITRATE (PF) 100 MCG/2ML IJ SOLN: 25.0000 ug | INTRAMUSCULAR | Status: DC | PRN

## 2023-11-26 MED ORDER — BACITRACIN ZINC 500 UNIT/GM EX OINT
TOPICAL_OINTMENT | CUTANEOUS | Status: DC | PRN
  Administered 2023-11-26: 1 via TOPICAL

## 2023-11-26 MED ORDER — ONDANSETRON HCL 4 MG/2ML IJ SOLN
INTRAMUSCULAR | Status: DC | PRN
  Administered 2023-11-26 (×2): 4 mg via INTRAVENOUS

## 2023-11-26 MED ORDER — ACETAMINOPHEN 650 MG RE SUPP: 650.0000 mg | RECTAL | Status: DC

## 2023-11-26 SURGICAL SUPPLY — 65 items
ATTRACTOMAT 16X20 MAGNETIC DRP (DRAPES) IMPLANT
BAG COUNTER SPONGE SURGICOUNT (BAG) ×2 IMPLANT
BLADE SURG 15 STRL LF DISP TIS (BLADE) ×2 IMPLANT
CANISTER SUCTION 3000ML PPV (SUCTIONS) ×2 IMPLANT
CLIP TI MEDIUM 24 (CLIP) ×2 IMPLANT
CLIP TI WIDE RED SMALL 24 (CLIP) ×2 IMPLANT
CORD BIPOLAR FORCEPS 12FT (ELECTRODE) ×2 IMPLANT
COVER SURGICAL LIGHT HANDLE (MISCELLANEOUS) ×2 IMPLANT
DRAIN CHANNEL 19F RND (DRAIN) IMPLANT
DRAIN JP 10F RND RADIO (DRAIN) IMPLANT
DRAIN JP 15F RND RADIO PRF (DRAIN) IMPLANT
DRAIN PENROSE 0.25X18 (DRAIN) IMPLANT
DRAPE INCISE IOBAN 66X45 STRL (DRAPES) ×2 IMPLANT
DRAPE UTILITY XL STRL (DRAPES) IMPLANT
DRSG TEGADERM 2-3/8X2-3/4 SM (GAUZE/BANDAGES/DRESSINGS) ×6 IMPLANT
DRSG TEGADERM 4X4.75 (GAUZE/BANDAGES/DRESSINGS) ×6 IMPLANT
ELECT COATED BLADE 2.86 ST (ELECTRODE) ×2 IMPLANT
ELECTRODE PAIRED SUBDERMAL (MISCELLANEOUS) ×2 IMPLANT
ELECTRODE REM PT RTRN 9FT ADLT (ELECTROSURGICAL) ×2 IMPLANT
EVACUATOR SILICONE 100CC (DRAIN) IMPLANT
FORCEPS BIPOLAR SPETZLER 8 1.0 (NEUROSURGERY SUPPLIES) IMPLANT
GAUZE 4X4 16PLY ~~LOC~~+RFID DBL (SPONGE) IMPLANT
GLOVE BIO SURGEON STRL SZ 6.5 (GLOVE) ×2 IMPLANT
GLOVE BIO SURGEON STRL SZ7.5 (GLOVE) ×2 IMPLANT
GOWN STRL REUS W/ TWL LRG LVL3 (GOWN DISPOSABLE) ×2 IMPLANT
GOWN STRL REUS W/ TWL XL LVL3 (GOWN DISPOSABLE) ×2 IMPLANT
HEMOSTAT SNOW SURGICEL 2X4 (HEMOSTASIS) IMPLANT
HOOK RETRACT STAY BLUNT 12 (MISCELLANEOUS) IMPLANT
KIT BASIN OR (CUSTOM PROCEDURE TRAY) ×2 IMPLANT
KIT TURNOVER KIT B (KITS) ×2 IMPLANT
LOCATOR NERVE 3 VOLT (DISPOSABLE) IMPLANT
MARKER SKIN DUAL TIP RULER LAB (MISCELLANEOUS) ×2 IMPLANT
NDL HYPO 25GX1X1/2 BEV (NEEDLE) ×2 IMPLANT
NDL PRECISIONGLIDE 27X1.5 (NEEDLE) ×2 IMPLANT
NEEDLE HYPO 25GX1X1/2 BEV (NEEDLE) ×1 IMPLANT
NEEDLE PRECISIONGLIDE 27X1.5 (NEEDLE) ×1 IMPLANT
PAD ARMBOARD POSITIONER FOAM (MISCELLANEOUS) ×4 IMPLANT
PENCIL SMOKE EVACUATOR (MISCELLANEOUS) ×2 IMPLANT
POSITIONER HEAD DONUT 9IN (MISCELLANEOUS) ×2 IMPLANT
PROBE NERVBE PRASS .33 (MISCELLANEOUS) ×2 IMPLANT
RETRACTOR STAY HOOK 5MM (MISCELLANEOUS) ×2 IMPLANT
SET WALTER ACTIVATION W/DRAPE (SET/KITS/TRAYS/PACK) IMPLANT
SOLN 0.9% NACL 1000 ML (IV SOLUTION) ×1 IMPLANT
SOLN 0.9% NACL POUR BTL 1000ML (IV SOLUTION) ×2 IMPLANT
SOLN STERILE WATER 1000 ML (IV SOLUTION) ×1 IMPLANT
SOLN STERILE WATER BTL 1000 ML (IV SOLUTION) ×2 IMPLANT
SPONGE INTESTINAL PEANUT (DISPOSABLE) ×2 IMPLANT
STAPLER SKIN PROX 35W (STAPLE) IMPLANT
SUT ETHILON 3 0 PS 1 (SUTURE) IMPLANT
SUT ETHILON 5 0 PS 2 18 (SUTURE) IMPLANT
SUT MNCRL AB 4-0 PS2 18 (SUTURE) IMPLANT
SUT PLAIN GUT FAST 5-0 (SUTURE) ×2 IMPLANT
SUT PROLENE 5 0 P 3 (SUTURE) IMPLANT
SUT SILK 2 0 REEL (SUTURE) ×2 IMPLANT
SUT SILK 2 0 SH CR/8 (SUTURE) ×2 IMPLANT
SUT SILK 3 0 REEL (SUTURE) ×2 IMPLANT
SUT SILK 3 0 SH CR/8 (SUTURE) ×2 IMPLANT
SUT VIC AB 3-0 SH 8-18 (SUTURE) IMPLANT
SUT VIC AB 4-0 PS2 18 (SUTURE) IMPLANT
SUT VIC AB 4-0 RB1 18 (SUTURE) ×2 IMPLANT
SUT VIC AB 4-0 SH 18 (SUTURE) IMPLANT
SUT VIC AB 5-0 PS2 18 (SUTURE) IMPLANT
TOWEL GREEN STERILE FF (TOWEL DISPOSABLE) ×2 IMPLANT
TRAY ENT MC OR (CUSTOM PROCEDURE TRAY) ×2 IMPLANT
TRAY FOLEY MTR SLVR 14FR STAT (SET/KITS/TRAYS/PACK) IMPLANT
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
## 2023-11-26 NOTE — Op Note (Signed)
 Otolaryngology Operative note  [name] Date/Time of Admission: 11/26/2023 10:40 AM  CSN: [id number];MRN:[id number]  DOB: 1956-01-16 Age: 68 y.o. Location: [facility]    Pre-Op Diagnosis: Left Parotid Mass Left Neck Lymphadenopathy  Post-Op Diagnosis: Same  Procedure: Left deep lobe parotidectomy with dissection and preservation of Facial Nerve (CPT 42420) Left Deep Neck Excisional Lymph Node Biopsy (CPT 38510)  Surgeon: Eldora Blanch, MD  Anesthesia type:  Choice  Anesthesiologist: Anesthesiologist: Lucious Debby BRAVO, MD CRNA: Tressie Gilmore RAMAN, CRNA; Viviana Almarie DASEN, CRNA; Obadiah Reyes BROCKS, CRNA   Staff: Circulator: Veda Verla SAILOR, RN Relief Circulator: Sherrine Moats, RN Relief Scrub: Sherrine Moats, RN Scrub Person: Welford Stevenson NOVAK, RN RN First Assistant: Sherrine Moats, RN  Specimens: ID Type Source Tests Collected by Time Destination  1 : left parotid mass Tissue PATH ENT excision SURGICAL PATHOLOGY Blanch Eldora NOVAK, MD 11/26/2023 1410   2 : left parotid mass 2 Tissue PATH ENT excision SURGICAL PATHOLOGY Blanch Eldora NOVAK, MD 11/26/2023 1502     EBL: 50cc  Drains: 15 Fr suction drain  Post-op disposition and condition: PACU, hemodynamically stable  Findings: Left encapsulated deep lobe parotid mass just deep to the retromandibular vein, which was excised entirely; facial nerve midface branches visualized and noted to be intact at end of procedure --- frozen section reported benign (basal cell adenoma likely) Left neck Level 2 lymph node, excised --- frozen section reported benign Facial nerve wwas stimulating at 0.47mA at end of procedure  Complications: None apparent  Indications and consent:  [name] is a 68 y.o. female with diagnoses above. The patient's options were discussed, including risks/benefits/alternatives for each option. Patient expressed understanding, and despite these risks, consented and decided to proceed with above  procedures. Informed consent was signed before proceeding.  OPERATIVE DETAILS:  After being properly identified in the preoperative holding area, the patient was brought into the operating suite.  She was placed supine on the operating table.  A pre-procedural time-out was performed. The anesthesia team successfully induced General anesthesia. Peri-procedural antibiotics and steroids were given. A shoulder roll was placed. The nerve integrity monitor was placed on this patient and its integrity was verified. The patient was then prepped and draped in usual sterile fashion.   A modified Blair incision was marked with a marking pen overlying a skin crease. An incision was then made with a #15 blade. The skin and subcutaneous tissues were divided and the superficial parotid fascia was identified. An anterior parotid skin flap was also elevated. Next, dissection was carried inferiorly until sternocleidomastoid attachments to skin were identified. The sternocleidomastoid muscle was then skeletonized at its anterior border and separated from the overlying parotid tail. The great auricular nerve was identified and sacrificed. Dissection was then performed superiorly along the sternocleidomastoid muscle up to the mastoid tip. The digastric muscle was identified as well. Over level ~1B, internal jugular vein was noted during identification of the posterior belly of digastric and just inferior to it, there were two lymph nodes were were also noted on CT. These were first excised and sent for frozen section given persistence on multiple CT. Frozen section returned as benign lymph nodes. We then proceeded superiorly, identifying and dissecting facial nerve identification landmarks, dissecting down the tragal cartilage and elevating the gland anteriorly.  The tympanic ring was palpated deep to the tragal pointer. Blunt dissection with a McCabe dissector was then performed along the tympanomastoid suture, parallel to the  direction of the facial nerve. The main trunk  of the facial nerve was ultimately identified and confirmed with stimulation with the Prass probe.   Next, the nerve was traced distally to the pes. The mass was noted to be inferior based, so superior division of nerve was not dissected. The inferior division was initially followed and the overlying superficial lobe and parotid was gently divided taking care to protect the nerve as we proceeded. The retromandibular vein was noted on the deep aspect and the lesion was located deep to the vein. Branches of the vein were ligated to improve visualization of the mass. After following the inferior division of the nerve until adequate exposure of mass was obtained, the mass was gently and circumferentially dissected away from the nerve and freed. The mass was then sent for frozen section with findings above. Deep lobe tissue was removed as well from between lower division nerves.   Throughout dissection, hemostasis was achieved with the bipolar electrocautery and was satisfactory, as confirmed by valsalva maneuver. The wound was irrigated. The Prass probe was used to stimulate the main trunk of the facial nerve and both divisions stimulated. Suricel SNoW was placed in the wound bed.  A 15 Fr suction drain was placed into the wound bed and brought out of the skin. 3-0 vicryl interrupted were used to re-approximate the platysma. Interrupted 4-0 Vicryls were used to close subcutaneous layers. A running 5-0 fast-absorbing gut suture was used to close the skin. Bacitracin ointment was applied. The patient was then awoken from anesthesia, extubated, and transported to PACU in stable condition without complications.     [name] [name]

## 2023-11-26 NOTE — Anesthesia Procedure Notes (Signed)
 Procedure Name: Intubation Date/Time: 11/26/2023 12:35 PM  Performed by: Tressie Gilmore RAMAN, CRNAPre-anesthesia Checklist: Patient identified, Emergency Drugs available, Suction available and Patient being monitored Patient Re-evaluated:Patient Re-evaluated prior to induction Oxygen Delivery Method: Circle System Utilized Preoxygenation: Pre-oxygenation with 100% oxygen Induction Type: IV induction Ventilation: Mask ventilation without difficulty, Mask ventilation with difficulty and Oral airway inserted - appropriate to patient size Laryngoscope Size: Glidescope and 3 Grade View: Grade II Tube type: Oral Number of attempts: 1 Airway Equipment and Method: Stylet and Oral airway Placement Confirmation: ETT inserted through vocal cords under direct vision, positive ETCO2 and breath sounds checked- equal and bilateral Secured at: 20 cm Tube secured with: Tape Dental Injury: Teeth and Oropharynx as per pre-operative assessment  Difficulty Due To: Difficulty was anticipated, Difficult Airway- due to limited oral opening and Difficult Airway- due to reduced neck mobility Comments: No dentyion traum during DL but lower lip split with hand ventilation with oral airway.

## 2023-11-26 NOTE — Transfer of Care (Signed)
 Immediate Anesthesia Transfer of Care Note  Patient: [name]  Procedure(s) Performed: LEFT DEEP LOBE PAROTIDECTOMY (Left) Left Deep Neck Excisional Lymph Node Biopsy (Left)  Patient Location: PACU  Anesthesia Type:General  Level of Consciousness: awake, alert , and oriented  Airway & Oxygen Therapy: Patient Spontanous Breathing and Patient connected to nasal cannula oxygen  Post-op Assessment: Report given to RN and Post -op Vital signs reviewed and stable  Post vital signs: Reviewed  Last Vitals:  Vitals Value Taken Time  BP 147/86 11/26/23 16:25  Temp    Pulse 96 11/26/23 16:29  Resp 16 11/26/23 16:29  SpO2 92 % 11/26/23 16:29  Vitals shown include unfiled device data.  Last Pain:  Vitals:   11/26/23 1118  TempSrc:   PainSc: 0-No pain         Complications: No notable events documented.

## 2023-11-26 NOTE — Plan of Care (Signed)
   Problem: Education: Goal: Knowledge of General Education information will improve Description: Including pain rating scale, medication(s)/side effects and non-pharmacologic comfort measures Outcome: Progressing   Problem: Health Behavior/Discharge Planning: Goal: Ability to manage health-related needs will improve Outcome: Progressing   Problem: Clinical Measurements: Goal: Ability to maintain clinical measurements within normal limits will improve Outcome: Progressing Goal: Will remain free from infection Outcome: Progressing Goal: Diagnostic test results will improve Outcome: Progressing Goal: Respiratory complications will improve Outcome: Progressing Goal: Cardiovascular complication will be avoided Outcome: Progressing   Problem: Activity: Goal: Risk for activity intolerance will decrease Outcome: Progressing   Problem: Nutrition: Goal: Adequate nutrition will be maintained Outcome: Progressing   Problem: Coping: Goal: Level of anxiety will decrease Outcome: Progressing   Problem: Elimination: Goal: Will not experience complications related to bowel motility Outcome: Progressing Goal: Will not experience complications related to urinary retention Outcome: Progressing   Problem: Pain Managment: Goal: General experience of comfort will improve and/or be controlled Outcome: Progressing   Problem: Safety: Goal: Ability to remain free from injury will improve Outcome: Progressing   Problem: Skin Integrity: Goal: Risk for impaired skin integrity will decrease Outcome: Progressing   Problem: Education: Goal: Knowledge of the prescribed therapeutic regimen will improve Outcome: Progressing   Problem: Activity: Goal: Ability to tolerate increased activity will improve Outcome: Progressing   Problem: Health Behavior/Discharge Planning: Goal: Identification of resources available to assist in meeting health care needs will improve Outcome: Progressing    Problem: Nutrition: Goal: Maintenance of adequate nutrition will improve Outcome: Progressing   Problem: Clinical Measurements: Goal: Complications related to the disease process, condition or treatment will be avoided or minimized Outcome: Progressing   Problem: Respiratory: Goal: Will regain and/or maintain adequate ventilation Outcome: Progressing   Problem: Skin Integrity: Goal: Demonstration of wound healing without infection will improve Outcome: Progressing

## 2023-11-27 ENCOUNTER — Encounter (HOSPITAL_COMMUNITY): Payer: Self-pay | Admitting: Otolaryngology

## 2023-11-27 LAB — SURGICAL PATHOLOGY

## 2023-11-27 LAB — GLUCOSE, CAPILLARY
Glucose-Capillary: 179 mg/dL — ABNORMAL HIGH (ref 70–99)
Glucose-Capillary: 202 mg/dL — ABNORMAL HIGH (ref 70–99)

## 2023-11-27 MED ORDER — CLINDAMYCIN HCL 300 MG PO CAPS: 300.0000 mg | ORAL_CAPSULE | Freq: Three times a day (TID) | ORAL | 0 refills | Status: AC

## 2023-11-27 MED ORDER — BACITRACIN 500 UNIT/GM EX OINT: 1.0000 | TOPICAL_OINTMENT | Freq: Two times a day (BID) | CUTANEOUS | 0 refills | Status: AC

## 2023-11-27 MED ORDER — ACETAMINOPHEN 500 MG PO TABS: 1000.0000 mg | ORAL_TABLET | Freq: Four times a day (QID) | ORAL | 2 refills | Status: DC | PRN

## 2023-11-27 MED ORDER — OXYCODONE HCL 5 MG PO TABS: 5.0000 mg | ORAL_TABLET | ORAL | 0 refills | Status: AC | PRN

## 2023-11-27 NOTE — Anesthesia Postprocedure Evaluation (Signed)
 Anesthesia Post Note  Patient: [name]  Procedure(s) Performed: LEFT DEEP LOBE PAROTIDECTOMY (Left) Left Deep Neck Excisional Lymph Node Biopsy (Left)     Patient location during evaluation: PACU Anesthesia Type: General Level of consciousness: awake and alert Pain management: pain level controlled Vital Signs Assessment: post-procedure vital signs reviewed and stable Respiratory status: spontaneous breathing, nonlabored ventilation, respiratory function stable and patient connected to nasal cannula oxygen Cardiovascular status: blood pressure returned to baseline and stable Postop Assessment: no apparent nausea or vomiting Anesthetic complications: no   No notable events documented.  Last Vitals:  Vitals:   11/26/23 2359 11/27/23 0713  BP: 128/63 124/63  Pulse: 81 72  Resp: 18 17  Temp: (!) 36.3 C 36.8 C  SpO2: 100% 93%    Last Pain:  Vitals:   11/27/23 0713  TempSrc: Oral  PainSc:    Pain Goal:                   Debby FORBES Like

## 2023-11-27 NOTE — Plan of Care (Signed)
  Problem: Education: Goal: Knowledge of General Education information will improve Description: Including pain rating scale, medication(s)/side effects and non-pharmacologic comfort measures Outcome: Adequate for Discharge   Problem: Health Behavior/Discharge Planning: Goal: Ability to manage health-related needs will improve Outcome: Adequate for Discharge   Problem: Clinical Measurements: Goal: Ability to maintain clinical measurements within normal limits will improve Outcome: Adequate for Discharge Goal: Will remain free from infection Outcome: Adequate for Discharge Goal: Diagnostic test results will improve Outcome: Adequate for Discharge Goal: Respiratory complications will improve Outcome: Adequate for Discharge Goal: Cardiovascular complication will be avoided Outcome: Adequate for Discharge   Problem: Activity: Goal: Risk for activity intolerance will decrease Outcome: Adequate for Discharge   Problem: Nutrition: Goal: Adequate nutrition will be maintained Outcome: Adequate for Discharge   Problem: Coping: Goal: Level of anxiety will decrease Outcome: Adequate for Discharge   Problem: Elimination: Goal: Will not experience complications related to bowel motility Outcome: Adequate for Discharge Goal: Will not experience complications related to urinary retention Outcome: Adequate for Discharge   Problem: Pain Managment: Goal: General experience of comfort will improve and/or be controlled Outcome: Adequate for Discharge   Problem: Safety: Goal: Ability to remain free from injury will improve Outcome: Adequate for Discharge   Problem: Skin Integrity: Goal: Risk for impaired skin integrity will decrease Outcome: Adequate for Discharge   Problem: Education: Goal: Knowledge of the prescribed therapeutic regimen will improve Outcome: Adequate for Discharge   Problem: Activity: Goal: Ability to tolerate increased activity will improve Outcome: Adequate for  Discharge   Problem: Health Behavior/Discharge Planning: Goal: Identification of resources available to assist in meeting health care needs will improve Outcome: Adequate for Discharge   Problem: Nutrition: Goal: Maintenance of adequate nutrition will improve Outcome: Adequate for Discharge   Problem: Clinical Measurements: Goal: Complications related to the disease process, condition or treatment will be avoided or minimized Outcome: Adequate for Discharge   Problem: Respiratory: Goal: Will regain and/or maintain adequate ventilation Outcome: Adequate for Discharge   Problem: Skin Integrity: Goal: Demonstration of wound healing without infection will improve Outcome: Adequate for Discharge   Problem: Education: Goal: Ability to describe self-care measures that may prevent or decrease complications (Diabetes Survival Skills Education) will improve Outcome: Adequate for Discharge Goal: Individualized Educational Video(s) Outcome: Adequate for Discharge   Problem: Coping: Goal: Ability to adjust to condition or change in health will improve Outcome: Adequate for Discharge   Problem: Fluid Volume: Goal: Ability to maintain a balanced intake and output will improve Outcome: Adequate for Discharge   Problem: Health Behavior/Discharge Planning: Goal: Ability to identify and utilize available resources and services will improve Outcome: Adequate for Discharge Goal: Ability to manage health-related needs will improve Outcome: Adequate for Discharge   Problem: Metabolic: Goal: Ability to maintain appropriate glucose levels will improve Outcome: Adequate for Discharge   Problem: Nutritional: Goal: Maintenance of adequate nutrition will improve Outcome: Adequate for Discharge Goal: Progress toward achieving an optimal weight will improve Outcome: Adequate for Discharge   Problem: Skin Integrity: Goal: Risk for impaired skin integrity will decrease Outcome: Adequate for  Discharge   Problem: Tissue Perfusion: Goal: Adequacy of tissue perfusion will improve Outcome: Adequate for Discharge

## 2023-11-27 NOTE — Progress Notes (Signed)
 Removed IV-CDI. Reviewed drain teaching with patient and husband. Teach back down. Stable patient and belongings wheeled down to main entrance and picked up by her husband.

## 2023-11-27 NOTE — Discharge Instructions (Signed)
   Surgery Discharge Instructions:  Call clinic or return to ED if you: - develop a fever greater than 101.4 - have shaking chills or are feeling ill - become short of breath - have uncontrollable nausea or vomiting - can't hold down food or liquids or feel as though you are getting dehydrated - have leakage or drainage from wound - urine output of less than 30cc/hr for 12 hours - develop redness, pain at incision(s) or wound opens up/separates - any other acute events, problems, or concerns  Wound Care/Dressings/Drain Instructions:  - To take care of your incision/cut:  - Apply bacitracin ointment to the incision twice per day for 7 days. Then switch to vaseline. - It is ok to shower but avoid getting the cut/incision wet until you see Dr. Tobie - Your stitches are absorbable and will dissolve on their own  - Take care of the drain and record how much comes out as the nurse has shown you   Medications: - Resume your regular home medications except as detailed in the medication reconciliation.  - For pain, take tylenol  1000mg  every 6 hours as needed. If that is not sufficient, a stronger pain medication has been prescribed to you (oxycodone  - 5mg  every 4 to 6 hours as needed). Do not mix with any other narcotic medication. - To prevent infection, take keflex 500mg  twice daily for 7 days  Follow Up:  - A follow up appointment should be scheduled for you after discharge  Activity/Restrictions:  - Resume your regular activities, as tolerated. Avoid heavy lifting or straining (more than 5 lbs) for 7 days.  Diet: - Resume your regular diet, as tolerated  Additional Instructions: - Please take an over the counter stool softener while taking narcotic pain medication - DO NOT MIX NARCOTIC PAIN MEDICATIONS OR TAKE NARCOTIC PRESCRIPTIONS AT THE SAME TIME - DO NOT DRIVE OR OPERATE HEAVY MACHINERY WHILE ON NARCOTICS  - DO NOT TAKE MORE THAN 4 GRAMS (4000mg ) OF TYLENOL  (ACETAMINOPHEN ) IN  24 HOURS

## 2023-11-27 NOTE — TOC Transition Note (Signed)
 Transition of Care [facility]) - Discharge Note   Patient Details  Name: [name] MRN: [id number] Date of Birth: [birth year]  [facility]) CM/SW Contact:  Waddell Barnie Rama, RN Phone Number: 11/27/2023, 8:51 AM   Clinical Narrative:    For dc today. Has no needs. Spouse at bedside to transport home.         Patient Goals and CMS Choice            Discharge Placement                       Discharge Plan and Services Additional resources added to the After Visit Summary for                                       Social Drivers of Health (SDOH) Interventions SDOH Screenings   Food Insecurity: No Food Insecurity (11/26/2023)  Housing: Low Risk  (11/26/2023)  Transportation Needs: No Transportation Needs (11/26/2023)  Utilities: Not At Risk (11/26/2023)  Alcohol  Screen: Low Risk  (11/10/2023)  Depression (PHQ2-9): Low Risk  (11/12/2023)  Financial Resource Strain: Low Risk  (11/10/2023)  Physical Activity: Insufficiently Active (11/10/2023)  Social Connections: Moderately Isolated (11/26/2023)  Stress: Stress Concern Present (11/10/2023)  Tobacco Use: Low Risk  (11/26/2023)  Health Literacy: Adequate Health Literacy (11/15/2022)     Readmission Risk Interventions    11/27/2023    8:48 AM  Readmission Risk Prevention Plan  Medication Screening Complete  Transportation Screening Complete

## 2023-11-27 NOTE — TOC CM/SW Note (Signed)
 Transition of Care [facility]) - Inpatient Brief Assessment   Patient Details  Name: [name] MRN: [id number] Date of Birth: Jul 08, 1955  [facility]) CM/SW Contact:    Waddell Barnie Rama, RN Phone Number: 11/27/2023, 8:50 AM   Clinical Narrative: From home with spouse, has PCP and insurance on file, states has no HH services in place at this time or DME at home.  States family member will transport them home at [employer] and family is support system,   Pta self ambulatory.   There are no ICM needs identified  at this time.  Please place consult for ICM needs.     Transition of Care Asessment: Insurance and Status: Insurance coverage has been reviewed Patient has primary care physician: Yes Home environment has been reviewed: home with spouse Prior level of function:: indep Prior/Current Home Services: No current home services Social Drivers of Health Review: SDOH reviewed no interventions necessary Readmission risk has been reviewed: Yes Transition of care needs: no transition of care needs at this time

## 2023-11-28 NOTE — Discharge Summary (Signed)
 Physician Discharge Summary  Patient ID: [name] MRN: [id number] DOB/AGE: [birth year] 67 y.o.  Admit date: 11/26/2023 Discharge date: 11/27/2023  Admission Diagnoses:  Principal Problem:   Parotid mass Active Problems:   Cervical lymphadenopathy   Discharge Diagnoses:  Same  Surgeries: Procedure(s): LEFT DEEP LOBE PAROTIDECTOMY Left Deep Neck Excisional Lymph Node Biopsy on 11/26/2023   Consultants: None  Discharged Condition: Improved  Hospital Course: [name] is an 68 y.o. female who was admitted 11/26/2023 after they were brought to the operating room on 11/26/2023 and underwent the above named procedures. She was admitted on the floor and had met her milestones on POD 1. Facial nerve was intact. After discussion, joint decision was made for discharge.  Physical Exam:  General: Awake and alert, no acute distress Neck: c/d/I, soft, flat; drain with bloody output CN 7 function intact (Hb 1/6) - bilaterally Respiratory: Respiratory effort is normal. Lungs clear to auscultation.  Recent vital signs:  Vitals:   11/26/23 2359 11/27/23 0713  BP: 128/63 124/63  Pulse: 81 72  Resp: 18 17  Temp: (!) 97.4 F (36.3 C) 98.3 F (36.8 C)  SpO2: 100% 93%    Recent laboratory studies:  Results for orders placed or performed during the hospital encounter of 11/26/23  Glucose, capillary   Collection Time: 11/26/23 10:55 AM  Result Value Ref Range   Glucose-Capillary 118 (H) 70 - 99 mg/dL  Surgical pathology   Collection Time: 11/26/23  2:10 PM  Result Value Ref Range   SURGICAL PATHOLOGY      SURGICAL PATHOLOGY CASE: [id number] PATIENT: MONTIE SOMERSET Surgical Pathology Report     Clinical History: parotid mass (cm)     FINAL MICROSCOPIC DIAGNOSIS:  A. PAROTID MASS, LEFT, EXCISION: - Benign reactive lymph node, negative for carcinoma  B. PAROTID MASS, LEFT, EXCISION: - Basal cell adenoma (monomorphic adenoma) 0.5 cm - Tumor appears  completely excised      INTRAOPERATIVE DIAGNOSIS:  A.  Left parotid mass: 2 benign lymph nodes, negative for carcinoma Intraoperative diagnosis rendered by Dr. Reed at 2:30 PM on 11/26/2023.  B.  Left parotid mass #2: Basal cell adenoma (monomorphic adenoma), completely excised Intraoperative diagnosis rendered by Dr. Reed at 3:20 PM on 11/26/2023.  GROSS DESCRIPTION:  A: The specimen is received fresh for rapid intraoperative consultation, and consists of 2 tan-red possible lymph nodes, measuring 1.0 x 0.7 x 0.6 cm and 1.7 x 1.0 x 0.8 cm.  The possible lymph nodes are bisected, and o ne half of each possible node is submitted for frozen section analysis.  The specimen is entirely submitted in 2 cassettes. 1 = frozen section remnant 2 = remainder of specimen  B: The specimen is received fresh for rapid intraoperative consultation and consists of a 1.5 x 1.5 x 0.3 cm nodular portion of tan-red soft tissue.  The specimen is trisected, to reveal a tan-white, glistening, slightly lobulated cut surface.  Cut surface abuts all margins. Representative section is submitted for frozen section analysis.  The specimen is entirely submitted in 3 cassettes. 1 = frozen section remnant 2 and 3 = remainder of specimen (KL 11/26/2023)   Final Diagnosis performed by Katrine Muskrat, MD.   Electronically signed 11/27/2023 Technical component performed at [facility]. [facility], [address] [place], [place] [address].  Professional component performed at [facility], [address], [place], [place] [address].  Immunohistochemistry Te chnical component (if applicable) was performed at [facility]. [address], [place], [place] [address].   IMMUNOHISTOCHEMISTRY DISCLAIMER (if applicable): Some of these immunohistochemical stains may have been developed and the performance characteristics determine by [facility]. Some may not have been cleared or approved by the U.S. Food and Drug Administration. The FDA has determined that such clearance or approval is not necessary. This test is used for clinical purposes. It should not be regarded as investigational or for research. This laboratory is certified under the Clinical Laboratory Improvement Amendments of 1988 (CLIA-88) as qualified to perform high complexity clinical laboratory testing.  The controls stained appropriately.   IHC stains are performed on formalin fixed, paraffin embedded tissue using a 3,3diaminobenzidine (DAB) chromogen and Leica Bond Autostainer System. The staining intensity of the nucleus is  score manually and is reported as the percentage of tumor cell nuclei demonstrating specific nuclear staining. The specimens are fixed in 10% Neutral Formalin for at least 6 hours and up to 72hrs. These tests are validated on decalcified tissue. Results should be interpreted with caution given the possibility of false negative results on decalcified specimens. Antibody Clones are as follows ER-clone [id number], PR-clone 16, Ki67- clone MM1. Some of these immunohistochemical stains may have been developed and the performance characteristics determined by [facility].   Glucose, capillary   Collection Time: 11/26/23  4:45 PM  Result Value Ref Range   Glucose-Capillary 166 (H) 70 - 99 mg/dL  Glucose, capillary   Collection Time: 11/26/23  6:11 PM  Result Value Ref Range   Glucose-Capillary 165 (H) 70 - 99 mg/dL  Glucose, capillary   Collection Time: 11/27/23 12:03 AM  Result Value Ref Range   Glucose-Capillary 179 (H) 70 - 99 mg/dL  Glucose, capillary   Collection Time: 11/27/23  8:43 AM  Result Value Ref Range   Glucose-Capillary 202 (H) 70 - 99 mg/dL    Discharge Medications:   Allergies as of 11/27/2023       Reactions   Azathioprine Other (See Comments)   LOWERS HEMOGLOBIN   Lyrica [pregabalin] Swelling, Rash    Other reaction(s): Other (See Comments) Swelling in legs with rash, chest pain   Neurontin [gabapentin] Other (See Comments)   Sadness, crying   Penicillins Other (See Comments)   Yeast infection 5 years ago   Aspirin Nausea Only   Zestril [lisinopril] Cough   Aloe Rash   Burn rash   Dilaudid  [hydromorphone ] Nausea And Vomiting   Lactose Diarrhea   Lactose Intolerance (gi) Nausea Only, Other (See Comments)   Latex Rash   sensitivity   Methotrexate Sodium Other (See Comments)   Due to Elevated liver enzymes   Morphine And Codeine Nausea And Vomiting, Diarrhea   Morphine Sulfate    Other reaction(s): GI Upset (intolerance)   Neomycin Rash   Other Swelling   All mycins-swelling   Prozac [fluoxetine] Rash, Other (See Comments)        Medication List     TAKE these medications    acetaminophen  500 MG tablet Commonly known as: TYLENOL  Take 2 tablets (1,000 mg total) by mouth every 6 (six) hours as needed.   albuterol  108 (90 Base) MCG/ACT inhaler Commonly known as: VENTOLIN  HFA Inhale 2 puffs into the lungs daily. Midday to see if improves breakthrough cough. Airsupra  was denied. What changed:  when to take this reasons to take this   allopurinol  300 MG tablet Commonly known as: ZYLOPRIM  TAKE 1 TABLET BY MOUTH EVERY DAY   ALPRAZolam  0.25 MG tablet Commonly known as: XANAX   Take 0.25 mg by mouth 3 (three) times daily as needed.   amLODipine  5 MG tablet Commonly known as: NORVASC  Take 1 tablet (5 mg total) by mouth daily.   bacitracin 500 UNIT/GM ointment Apply 1 Application topically 2 (two) times daily for 7 days.   CALTRATE 600+D PO Take 1 tablet by mouth 2 (two) times daily.   carvedilol  6.25 MG tablet Commonly known as: COREG  TAKE 1 TABLET BY MOUTH TWICE A DAY WITH FOOD   clindamycin  1 % lotion Commonly known as: CLEOCIN  T Apply 1 Application topically 2 (two) times daily as needed (skin irritation.).   clindamycin  300 MG capsule Commonly known  as: Cleocin  Take 1 capsule (300 mg total) by mouth 3 (three) times daily for 5 days.   colestipol 1 g tablet Commonly known as: COLESTID Take 1 g by mouth 3 (three) times daily as needed (diarrhea).   diphenhydramine -acetaminophen  25-500 MG Tabs tablet Commonly known as: TYLENOL  PM Take 2 tablets by mouth at bedtime as needed.   DULoxetine  60 MG capsule Commonly known as: CYMBALTA  Take 1 capsule (60 mg total) by mouth daily. What changed: when to take this   ferrous sulfate 325 (65 FE) MG tablet Take 325 mg by mouth daily with lunch.   furosemide  20 MG tablet Commonly known as: LASIX  TAKE 1 TABLET DAILY AND EXTRA TABLET OF DAYS OF WEIGHT GAIN OF 3 LBS OVERNIGHT OR 5 LBS IN 1 WK.   Klor-Con  M20 20 MEQ tablet Generic drug: potassium chloride  SA TAKE 1 TABLET BY MOUTH EVERY DAY What changed: when to take this   loratadine 10 MG tablet Commonly known as: CLARITIN Take 10 mg by mouth in the morning.   losartan  100 MG tablet Commonly known as: COZAAR  Take 0.5 tablets (50 mg total) by mouth daily.   metFORMIN  1000 MG tablet Commonly known as: GLUCOPHAGE  TAKE 1 TABLET (1,000 MG TOTAL) BY MOUTH TWICE A DAY WITH FOOD   oxyCODONE  5 MG immediate release tablet Commonly known as: Roxicodone  Take 1 tablet (5 mg total) by mouth every 4 (four) hours as needed for up to 7 days.   pantoprazole  40 MG tablet Commonly known as: PROTONIX  Take 40 mg by mouth 2 (two) times daily.   Restasis 0.05 % ophthalmic emulsion Generic drug: cycloSPORINE Place 1 drop into both eyes in the morning and at bedtime.   rosuvastatin  10 MG tablet Commonly known as: CRESTOR  Take 0.5 tablets (5 mg total) by mouth daily.   sertraline  100 MG tablet Commonly known as: ZOLOFT  TAKE 2 TABLETS BY MOUTH AT BEDTIME.   silver sulfADIAZINE 1 % cream Commonly known as: SILVADENE Apply 1 Application topically daily as needed (skin flares.).   UNABLE TO FIND Med Name Cpap at night   Vitamin D3 25 MCG (1000  UT) Caps Take 1,000 Units by mouth daily with lunch.   Wixela Inhub 500-50 MCG/ACT Aepb Generic drug: fluticasone -salmeterol INHALE 1 PUFF INTO THE LUNGS IN THE MORNING AND AT BEDTIME.               Discharge Care Instructions  (From admission, onward)           Start     Ordered   11/27/23 0000  Discharge wound care:       Comments: See d/c instructions   11/27/23 9277            Diagnostic Studies: No results found.  Disposition: Discharge disposition: 01-Home or Self Care       Discharge  Instructions     Diet Carb Modified   Complete by: As directed    Discharge wound care:   Complete by: As directed    See d/c instructions   Increase activity slowly   Complete by: As directed         Follow-up Information     Billy Philippe SAUNDERS, NP. Call today.   Specialty: Family Medicine Contact information: [id number] [place] [address] [phone]                  Signed: Eldora KATHEE Blanch 11/28/2023, 3:02 PM

## 2023-11-29 ENCOUNTER — Encounter (INDEPENDENT_AMBULATORY_CARE_PROVIDER_SITE_OTHER): Payer: Self-pay | Admitting: Otolaryngology

## 2023-11-29 ENCOUNTER — Ambulatory Visit (INDEPENDENT_AMBULATORY_CARE_PROVIDER_SITE_OTHER): Admitting: Otolaryngology

## 2023-11-29 VITALS — BP 148/69 | HR 92 | Ht 63.0 in | Wt 263.0 lb

## 2023-11-29 DIAGNOSIS — K118 Other diseases of salivary glands: Secondary | ICD-10-CM

## 2023-11-29 NOTE — Progress Notes (Signed)
 ENT Post-op visit: S/p left parotidectomy and left CLNBx 11/26/2023. Patient returns for follow up. S: Doing well. Expected pain, no fevers, no bleeding. Incision expected numbness but otherwise no significant swelling. Drain with 30cc ss output over past 48 hours total, removed. No dysphagia, no other issues with incision. Facial nerve function intact. We discussed path  O:  CN II-XII intact - no facial weakness in any CN 7 distribution; Incision c/d/I, no evidence of hematoma; drain with ss output, removed  A/P:  Doing well after excision. Drain removed. Ok to bathe after drain defect closes. Incision care with vaseline Return precautions discussed F/u PRN

## 2023-11-30 ENCOUNTER — Encounter (INDEPENDENT_AMBULATORY_CARE_PROVIDER_SITE_OTHER): Admitting: Otolaryngology

## 2023-12-01 ENCOUNTER — Other Ambulatory Visit: Payer: Self-pay | Admitting: Family Medicine

## 2023-12-04 ENCOUNTER — Telehealth (INDEPENDENT_AMBULATORY_CARE_PROVIDER_SITE_OTHER): Payer: Self-pay

## 2023-12-04 ENCOUNTER — Telehealth (INDEPENDENT_AMBULATORY_CARE_PROVIDER_SITE_OTHER): Payer: Self-pay | Admitting: Otolaryngology

## 2023-12-04 MED ORDER — FLUCONAZOLE 150 MG PO TABS: 150.0000 mg | ORAL_TABLET | Freq: Every day | ORAL | 0 refills | Status: AC

## 2023-12-04 NOTE — Telephone Encounter (Signed)
 Pt husband called LVM stating the antibiotic you prescribed her after the surgery his wife believes gave her a yeast infection. They would like a call back for some guidance on what to do.

## 2023-12-04 NOTE — Telephone Encounter (Signed)
 I prescribed her fluconazole  for 3 days. She should start taking that [name]

## 2023-12-04 NOTE — Telephone Encounter (Signed)
 The patient's husband called in and reported that the patient has developed a yeast infection and is requesting medication sent into [facility] [place] please.

## 2023-12-04 NOTE — Telephone Encounter (Signed)
 Called spoke with patients husband let him know the prescription was sent in he understood and had no further questions

## 2023-12-04 NOTE — Addendum Note (Signed)
 Addended by: Yusef Lamp on: 12/04/2023 12:18 PM   Modules accepted: Orders

## 2023-12-05 ENCOUNTER — Ambulatory Visit

## 2023-12-13 ENCOUNTER — Other Ambulatory Visit: Payer: Self-pay | Admitting: Medical Genetics

## 2023-12-13 DIAGNOSIS — Z006 Encounter for examination for normal comparison and control in clinical research program: Secondary | ICD-10-CM

## 2023-12-17 ENCOUNTER — Ambulatory Visit (INDEPENDENT_AMBULATORY_CARE_PROVIDER_SITE_OTHER)

## 2023-12-17 VITALS — Ht 63.0 in | Wt 263.0 lb

## 2023-12-17 DIAGNOSIS — Z0000 Encounter for general adult medical examination without abnormal findings: Secondary | ICD-10-CM

## 2023-12-17 NOTE — Patient Instructions (Addendum)
 [name],  Thank you for taking the time for your Medicare Wellness Visit. I appreciate your continued commitment to your health goals. Please review the care plan we discussed, and feel free to reach out if I can assist you further.  Please note that Annual Wellness Visits do not include a physical exam. Some assessments may be limited, especially if the visit was conducted virtually. If needed, we may recommend an in-person follow-up with your provider.  Ongoing Care Seeing your primary care provider every 3 to 6 months helps us  monitor your health and provide consistent, personalized care.   Referrals If a referral was made during today's visit and you haven't received any updates within two weeks, please contact the referred provider directly to check on the status.  Recommended Screenings:  Health Maintenance  Topic Date Due   Flu Shot  09/14/2023   COVID-19 Vaccine (7 - 2025-26 season) 10/15/2023   Hemoglobin A1C  05/11/2024   Eye exam for diabetics  11/01/2024   Yearly kidney health urinalysis for diabetes  11/11/2024   Complete foot exam   11/11/2024   Yearly kidney function blood test for diabetes  11/21/2024   Medicare Annual Wellness Visit  12/16/2024   Breast Cancer Screening  03/26/2025   Colon Cancer Screening  06/26/2026   DTaP/Tdap/Td vaccine (2 - Td or Tdap) 12/11/2032   Pneumococcal Vaccine for age over 46  Completed   DEXA scan (bone density measurement)  Completed   Hepatitis C Screening  Completed   Zoster (Shingles) Vaccine  Completed   Meningitis B Vaccine  Aged Out       12/17/2023    2:00 PM  Advanced Directives  Does Patient Have a Medical Advance Directive? Yes  Type of Estate Agent of [place];Living will  Copy of Healthcare Power of Attorney in Chart? No - copy requested    Vision: Annual vision screenings are recommended for early detection of glaucoma, cataracts, and diabetic retinopathy. These exams can also reveal  signs of chronic conditions such as diabetes and high blood pressure.  Dental: Annual dental screenings help detect early signs of oral cancer, gum disease, and other conditions linked to overall health, including heart disease and diabetes.
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
## 2023-12-20 ENCOUNTER — Other Ambulatory Visit: Payer: Self-pay | Admitting: Neurology

## 2023-12-20 ENCOUNTER — Telehealth: Payer: Self-pay | Admitting: *Deleted

## 2023-12-20 ENCOUNTER — Other Ambulatory Visit: Payer: Self-pay | Admitting: Family Medicine

## 2023-12-20 NOTE — Telephone Encounter (Signed)
 Spouse called to inquire about DULoxetine  being filled, message from RN was that he needs to reach out to pt's pcp for future refills, spouse will reach out.

## 2023-12-20 NOTE — Telephone Encounter (Signed)
 Last seen on 12/12/22 per note  Return if symptoms worsen or fail to improve.  Follow up scheduled on

## 2023-12-20 NOTE — Telephone Encounter (Signed)
 Copied from CRM [id number]. Topic: Clinical - Medication Question >> Dec 20, 2023  3:50 PM Aisha D wrote: Reason for CRM: Pt's husband Timmy called regarding the DULoxetine  (CYMBALTA ) 60 MG capsule. Timmy stated that the pt would like to know if Philippe Slade, NP, is able to write a new prescription for this medication and would like a callback with an update.

## 2023-12-21 ENCOUNTER — Other Ambulatory Visit: Payer: Self-pay

## 2023-12-21 DIAGNOSIS — M545 Low back pain, unspecified: Secondary | ICD-10-CM

## 2023-12-21 MED ORDER — DULOXETINE HCL 60 MG PO CPEP: 60.0000 mg | ORAL_CAPSULE | Freq: Every day | ORAL | 3 refills | Status: AC

## 2023-12-21 NOTE — Telephone Encounter (Signed)
 Notified pt medication refilled.

## 2023-12-29 ENCOUNTER — Other Ambulatory Visit: Payer: Self-pay | Admitting: Cardiology

## 2023-12-29 ENCOUNTER — Ambulatory Visit (HOSPITAL_BASED_OUTPATIENT_CLINIC_OR_DEPARTMENT_OTHER)
Admission: RE | Admit: 2023-12-29 | Discharge: 2023-12-29 | Disposition: A | Source: Ambulatory Visit | Attending: Family Medicine

## 2023-12-29 ENCOUNTER — Other Ambulatory Visit: Payer: Self-pay | Admitting: Family Medicine

## 2023-12-29 ENCOUNTER — Other Ambulatory Visit: Payer: Self-pay | Admitting: Pulmonary Disease

## 2023-12-29 DIAGNOSIS — N2889 Other specified disorders of kidney and ureter: Secondary | ICD-10-CM | POA: Insufficient documentation

## 2023-12-29 DIAGNOSIS — N289 Disorder of kidney and ureter, unspecified: Secondary | ICD-10-CM | POA: Diagnosis not present

## 2023-12-29 DIAGNOSIS — K760 Fatty (change of) liver, not elsewhere classified: Secondary | ICD-10-CM | POA: Diagnosis not present

## 2023-12-29 DIAGNOSIS — N281 Cyst of kidney, acquired: Secondary | ICD-10-CM | POA: Diagnosis not present

## 2023-12-29 DIAGNOSIS — K5730 Diverticulosis of large intestine without perforation or abscess without bleeding: Secondary | ICD-10-CM | POA: Diagnosis not present

## 2023-12-29 MED ORDER — GADOBUTROL 1 MMOL/ML IV SOLN
10.0000 mL | Freq: Once | INTRAVENOUS | Status: AC | PRN
  Administered 2023-12-29: 10 mL via INTRAVENOUS
  Filled 2023-12-29: qty 10

## 2024-01-03 ENCOUNTER — Other Ambulatory Visit: Payer: Self-pay | Admitting: Cardiology

## 2024-01-06 ENCOUNTER — Other Ambulatory Visit: Payer: Self-pay | Admitting: Family Medicine

## 2024-01-11 LAB — GENECONNECT MOLECULAR SCREEN: Genetic Analysis Overall Interpretation: NEGATIVE

## 2024-01-12 ENCOUNTER — Other Ambulatory Visit: Payer: Self-pay | Admitting: Cardiology

## 2024-02-02 ENCOUNTER — Other Ambulatory Visit: Payer: Self-pay | Admitting: Cardiology

## 2024-02-11 ENCOUNTER — Telehealth (INDEPENDENT_AMBULATORY_CARE_PROVIDER_SITE_OTHER): Payer: Self-pay | Admitting: Otolaryngology

## 2024-02-11 NOTE — Telephone Encounter (Signed)
 The patient's husband called in requesting medication be sent in to [facility] in [place]. He reports she has an ear infection.  Please advise. [phone].

## 2024-02-12 ENCOUNTER — Other Ambulatory Visit: Payer: Self-pay | Admitting: Cardiology

## 2024-02-18 ENCOUNTER — Ambulatory Visit (INDEPENDENT_AMBULATORY_CARE_PROVIDER_SITE_OTHER): Admitting: Otolaryngology
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
## 2024-02-19 ENCOUNTER — Ambulatory Visit: Admitting: Cardiology

## 2024-02-21 ENCOUNTER — Ambulatory Visit: Attending: Cardiology | Admitting: Cardiology

## 2024-02-21 ENCOUNTER — Encounter: Payer: Self-pay | Admitting: Cardiology

## 2024-02-21 VITALS — BP 142/82 | HR 78 | Ht 63.0 in | Wt 268.0 lb

## 2024-02-21 DIAGNOSIS — I2510 Atherosclerotic heart disease of native coronary artery without angina pectoris: Secondary | ICD-10-CM

## 2024-02-21 DIAGNOSIS — I10 Essential (primary) hypertension: Secondary | ICD-10-CM

## 2024-02-21 DIAGNOSIS — G4733 Obstructive sleep apnea (adult) (pediatric): Secondary | ICD-10-CM

## 2024-02-21 DIAGNOSIS — R0609 Other forms of dyspnea: Secondary | ICD-10-CM

## 2024-02-21 MED ORDER — LOSARTAN POTASSIUM 100 MG PO TABS: 50.0000 mg | ORAL_TABLET | Freq: Every day | ORAL | 3 refills | Status: AC

## 2024-02-21 MED ORDER — CARVEDILOL 6.25 MG PO TABS: 6.2500 mg | ORAL_TABLET | Freq: Two times a day (BID) | ORAL | 3 refills | Status: AC

## 2024-02-21 MED ORDER — AMLODIPINE BESYLATE 5 MG PO TABS: 5.0000 mg | ORAL_TABLET | Freq: Every day | ORAL | 3 refills | Status: AC

## 2024-02-21 MED ORDER — FUROSEMIDE 20 MG PO TABS: 20.0000 mg | ORAL_TABLET | Freq: Every day | ORAL | 3 refills | Status: AC

## 2024-02-21 NOTE — Patient Instructions (Addendum)

## 2024-02-24 ENCOUNTER — Other Ambulatory Visit: Payer: Self-pay | Admitting: Family Medicine

## 2024-03-13 ENCOUNTER — Other Ambulatory Visit: Payer: Self-pay | Admitting: Family Medicine

## 2024-03-13 DIAGNOSIS — Z1231 Encounter for screening mammogram for malignant neoplasm of breast: Secondary | ICD-10-CM

## 2024-03-27 ENCOUNTER — Ambulatory Visit

## 2024-05-12 ENCOUNTER — Ambulatory Visit: Admitting: Family Medicine

## 2024-12-19 ENCOUNTER — Ambulatory Visit

## 2024-12-19 ENCOUNTER — Encounter: Admitting: Family Medicine
